# Patient Record
Sex: Male | Born: 1935 | Race: White | Hispanic: No | Smoking: Former smoker
Health system: Southern US, Community
[De-identification: ages and names within clinical notes are randomized; demographics above are authoritative.]

## PROBLEM LIST (undated history)

## (undated) DIAGNOSIS — IMO0001 Reserved for inherently not codable concepts without codable children: Secondary | ICD-10-CM

## (undated) DIAGNOSIS — I350 Nonrheumatic aortic (valve) stenosis: Secondary | ICD-10-CM

## (undated) DIAGNOSIS — M1990 Unspecified osteoarthritis, unspecified site: Secondary | ICD-10-CM

## (undated) DIAGNOSIS — L03116 Cellulitis of left lower limb: Secondary | ICD-10-CM

## (undated) DIAGNOSIS — I219 Acute myocardial infarction, unspecified: Secondary | ICD-10-CM

## (undated) DIAGNOSIS — Z9289 Personal history of other medical treatment: Secondary | ICD-10-CM

## (undated) DIAGNOSIS — R011 Cardiac murmur, unspecified: Secondary | ICD-10-CM

## (undated) DIAGNOSIS — M79606 Pain in leg, unspecified: Secondary | ICD-10-CM

## (undated) DIAGNOSIS — G709 Myoneural disorder, unspecified: Secondary | ICD-10-CM

## (undated) DIAGNOSIS — K439 Ventral hernia without obstruction or gangrene: Secondary | ICD-10-CM

## (undated) DIAGNOSIS — R351 Nocturia: Secondary | ICD-10-CM

## (undated) DIAGNOSIS — N400 Enlarged prostate without lower urinary tract symptoms: Secondary | ICD-10-CM

## (undated) DIAGNOSIS — E785 Hyperlipidemia, unspecified: Secondary | ICD-10-CM

## (undated) DIAGNOSIS — K269 Duodenal ulcer, unspecified as acute or chronic, without hemorrhage or perforation: Secondary | ICD-10-CM

## (undated) DIAGNOSIS — L03032 Cellulitis of left toe: Secondary | ICD-10-CM

## (undated) DIAGNOSIS — K219 Gastro-esophageal reflux disease without esophagitis: Secondary | ICD-10-CM

## (undated) DIAGNOSIS — K432 Incisional hernia without obstruction or gangrene: Secondary | ICD-10-CM

## (undated) DIAGNOSIS — I2510 Atherosclerotic heart disease of native coronary artery without angina pectoris: Secondary | ICD-10-CM

## (undated) HISTORY — PX: APPENDECTOMY: SHX54

## (undated) HISTORY — PX: PROSTATE BIOPSY: SHX241

## (undated) HISTORY — PX: TONSILLECTOMY: SUR1361

## (undated) HISTORY — DX: Nonrheumatic aortic (valve) stenosis: I35.0

## (undated) HISTORY — PX: ABDOMINAL EXPLORATION SURGERY: SHX538

## (undated) HISTORY — PX: CAROTID ENDARTERECTOMY: SUR193

## (undated) HISTORY — DX: Atherosclerotic heart disease of native coronary artery without angina pectoris: I25.10

## (undated) HISTORY — PX: CATARACT EXTRACTION W/ INTRAOCULAR LENS  IMPLANT, BILATERAL: SHX1307

## (undated) HISTORY — DX: Hyperlipidemia, unspecified: E78.5

---
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
## 1970-05-26 HISTORY — PX: CHOLECYSTECTOMY: SHX55

## 1998-04-10 ENCOUNTER — Ambulatory Visit (HOSPITAL_COMMUNITY): Admission: RE | Admit: 1998-04-10 | Discharge: 1998-04-10 | Payer: Self-pay | Admitting: General Surgery

## 2001-05-26 HISTORY — PX: CORONARY ARTERY BYPASS GRAFT: SHX141

## 2003-04-12 ENCOUNTER — Ambulatory Visit (HOSPITAL_COMMUNITY): Admission: RE | Admit: 2003-04-12 | Discharge: 2003-04-12 | Payer: Self-pay | Admitting: *Deleted

## 2003-04-12 ENCOUNTER — Encounter (INDEPENDENT_AMBULATORY_CARE_PROVIDER_SITE_OTHER): Payer: Self-pay | Admitting: Specialist

## 2003-06-12 ENCOUNTER — Encounter: Admission: RE | Admit: 2003-06-12 | Discharge: 2003-06-20 | Payer: Self-pay | Admitting: Family Medicine

## 2004-04-01 ENCOUNTER — Encounter (INDEPENDENT_AMBULATORY_CARE_PROVIDER_SITE_OTHER): Payer: Self-pay | Admitting: *Deleted

## 2004-04-02 ENCOUNTER — Inpatient Hospital Stay (HOSPITAL_COMMUNITY): Admission: AD | Admit: 2004-04-02 | Discharge: 2004-04-07 | Payer: Self-pay | Admitting: Cardiology

## 2004-04-03 ENCOUNTER — Encounter (INDEPENDENT_AMBULATORY_CARE_PROVIDER_SITE_OTHER): Payer: Self-pay | Admitting: Cardiology

## 2004-07-05 ENCOUNTER — Ambulatory Visit: Payer: Self-pay | Admitting: Internal Medicine

## 2004-08-12 ENCOUNTER — Ambulatory Visit: Payer: Self-pay | Admitting: Internal Medicine

## 2005-11-06 ENCOUNTER — Inpatient Hospital Stay (HOSPITAL_COMMUNITY): Admission: AD | Admit: 2005-11-06 | Discharge: 2005-11-23 | Payer: Self-pay | Admitting: Internal Medicine

## 2007-03-16 IMAGING — CR DG ABDOMEN 2V
2 series · 2 of 2 positions shown · non-contrast
Comparison: 11/09/05.

CLINICAL DATA: 77-year-old with nausea vomiting and dehydration.  Abdominal pain.
 ABDOMEN ? 2 VIEW:

[w abdomen upright]
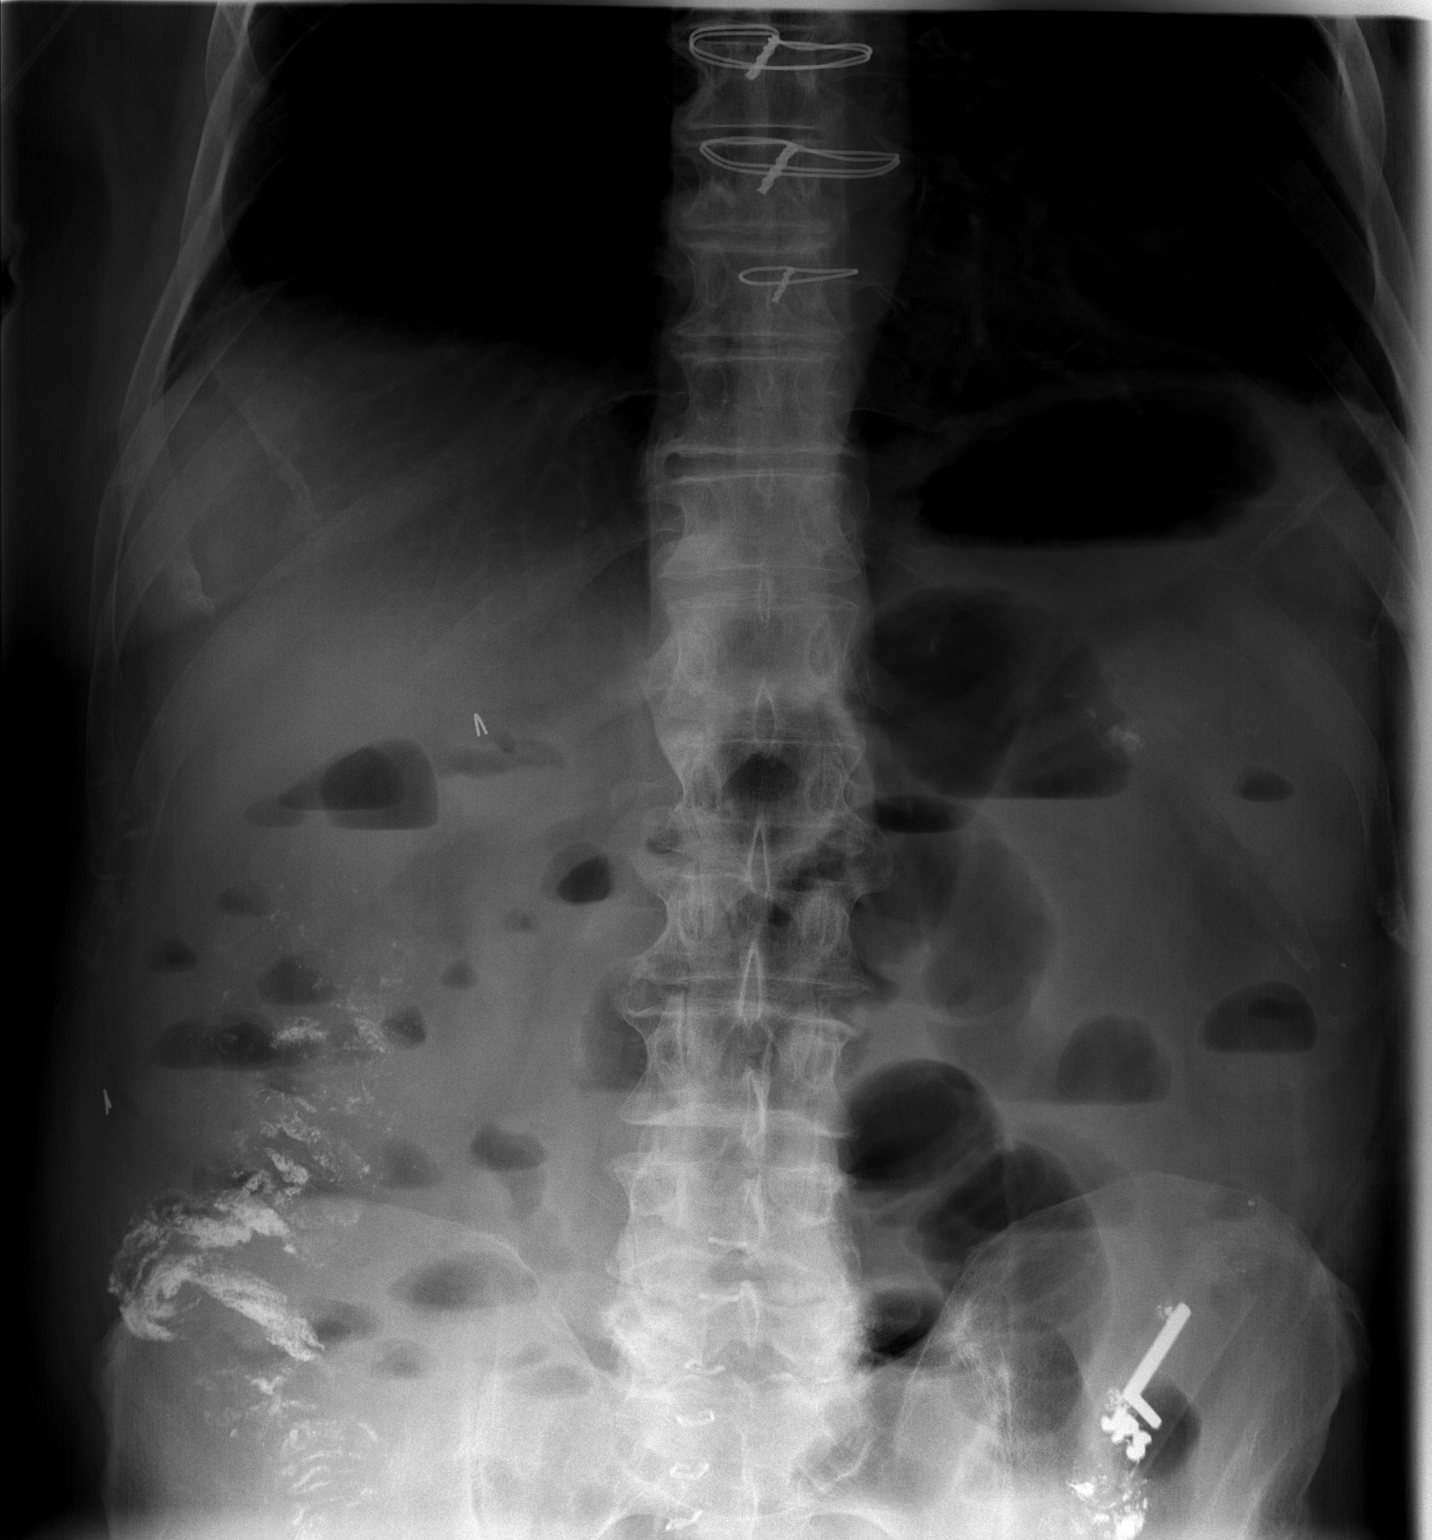

[t abdomen supine]
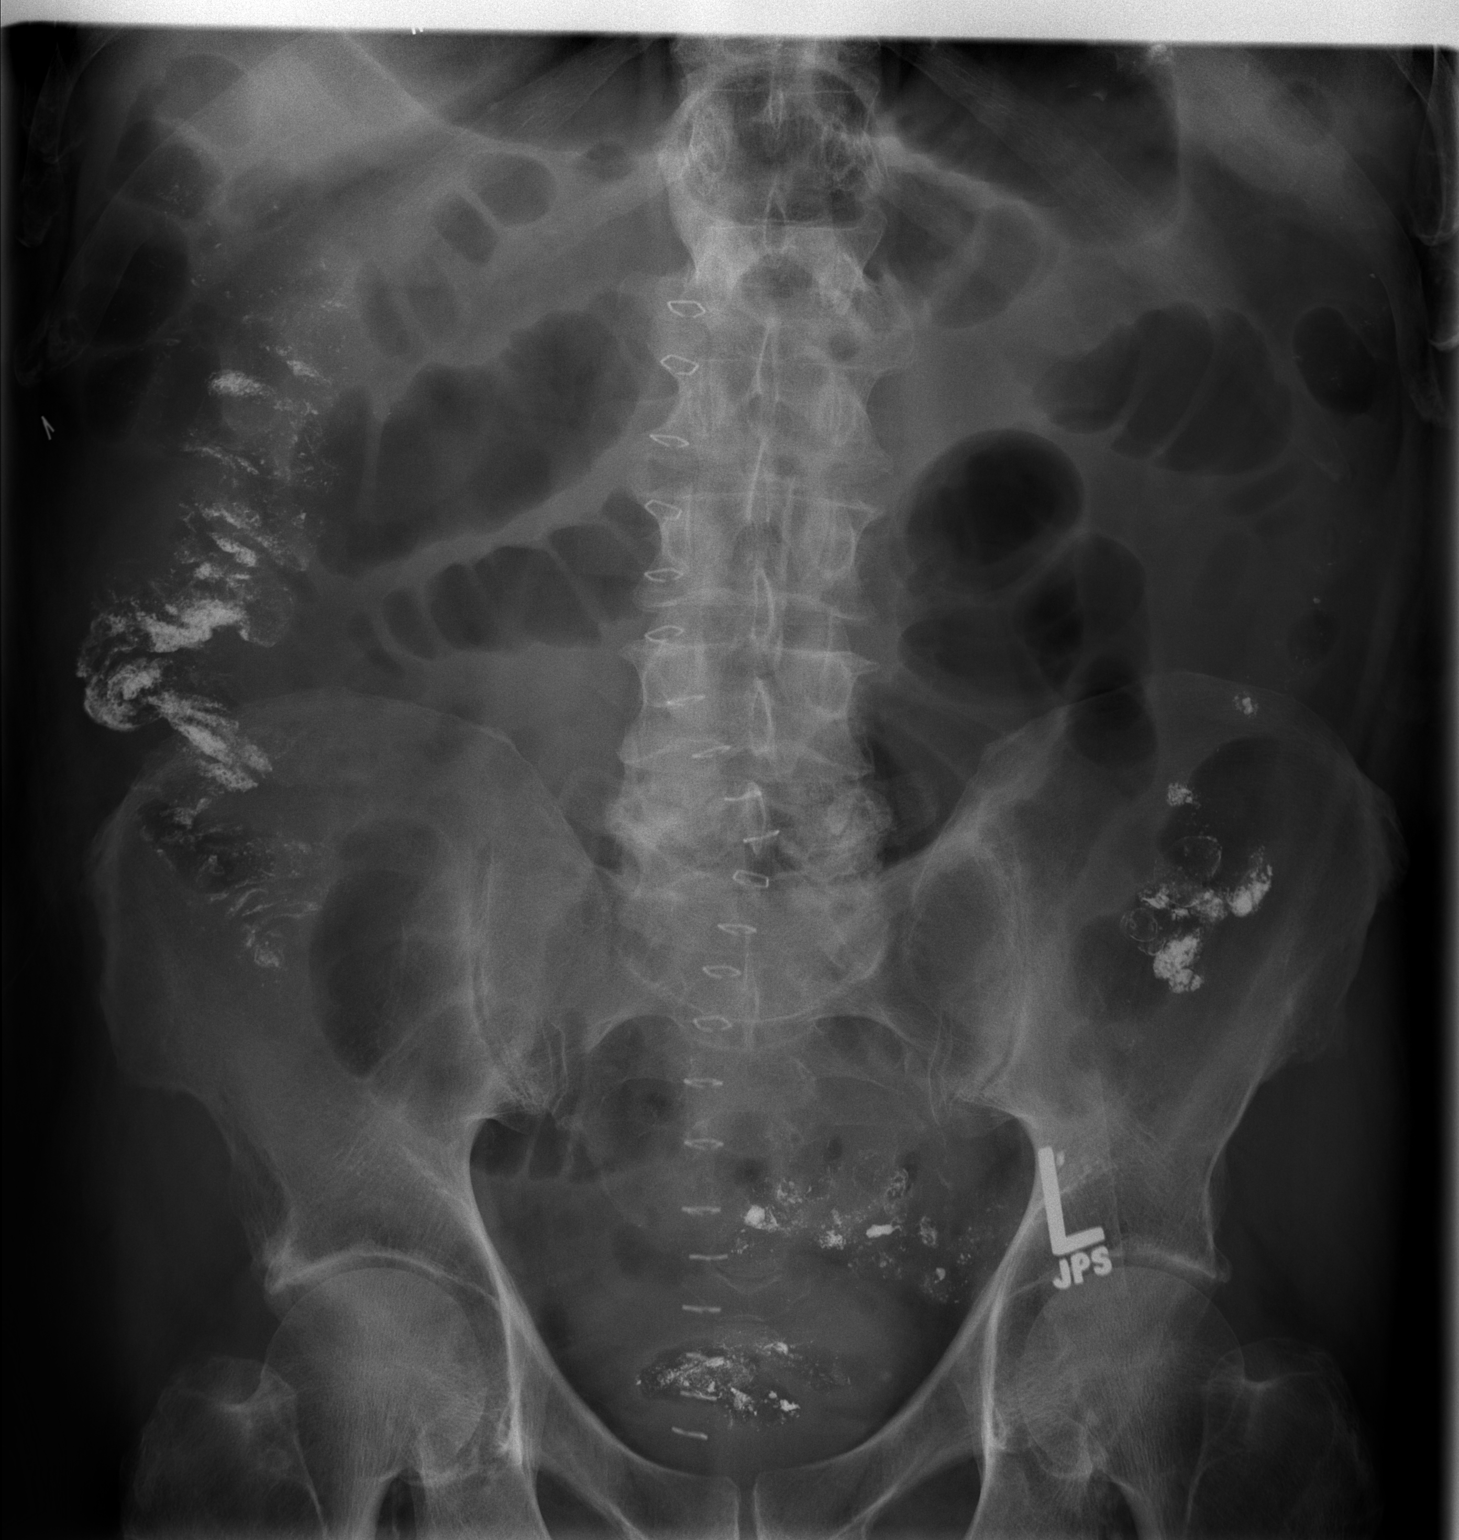

[2 of 2 positions shown; findings below may reference images not displayed]

FINDINGS: There is scattered residual contrast in the colon from a barium enema.  There are air-filled loops of small bowel with air fluid levels.  No significant distention but findings suspicious for an early small bowel obstruction.  No free air.
IMPRESSION: Air-filled loops of small bowel with air fluid levels suggesting early small bowel obstruction.

## 2007-03-18 IMAGING — CR DG ABDOMEN 2V
2 series · 2 of 2 positions shown · non-contrast
Comparison: 11/19/05.

CLINICAL DATA: Nausea, vomiting and diarrhea.  
 ABDOMEN ? 2 VIEW:

[w abdomen upright]
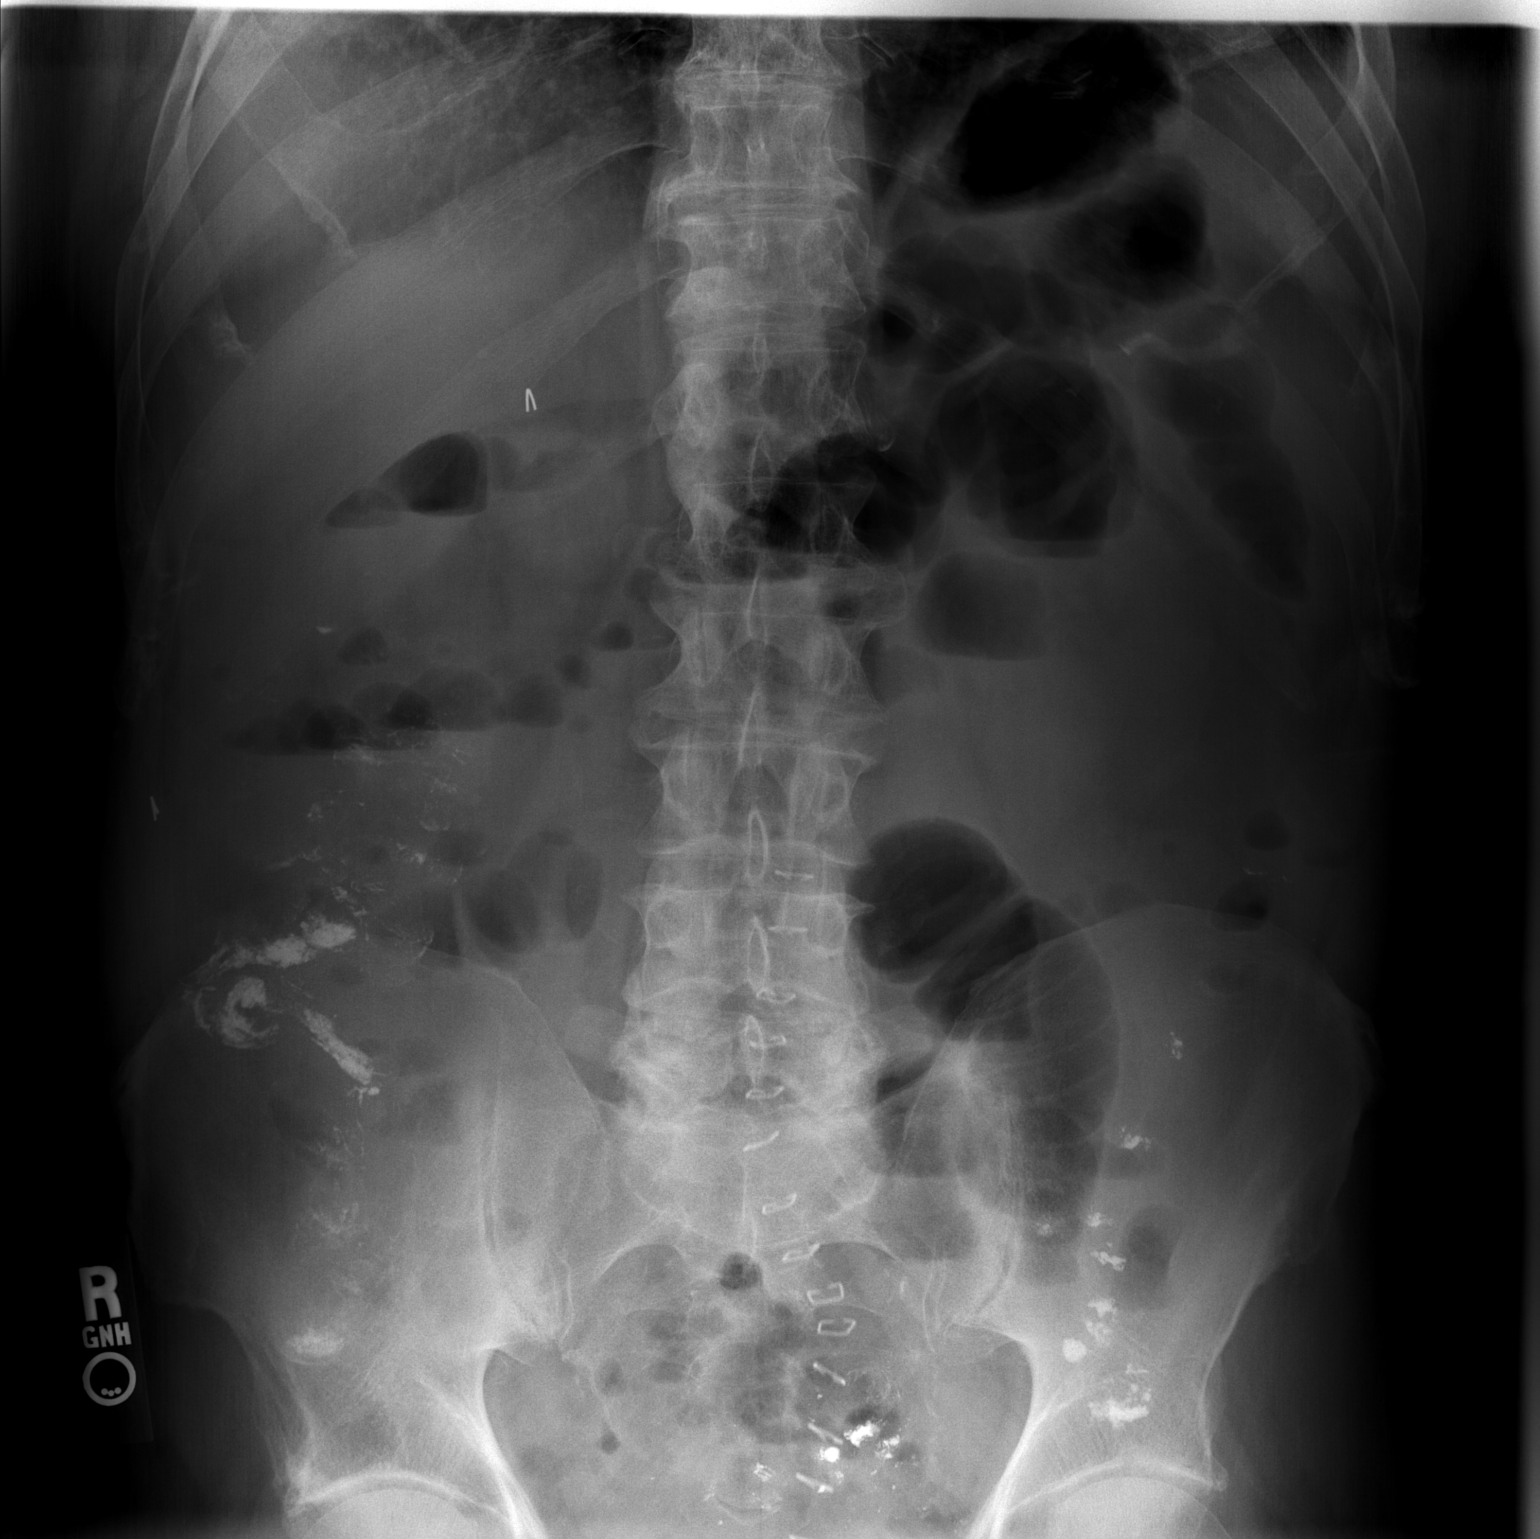

[t abdomen supine]
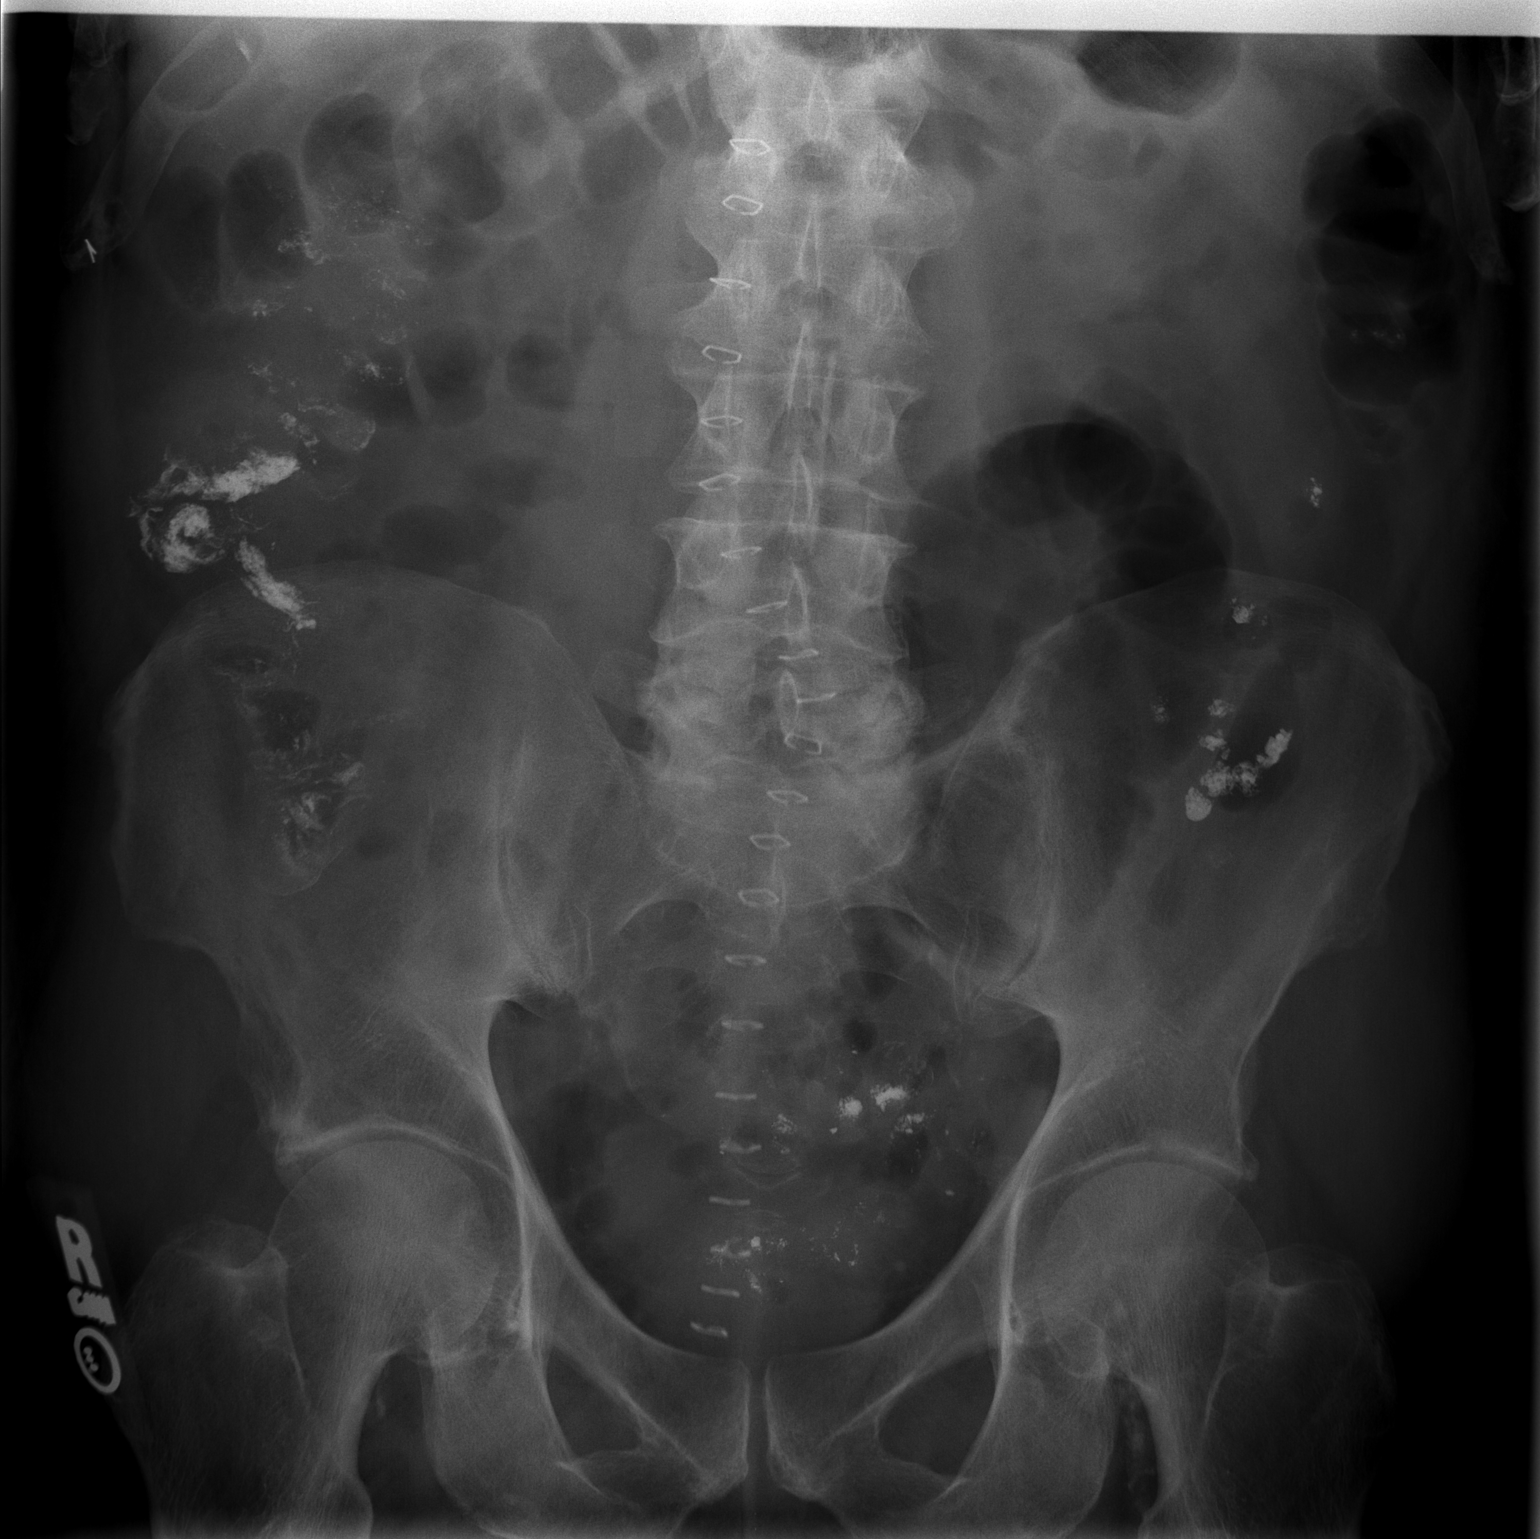

[2 of 2 positions shown; findings below may reference images not displayed]

FINDINGS: Dilated small bowel loops suggestive of small bowel obstruction on the prior exam of 11/19/05.
 There are some persistent slightly dilated small bowel loops in the midabdomen.  There are also some small air fluid levels in the nondistended colon.  Barium persists in distal small bowel and in the colon, unchanged.  This appearance is more typical for an ileus, given the lack of movement of the contrast in the nondistended colon.
IMPRESSION: Probable ileus.

## 2008-12-06 ENCOUNTER — Encounter: Admission: RE | Admit: 2008-12-06 | Discharge: 2009-01-09 | Payer: Self-pay | Admitting: Family Medicine

## 2010-10-11 NOTE — Consult Note (Signed)
NAMECAMAR, GUYTON                 ACCOUNT NO.:  [id number]   MEDICAL RECORD NO.:  [id number]          PATIENT TYPE:  INP   LOCATION:  6738                         FACILITY:  [facility]   PHYSICIAN:  Clovis Pu. Cornett, M.D.DATE OF BIRTH:  04/24/1929   DATE OF CONSULTATION:  11/08/2005  DATE OF DISCHARGE:                                   CONSULTATION   CHIEF COMPLAINT:  Abdominal pain, nausea and vomiting.   REASON FOR CONSULTATION:  Abdominal pain, nausea and vomiting, bowel  obstruction.   HISTORY OF PRESENT ILLNESS:  The patient is a 77 year old male admitted on  November 06, 2005 due to a 1 week history of nausea, vomiting, and obstipation.  The patient denies any significant abdominal pain but does have significant  distention with nausea and vomiting. He was found to have findings  concerning for a partial small bowel obstruction. I was asked to see the  patient at the request of Dr. Nehemiah Settle for this. Currently, he denies any  abdominal pain. His biggest complaint is no bowel movement for over 2 weeks  he states. His son is with him today and states that he has been a little  more confused in the hospital and that at baseline, he is lucid and very  active. He denies any abdominal pain to me today. He denies any passage of  flatus. NG tube is in place and there is bilious drainage from it. The  nausea and vomiting are made better by the NG tube. There is no associated  symptom with it. NG tube is also helping with that.   PAST MEDICAL HISTORY:  He has a past history of CABG one year ago,  dyslipidemia, and hypertension.   PAST SURGICAL HISTORY:  1.  CABG.  2.  History of open cholecystectomy.  3.  History of a periumbilical hernia, not operated on.   ADMISSION MEDICATIONS:  Include Lipitor, Doxazosin and [place] patch.   SOCIAL HISTORY:  Denies tobacco use. Does drink at least 2 ounces a day.  Denies any drug use. His son is with him today.   REVIEW OF SYSTEMS:  Positive for  nausea and vomiting. Denies any abdominal  pain. Otherwise, general review of systems is negative.   FAMILY HISTORY:  Positive for heart disease.   ALLERGIES:  NO KNOWN DRUG ALLERGIES.   PHYSICAL EXAMINATION:  VITAL SIGNS:  Temperature 97.4, blood pressure  126/81, heart rate 80. Urine output is not recorded.  GENERAL:  White male in no apparent distress.  HEENT:  The patient is wearing glasses. No evidence of scleral icterus.  Oropharynx, has a nasogastric tube in his nose. Moist mucous membranes.  NECK:  Supple, non-tender. Trachea midline. No mass.  CHEST:  Clear to auscultation. Chest wall motion is normal.  CARDIOVASCULAR:  Regular rate and rhythm. Without murmur, rub, or gallop.  Peripheral perfusion is good.  ABDOMEN:  Distended, tympanitic. No rebound, no guarding, no mass lesion, no  evidence of hernia around the umbilicus. Previous upper abdominal scar  noted. Bowel sounds are absent.  GENITOURINARY/RECTAL:  Deferred currently.  EXTREMITIES:  Muscle  tone normal. Range of motion  is normal.  NEUROLOGIC:  The patient seems slightly confused but knows where he is. He  is able to answer questions appropriately, though. Motor and sensory  functions are grossly intact.   DIAGNOSTIC STUDIES:  I reviewed his flat plate films of his abdomen from  yesterday and today. There is some mild small bowel dilatation. Otherwise,  there is stool and air in the colon on both films.   LABORATORY DATA:  CBC from today reveals a white count of 7,200. Hemoglobin  15. Platelet count 269,000. CMP revealed a creatinine of 1.6, bilirubin of  1.8, alkaline phosphatase 61. AST and ALT of 51 and 80. Chloride 97, CO2 28,  glucose 105, BUN 34, sodium 133, potassium 4.5.   IMPRESSION:  1.  Possible small bowel obstruction, versus partial obstruction, versus      colonic obstruction, versus obstipation.  2.  Acute renal failure secondary to above.   PLAN:  I agree with IV hydration and nasogastric  tube. He may require a  Foley catheter for better I's and O's. Strict I's and O's to see what his  input and output's are and a close watch of his electrolytes. I will review  his CT once it is complete today and make further decisions once that is  done. Otherwise, he has really no acute surgical indication for surgery,  unless something changes on his exam or CT scan shows something more  worrisome.   Thank you for this consultation.      Thomas A. Cornett, M.D.  Electronically Signed     TAC/MEDQ  D:  11/08/2005  T:  11/08/2005  Job:  [id number]   cc:   Deirdre Peer. Polite, M.D.

## 2010-10-11 NOTE — Discharge Summary (Signed)
NAMESNYDER, COLAVITO                 ACCOUNT NO.:  [id number]   MEDICAL RECORD NO.:  [id number]          PATIENT TYPE:  INP   LOCATION:  6738                         FACILITY:  [facility]   PHYSICIAN:  Leonie Man, M.D.   DATE OF BIRTH:  [birth year]   DATE OF ADMISSION:  11/06/2005  DATE OF DISCHARGE:  11/23/2005                                 DISCHARGE SUMMARY   OPERATIVE PHYSICIAN:  Dr. Abbey Chatters.   PRIMARY CARE PHYSICIAN:  Dr. Doran Clay   CHIEF COMPLAINT AND REASON FOR ADMISSION:  Jesus [name] is a 77 year old patient  with history of ischemia, coronary artery disease, followed by Dr. Armanda Magic, has history of recurrent CABG in __________ admitted with complaints  of __________.  These symptoms began this Saturday night prior to arrival.  He had multiple episodes of emesis and diarrhea.  No fevers or sick  contacts. He finally followed up with primary physician Monday, treated as  if he had a viral illness _________.  The patient continued with symptoms  and presented to the ER with complaints of feeling weak and light-headed .  At the ER, he was afebrile, vital signs were stable.  His exam was  unremarkable.  He was admitted with a interim  diagnosis of viral  gastroenteritis with associated vomiting versus __________.   HOSPITAL COURSE:  The patient was admitted to the __________ floor by  medicine services on the date noted, made n.p.o. and IV fluid hydration was  initiated.  His initial creatinine with fluid hydration was 6.5 down to  __________.  He had some mild transaminitis  with a normal total bilirubin, suggestive of volume depletion.  Plain x-rays  were obtained and showed a pattern consistent with a small bowel obstruction  and by November 08, 2005, this pattern continued.  The patient's abdomen  remained distended with diminished bowel sounds, tympanitic to percussion.  He still demonstrated mild transaminitis, but the LFTs had trended down and  stable.  He was not  having any abdominal pain, but was still having problems  with __________.  Because of his small bowel obstruction, surgical service  was consulted.  Dr. Luisa Hart saw the patient.  The patient reported to Dr.  Luisa Hart that he had a 2 week history of obstipation, nausea, and vomiting.  On his examination the patient had an NG tube in place with bilious  drainage.  His plain films were reviewed that showed air __________ colon  and some moderate bowel dilatation with normal white count at 7600.  CT scan  was pending at time of Dr. Rosezena Sensor evaluation.   A CT scan demonstrated dilated small bowel loops within the pelvis, signs of  severe sigmoid diverticulosis without evidence of diverticulitis.  There was  noted to be transition __________ in the right lower abdomen.  No mass or  inflammatory process was identified.  There was no free air or free fluid.  Because of these findings, a barium enema x-ray was ordered to clarify any  possible mass or other abnormalities.  There seemed to be colonic strictures  in the patient's colon,  possibly due to diverticulitis or cystic adhesion.  There were also 2 areas of apple core-like lesions that were suggestive of a  possible constrictive lesion in the mid right colon.  The patient otherwise  was stable.  His abdomen was distended.  NG tube now had coffee ground  emesis.  Prealbumin was checked and this was 8, and his LFTs had trended  back down to normal after hydration.  Because of the low prealbumin, TNA had  also been started, especially with concern that the patient would __________  because of the abnormal findings on CT and barium enema x-ray and __________  symptoms __________.   On _________, the patient continued to have significant NG output.  __________. Dr. Abbey Chatters sat down with the patient and his family and with  results of the patient's x-ray and agreed to proceed with exploratory  laparoscopy with expected bowel resection and  possible colostomy due to the  findings of the possible __________.  Risks and benefits of the procedure  were explained to the patient and they agreed.   On November 11, 2005, the patient was taken to the OR with a preoperative  diagnosis of bowel obstruction and postoperative diagnosis of bowel  obstruction and underwent an exploratory laparoscopy with lysis of  adhesions.  Despite the findings on barium enema x-ray  that was suggestive  of mass, Dr. Abbey Chatters with the assistance of Dr. Violeta Gelinas, were  unable to find any evidence of a large mass.  He underwent the operative  procedure, which included aggressive examination and palpation of the large  bowel.  Please see Dr. Maris Berger note for details.   Postoperatively the patient was slow to progress.  Essentially he developed  a postoperative ileus with leukocytosis without fever.  He was continued on  __________ IV for empiric antibiotic coverage.  By postop day 5, the  patient's abdomen was soft and flat, he had distant bowel sounds.  The night  before he had ambulated and had a liquid green bowel movement, so a trial  was __________ .  By postop day 6, the patient was tolerating __________  bowel movement.  He was started on a clear liquid diet with plans to advance  to full liquid diet that afternoon as tolerated.  The patient remained on  TNA.  Prealbumin _________.  On exam, abdomen was soft, bowel sounds  present.  __________.   By postop day 7, it was noted the patient had multiple episodes of emesis in  the night.  He still was having stools .  After talking with the patient's  son, the patient had eaten a large volume of full liquid diet and had become  nauseated __________,  The patient was placed on sips of clear liquid with  plans to slowly advance his diet.  He was still on TPA for pain control and was not changed over to oral pain medications until the patient was able to  tolerate a diet.  Abdominal x-ray was  also checked to make sure the patient  had not developed another ileus or small bowel obstruction.   By postop day 8, the patient's white count was 10,600, sodium had increased  to 132 , BUN was back down and creatinine stable.  His abdomen was  __________ soft with bowel sounds present.  __________.  The abdominal x-ray  shows a small amount of air fluid level.  Ileus had improved previous, was  continued on clear liquids for 24 hours.  By postop  day 9, the exam was  consistent with resolving ileus.  Abdomen remained soft, nondistended.  He  was having  bowel movements.  Bowel sounds were present.  He was advanced to  full liquid diet.  __________.  Because of the liquid stools, C. diff toxins  were checked to rule out infectious diarrhea.  C. diff was negative x3.  Repeat x-rays again showed air fluid levels, but decreasing in number and  degree of distention.   Over the next 2 days, the patient's diet was slowly advanced.  His exam  remained  unremarkable __________ and by November 23, 2005, which would be postop  day 11, the patient's exam was unremarkable.  He was tolerating a regular  diet __________ .  He was ambulating without difficulty.  He was afebrile  and __________ home.   FINAL DISCHARGE DIAGNOSES:  1. Small bowel obstruction secondary to adhesions.  2. Status post exploratory laparotomy with lysis of adhesions, per Dr.      Abbey Chatters.  3. Protein calorie malnutrition __________ prealbumin 12.1 _________.  4. Hypertension, controlled.  5. Postoperative ileus, resolved.   DISCHARGE MEDICATIONS:  1. Vicodin 5/500 one to two tabs every 4-6 hours for pain.  2. Aleve or Naprosyn over-the-counter 1 to 2 tabs 3 times a day with food.  __________  Resume any preoperative medications.   DIET:  No restrictions.   ACTIVITY:  Increase activity slowly.  May shower.  No lifting for 4 weeks.  Wound care daily __________   ADDITIONAL INSTRUCTIONS:  Restrict lifting to 15 pounds for  four week..  No  driving or operating machinery while taking Vicodin.   FOLLOWUP:  Will need to call Dr.  Maris Berger office at [phone] to  schedule an appointment to be seen in the next 2-3 weeks.      Jesus [name], N.P.      Leonie Man, M.D.     ALE/MEDQ  D:  12/09/2005  T:  12/09/2005  Job:  [id number]   cc:   Adolph Pollack, M.D.  Al [place]. Janey Greaser, MD

## 2010-10-11 NOTE — Op Note (Signed)
NAMELONDEN, BOK                 ACCOUNT NO.:  [id number]   MEDICAL RECORD NO.:  [id number]          PATIENT TYPE:  INP   LOCATION:  6738                         FACILITY:  [facility]   PHYSICIAN:  Adolph Pollack, M.D.DATE OF BIRTH:  [birth year]   DATE OF PROCEDURE:  11/11/2005  DATE OF DISCHARGE:                                 OPERATIVE REPORT   PREOPERATIVE DIAGNOSIS:  Bowel obstruction.   POSTOPERATIVE DIAGNOSIS:  Small bowel obstruction secondary adhesions.   PROCEDURE:  Exploratory laparotomy, lysis of adhesions.   SURGEON:  Adolph Pollack, M.D.   ASSISTANT:  Violeta Gelinas, M.D.   ANESTHESIA:  General.   INDICATIONS:  This is a 77 year old male, admitted November 06, 2005 with a  small bowel obstruction.  There was some question about whether he may have  a large bowel lesion leading to the obstruction and he underwent a barium  enema which suggested he may have an annular lesion in the mid ascending  colon.  He is now brought to the operating room.  We have discussed the  procedure and the risks including, but not limited to, bleeding, infection,  wound, healing problems, anesthesia, possibility of bowel resection with a  colostomy, anastomotic leak, accidental damage to intra-abdominal organs.   TECHNIQUE:  He was brought to the operating room, placed supine on the  operating table and a general anesthetic was administered.  A Foley catheter  placed in the bladder.  Hair from the abdominal wall was clipped and the  area was sterilely prepped and draped.  A midline incision was made incising  the skin, subcutaneous tissue, fascia and peritoneum.  On entering the  peritoneal cavity, I noticed dilated small intestine.  Omental adhesions  from a previous right upper costal incision to the anterior abdominal wall  were taken down with the cautery.  I then eviscerated part of the small  bowel.  What I found was 2 areas of band adhesions leading to the points of  bowel  obstruction and I lysed these, releasing the obstruction.  There was  basically normal-caliber small bowel distal to these points and dilated  small bowel that was viable proximal to these point.  I then approached the  right colon and mobilized this by incising the white line of Toldt up to the  hepatic flexure.  There was no constricting lesion noted.  The right colon  was soft without obvious evidence of a mass when we palpated it.  I  subsequently spoke with Dr. Maryelizabeth Rowan of Radiology and asked him to  review the x-rays.  He said he did not see a typical mucosal destruction on  the barium study that he would have expected and a coronal view of a CT done  earlier, he did see any evidence of the lesion, which he thought he should  see, given its appearance.  He said this could be stool or spasm.   Following this, then I irrigated out the abdominal cavity.  Small intestinal  contents were was milked back into the stomach and then evacuated by way of  NG  tube.  We were then able to put the bowel back into the abdominal cavity.  Sponge counts and instrument counts were reported to be correct.  The fascia  was then closed with a running #1 PDS suture.  The subcutaneous tissue was  irrigated and the skin closed with staples.   He tolerated the procedure well without any apparent complications and he  was taken to the recovery room in satisfactory condition.      Adolph Pollack, M.D.  Electronically Signed     TJR/MEDQ  D:  11/11/2005  T:  11/12/2005  Job:  [id number]

## 2010-10-11 NOTE — Cardiovascular Report (Signed)
[name], [name]                 ACCOUNT NO.:  [id number]   MEDICAL RECORD NO.:  [id number]          PATIENT TYPE:  OIB   LOCATION:  2888                         FACILITY:  [facility]   PHYSICIAN:  Armanda Magic, M.D.     DATE OF BIRTH:  Oct 10, 1928   DATE OF PROCEDURE:  04/01/2004  DATE OF DISCHARGE:                              CARDIAC CATHETERIZATION   REFERRING PHYSICIAN:  Dr. Miguel Aschoff.   PROCEDURES:  1.  Left heart catheterization.  2.  Coronary angiography.  3.  Left ventriculography.   CARDIOLOGIST:  Armanda Magic, M.D.   INDICATIONS:  Chest pain and shortness of breath.   COMPLICATIONS:  None.   IV ACCESS:  Via right femoral artery, 6-French sheath.   This is a 77 year old white male with a previous history of MI in 1981,  apparently had a cardiac catheterization at that time which showed minimal  blockage, and he was treated with medications.  He now presents with  exertional shortness of breath and chest pain and a Cardiolite showing  partially fixed defect in the inferolateral wall with a large area of  reversible ischemia.   The patient was brought to the cardiac catheterization laboratory in a  fasting, non-sedated state.  Informed consent was obtained.  The patient was  connected to continuous heart rate and pulse oximetry monitoring,  intermittent blood pressure monitoring.  The right groin was prepped and  draped in a sterile fashion.  Xylocaine 1% was used for local anesthesia.  Using a modified Seldinger technique, a 6-French sheath was placed in the  right femoral artery.  Under fluoroscopic guidance, a 6-French JL4 catheter  was placed in the left coronary artery.  Multiple sine films were taken, 30  degree RAO, 40 degree LAO views.  This catheter was then exchanged out over  a guidewire for a 6-French JR4 catheter which was placed under fluoroscopic  guidance to the right coronary artery.  Multiple sine films were taken at 40  degree LAO view.  When the  patient was positioned for the 30 degree RAO  view, the catheter popped out and could not be re-engaged in the coronary  ostium.  The catheter was exchanged out for a 6-French no-toe catheter which  again could not cannulate the coronary ostium.  The catheter was removed  over a guidewire, and a 6-French angled pigtail catheter was placed under  fluoroscopic guidance in the left ventricular cavity.  Left ventriculography  was performed in 30 degree RAO view using a total of 30 ml at 15 ml per  second.  The catheter was then pulled back across the aortic valve with no  significant gradient.  At the end of the procedure, all catheters and  sheaths were removed.  Manual pressure was performed until adequate  hemostasis was obtained.  The patient was transferred back to her room in  stable condition.   RESULTS:  1.  Left main coronary artery distally had a 40% narrowing and then      bifurcates into a left anterior descending artery and left circumflex  artery.  The left anterior descending artery has a proximal 70%      narrowing at the takeoff of the very large diagonal #1 branch.  The      diagonal was widely patent.  Distal to the take off of the diagonal,      there is a 70 to 80% mid LAD stenosis, and the LAD then traverses to the      apex and is widely patent.  The left circumflex is widely patent      throughout its course, gives rise to a first OM-1 which is occluded      proximally with evidence of left-to-left collaterals filling the distal      OM-1.  There is a second OM that is widely patent.  2.  The right coronary artery is diffusely diseased up to 50%.  It      bifurcates into a posterior descending artery and posterolateral artery.  3.  Left ventriculography shows a focal area of inferolateral akinesis, EF      50%, aortic pressure 135/65 mmHg, left ventricular pressure 143/14 mmHg.      LV EDP 20 mmHg.   ASSESSMENT:  1.  Two-vessel obstructive coronary disease with  mild left main disease.  2.  Low-normal left ventricular systolic function with inferolateral      akinesis.  3.  Chest pain.   PLAN:  1.  CVTS consult.  The films were reviewed with Dr. Katrinka Blazing.  He felt there      was high risk of percutaneous coronary intervention to the LAD given      that the proximal lesion occurred at the take off of this very large      diagonal branch.  2.  Will start the patient on aspirin daily and get a CVTS consult.       TT/MEDQ  D:  04/01/2004  T:  04/01/2004  Job:  [id number]   cc:   C. Duane Lope, M.D.  [address]  [place]  [place] [address]  Fax: [phone]

## 2010-10-11 NOTE — H&P (Signed)
Jesus [name], Jesus [name]                 ACCOUNT NO.:  [id number]   MEDICAL RECORD NO.:  [id number]          PATIENT TYPE:  INP   LOCATION:  6738                         FACILITY:  [facility]   PHYSICIAN:  Deirdre Peer. Polite, M.D. DATE OF BIRTH:  [birth year]   DATE OF ADMISSION:  11/06/2005  DATE OF DISCHARGE:                                HISTORY & PHYSICAL   CHIEF COMPLAINT:  Nausea and vomiting.   HISTORY OF PRESENT ILLNESS:  A 77 year old male with a known history of  dyslipidemia, coronary artery disease, status post CABG, who was directly  admitted to the hospital for above chief complaint of nausea and vomiting.  According to the patient, he has not been feeling well since eating dinner  on Saturday night.  Patient stated that he ate some hamburger and later that  night had profuse nausea followed by several bouts of emesis.  He denies any  bloody emesis.  Denies any diarrhea.  Denies any abdominal pain but  continues to have nausea and vomiting.  The patient saw his primary MD on  Monday.  Was treated conservatively with antiemetic.  Despite that, the  patient continued with the above symptoms.  Now is complaining of feeling  weak and lightheaded.   Patient's labs drawn on Monday showed some azotemia with a creatinine of  approximately 1.8.  Because of the persistent nausea, vomiting, and  azotemia, admission was recommended for further evaluation and treatment.   PAST MEDICAL HISTORY:  As stated above.   MEDICATIONS ON ADMISSION:  Include Lipitor, doxazosin, transdermal  scopolamine patch.   SOCIAL HISTORY:  Negative for tobacco.  Positive for alcohol, at least 2  ounces daily.  No drugs.   PAST SURGICAL HISTORY:  Significant for a CABG and a cholecystectomy in the  past.   ALLERGIES:  None.   FAMILY HISTORY:  Mother deceased from heart problems as well as father  deceased from heart problems.   REVIEW OF SYSTEMS:  As stated in HPI.   PHYSICAL EXAMINATION:  VITAL SIGNS:   Temp 98.8, pulse 98, BP 131/92,  respiratory rate 20, satting at 92% on room air.  GENERAL:  Patient is alert and oriented x3.  HEENT:  Within normal limits.  NECK:  Supple.  No adenopathy.  CHEST:  Clear.  CARDIOVASCULAR:  Regular.  ABDOMEN: Soft and nontender without mass.  EXTREMITIES:  No edema.   Data is pending at the time of this dictation.   ASSESSMENT:  1.  Persistent nausea and vomiting.  2.  Coronary artery disease, status post coronary artery bypass graft.   RECOMMENDATIONS:  Patient is to be admitted to a medicine floor bed.  Patient will be provided with IV fluids, antiemetics.  Patient's labs will  be checked, CMET, amylase, and lipase.  We will obtain an abdominal series  and will make further recommendations after review of those studies.   Differential diagnosis at this time is viral gastroenteritis versus food  poisoning.      Deirdre Peer. Polite, M.D.  Electronically Signed     RDP/MEDQ  D:  11/06/2005  T:  11/06/2005  Job:  [id number]   cc:   Al Decant. Janey Greaser, MD  Fax: [phone]

## 2011-12-10 ENCOUNTER — Other Ambulatory Visit: Payer: Self-pay | Admitting: Cardiology

## 2011-12-10 NOTE — H&P (Signed)
CF/cath work up.      HPI:     General:             Mr Woodfield is a 78 yo male followed by Dr Mayford Knife with a hx of CABG in 2005, mild aortic stenosis, hypertension and GERD. He was recently seen due to chest pain that had awakened him out of sleep with pain in his left axilla that was a burning pressure and had a funny taste in his mouth. He took an ASA and about 1 and 1/2 hours later it went away. This has occurred a total of 3 epsiodes, always around 1 am. Dr Mayford Knife proceeded with nuclear stress test with fixed inferior wall ischemia and mild perinfarct ischemia, EF normal. Plan to proceed with cardiac cath at this time. He had 1 reoccurance of above symptoms last friday night. He denies any SOB, palpitations, dizziness, nausea or diaphoresis.   .      ROS:      as noted in HPI, + occasional abdominal bloating and mild queeziness but not full nausea no black or bloody Bms, no fever, chills, congestion, no neurological changes. no allergy to IVP dye.     Medical History: Coronary artery disease status post coronary artery bypass grafting 11/05, mild aortic stenosis by 2-D echocardiogram in 2013, Hypertension, Dyslipidemia, moderate bilateral carotid artery stenosis followed at [facility].      Surgical History: Prostate Bx, Dr Annabell Howells .      Family History:  Father: deceased 57 yrs MI Mother: deceased 70 yrs Her pacemaker      Social History:      General: History of smoking  cigarettes: Former smoker.  no Smoking. Alcohol: yes, 3 ounces of scotch daily. no Caffeine. no Recreational drug use. no Diet. Exercise: yes. Occupation: unemployed, retired. Marital Status: single. Children: 2 children.      Medications: Aspirin 325 MG Tablet Chewable 1 tablet Once a day, Simvastatin 20 Milligram Miscellaneous Unspecified Take 1 tablet daily , Centrum Silver Tablet 1 tablet once a day, Rapaflo 8 mg caplet one daily, Saw Palmetto Capsule as directed on otc packaging , Omeprazole 20 MG Capsule Delayed  Release 1 capsule Once a day, Metanx 3-35-2 MG Tablet 1 tablet Twice a day, Lidoderm 5 % Patch 1 patch to intact skin remove after 12 hours as needed, Medication List reviewed and reconciled with the patient     Allergies: Cardura: lowered BP too low: Side Effects, Gabapentin: Weight gain: Side Effects.      Objective:    Vitals: Wt 232, Wt change -1.8 lb, Ht 70, BMI 33.28, Pulse sitting 68, BP sitting 124/66.     Examination:     Cardiology, General:         GENERAL APPEARANCE: pleasant, NAD.  HEENT: unremarkable.  CAROTID UPSTROKE: normal, no bruit.  JVD: flat.  HEART SOUNDS: regular, normal S1, S2, no S3 or S4.  MURMUR: absent.  LUNGS: no rales or wheezes.  ABDOMEN: soft, non tender, positive bowel sounds, no masses felt.  EXTREMITIES: no leg edema.  PERIPHERAL PULSES: 2 plus bilateral.            Assessment:    Assessment:  1. Chest pain - 786.50 (Primary)   2. Coronary atherosclerosis of native coronary artery - 414.01   3. Aortic valve disorders - 424.1   4. Hypertension, essential - 401.1   5. Abnormal nuclear stress test - 794.39     Plan:    1. Coronary atherosclerosis of  native coronary artery  Continue Aspirin Tablet Chewable, 325 MG, 1 tablet, Orally, Once a day ;  Continue Simvastatin Miscellaneous Unspecified, 20 Milligram, Take 1 tablet daily .       2. Abnormal nuclear stress test        LAB: CBC with Diff     WBC 5.9 4.0-11.0 - K/ul         RBC 4.14 4.20-5.80 - M/uL L       HGB 13.4 13.0-17.0 - g/dL        HCT 16.1 09.6-04.5 - %        MCH 32.5 27.0-33.0 - pg        MPV 8.9 7.5-10.7 - fL         MCV 99.6 80.0-94.0 - fL H       MCHC 32.6 32.0-36.0 - g/dL        RDW 40.9 81.1-91.4 - %        NRBC# 0.00 -        PLT 209 150-400 - K/uL        NEUT % 64.5 43.3-71.9 - %        NRBC% 0.00 - %        LYMPH% 18.1 16.8-43.5 - %        MONO % 10.2 4.6-12.4 - %        EOS % 4.2 0.0-7.8 - %         BASO % 3.0 0.0-1.0 - % H       NEUT # 3.8 1.9-7.2 - K/uL         LYMPH# 1.10 1.10-2.70 - K/uL        MONO # 0.6 0.3-0.8 - K/uL        EOS # 0.2 0.0-0.6 - K/uL         BASO # 0.2 0.0-0.1 - K/uL H              Renan Danese A 12/09/2011 05:25:47 PM > ok for cath        LAB: Basic Metabolic      GLUCOSE 111 70-99 - mg/dL H       BUN 25 7-82 - mg/dL        CREATININE 9.56 0.60-1.30 - mg/dl         eGFR (NON-AFRICAN AMERICAN) 58 >60 - calc L       eGFR (AFRICAN AMERICAN) 70 >60 - calc        SODIUM 138 136-145 - mmol/L        POTASSIUM 5.4 3.5-5.5 - mmol/L        CHLORIDE 103 98-107 - mmol/L        C02 30 22-32 - mg/dL        ANION GAP 21.3 0.8-65.7 - mmol/L        CALCIUM 9.8 8.6-10.3 - mg/dL               Marna Weniger A 12/09/2011 05:25:22 PM > ok for cath, he is not on any potassium        LAB: PT and PTT ([id number])     aPTT 28 24-33 - SEC        INR 1.0 0.8-1.2 -        Prothrombin Time 10.9 9.1-12.0 - SEC    Risks and benefits of cardiac catheterization have been reviewed including risk of stroke, heart attack, death, bleeding, renal impariment and arterial damage. There was ample oppurtuny to answer questions. Alternatives were discussed.  Patient understands and wishes to proceed.          Immunizations:       Labs:      Procedure Codes: 16109 ECL CBC PLATELET DIFF, 80048 ECL BMP, 60454 BLOOD COLLECTION ROUTINE VENIPUNCTURE     Preventive:           Follow Up: TT pending cath results (Reason: CAD, chest pain)        Provider: Michaell Cowing. Emelda Fear, NP  Patient: Jesus [name], Jesus [name]  DOB: 1929-01-22  Date: 12/09/2011

## 2011-12-11 ENCOUNTER — Encounter: Payer: Self-pay | Admitting: Cardiology

## 2011-12-11 ENCOUNTER — Other Ambulatory Visit: Payer: Self-pay | Admitting: Cardiology

## 2011-12-11 NOTE — H&P (Signed)
--------------------------------------------------------------------------------  Subjective:    CC:      1. CF/cath work up.      HPI:     General:             [name] is [name] 78 yo male followed by Dr Jesus [name] with [name] hx of CABG in 2005, mild aortic stenosis, hypertension and GERD. He was recently seen due to chest pain that had awakened him out of sleep with pain in his left axilla that was [name] burning pressure and had [name] funny taste in his mouth. He took an ASA and about 1 and 1/2 hours later it went away. This has occurred [name] total of 3 epsiodes, always around 1 am. Dr Jesus [name] proceeded with nuclear stress test with fixed inferior wall ischemia and mild perinfarct ischemia, EF normal. Plan to proceed with cardiac cath at this time. He had 1 reoccurance of above symptoms last friday night. He denies any SOB, palpitations, dizziness, nausea or diaphoresis.   .      ROS:      as noted in HPI, + occasional abdominal bloating and mild queeziness but not full nausea no black or bloody Bms, no fever, chills, congestion, no neurological changes. no allergy to IVP dye.     Medical History: Coronary artery disease status post coronary artery bypass grafting 11/05, mild aortic stenosis by 2-D echocardiogram in 2013, Hypertension, Dyslipidemia, moderate bilateral carotid artery stenosis followed at [facility].      Surgical History: Prostate Bx, Dr Jesus [name] .      Family History:  Jesus [name]: deceased 86 yrs MI Jesus [name]: deceased 83 yrs Her pacemaker      Social History:      General: History of smoking  cigarettes: Former smoker.  no Smoking. Alcohol: yes, 3 ounces of scotch daily. no Caffeine. no Recreational drug use. no Diet. Exercise: yes. Occupation: unemployed, retired. Marital Status: single. Children: 2 children.      Medications: Aspirin 325 MG Tablet Chewable 1 tablet Once [name] day, Simvastatin 20 Milligram Miscellaneous Unspecified Take 1 tablet daily , Centrum Silver Tablet 1 tablet once [name] day,  Rapaflo 8 mg caplet one daily, Saw Palmetto Capsule as directed on otc packaging , Omeprazole 20 MG Capsule Delayed Release 1 capsule Once [name] day, Metanx 3-35-2 MG Tablet 1 tablet Twice [name] day, Lidoderm 5 % Patch 1 patch to intact skin remove after 12 hours as needed, Medication List reviewed and reconciled with the patient     Allergies: Cardura: lowered BP too low: Side Effects, Gabapentin: Weight gain: Side Effects.      Objective:    Vitals: Wt 232, Wt change -1.8 lb, Ht 70, BMI 33.28, Pulse sitting 68, BP sitting 124/66.     Examination:     Cardiology, General:         GENERAL APPEARANCE: pleasant, NAD.  HEENT: unremarkable.  CAROTID UPSTROKE: normal, no bruit.  JVD: flat.  HEART SOUNDS: regular, normal S1, S2, no S3 or S4.  MURMUR: absent.  LUNGS: no rales or wheezes.  ABDOMEN: soft, non tender, positive bowel sounds, no masses felt.  EXTREMITIES: no leg edema.  PERIPHERAL PULSES: 2 plus bilateral.            Assessment:    Assessment:  1. Chest pain - 786.50 (Primary)   2. Coronary atherosclerosis of native coronary artery - 414.01   3. Aortic valve disorders - 424.1   4. Hypertension, essential - 401.1   5. Abnormal nuclear stress test -   794.39     Plan:    1. Coronary atherosclerosis of native coronary artery  Continue Aspirin Tablet Chewable, 325 MG, 1 tablet, Orally, Once [name] day ;  Continue Simvastatin Miscellaneous Unspecified, 20 Milligram, Take 1 tablet daily .       2. Abnormal nuclear stress test        LAB: CBC with Diff     WBC 5.9 4.0-11.0 - K/ul         RBC 4.14 4.20-5.80 - M/uL L       HGB 13.4 13.0-17.0 - g/dL        HCT 41.2 39.0-52.0 - %        MCH 32.5 27.0-33.0 - pg        MPV 8.9 7.5-10.7 - fL         MCV 99.6 80.0-94.0 - fL H       MCHC 32.6 32.0-36.0 - g/dL        RDW 13.8 11.5-15.5 - %        NRBC# 0.00 -        PLT 209 150-400 - K/uL        NEUT % 64.5 43.3-71.9 - %        NRBC% 0.00 - %        LYMPH% 18.1 16.8-43.5 - %        MONO % 10.2  4.6-12.4 - %        EOS % 4.2 0.0-7.8 - %         BASO % 3.0 0.0-1.0 - % H       NEUT # 3.8 1.9-7.2 - K/uL        LYMPH# 1.10 1.10-2.70 - K/uL        MONO # 0.6 0.3-0.8 - K/uL        EOS # 0.2 0.0-0.6 - K/uL         BASO # 0.2 0.0-0.1 - K/uL H              Jesus JesusJesus [name] 12/09/2011 05:25:47 PM > ok for cath        LAB: Basic Metabolic      GLUCOSE 111 70-99 - mg/dL H       BUN 25 6-26 - mg/dL        CREATININE 1.20 0.60-1.30 - mg/dl         eGFR (NON-AFRICAN AMERICAN) 58 >60 - calc L       eGFR (AFRICAN AMERICAN) 70 >60 - calc        SODIUM 138 136-145 - mmol/L        POTASSIUM 5.4 3.5-5.5 - mmol/L        CHLORIDE 103 98-107 - mmol/L        C02 30 22-32 - mg/dL        ANION GAP 10.7 6.0-20.0 - mmol/L        CALCIUM 9.8 8.6-10.3 - mg/dL               Jesus JesusJesus [name] 12/09/2011 05:25:22 PM > ok for cath, he is not on any potassium        LAB: PT and PTT (020321)     aPTT 28 24-33 - SEC        INR 1.0 0.8-1.2 -        Prothrombin Time 10.9 9.1-12.0 - SEC               Jesus JesusJesus [name] 12/10/2011 08:25:48 AM > ok for   cath   Risks and benefits of cardiac catheterization have been reviewed including risk of stroke, heart attack, death, bleeding, renal impariment and arterial damage. There was ample oppurtuny to answer questions. Alternatives were discussed. Patient understands and wishes to proceed.          Immunizations:       Labs:      Procedure Codes: 85025 ECL CBC PLATELET DIFF, 80048 ECL BMP, 36415 BLOOD COLLECTION ROUTINE VENIPUNCTURE     Preventive:          cc to EPIC.     Follow Up: TT pending cath results (Reason: CAD, chest pain)        Provider: Cynthia [name]. Ferguson, NP  Patient: Jesus [name]  DOB: 01/01/1929  Date: 12/09/2011    

## 2011-12-15 ENCOUNTER — Inpatient Hospital Stay (HOSPITAL_BASED_OUTPATIENT_CLINIC_OR_DEPARTMENT_OTHER)
Admission: RE | Admit: 2011-12-15 | Discharge: 2011-12-15 | Disposition: A | Discharge status: Home / Self Care | Payer: Medicare Other | Source: Ambulatory Visit | Attending: Cardiology | Admitting: Cardiology

## 2011-12-15 ENCOUNTER — Other Ambulatory Visit: Payer: Self-pay | Admitting: Interventional Cardiology

## 2011-12-15 ENCOUNTER — Encounter (HOSPITAL_BASED_OUTPATIENT_CLINIC_OR_DEPARTMENT_OTHER)
Admission: RE | Disposition: A | Discharge status: Home / Self Care | Payer: Self-pay | Source: Ambulatory Visit | Attending: Cardiology

## 2011-12-15 DIAGNOSIS — I2510 Atherosclerotic heart disease of native coronary artery without angina pectoris: Secondary | ICD-10-CM | POA: Insufficient documentation

## 2011-12-15 DIAGNOSIS — R9439 Abnormal result of other cardiovascular function study: Secondary | ICD-10-CM | POA: Insufficient documentation

## 2011-12-15 DIAGNOSIS — R079 Chest pain, unspecified: Secondary | ICD-10-CM | POA: Insufficient documentation

## 2011-12-15 DIAGNOSIS — I6529 Occlusion and stenosis of unspecified carotid artery: Secondary | ICD-10-CM | POA: Insufficient documentation

## 2011-12-15 DIAGNOSIS — I359 Nonrheumatic aortic valve disorder, unspecified: Secondary | ICD-10-CM | POA: Insufficient documentation

## 2011-12-15 DIAGNOSIS — I10 Essential (primary) hypertension: Secondary | ICD-10-CM | POA: Insufficient documentation

## 2011-12-15 DIAGNOSIS — E785 Hyperlipidemia, unspecified: Secondary | ICD-10-CM | POA: Insufficient documentation

## 2011-12-15 DIAGNOSIS — K219 Gastro-esophageal reflux disease without esophagitis: Secondary | ICD-10-CM | POA: Insufficient documentation

## 2011-12-15 SURGERY — JV LEFT HEART CATHETERIZATION WITH CORONARY ANGIOGRAM
Anesthesia: Moderate Sedation

## 2011-12-15 MED ORDER — CLOPIDOGREL BISULFATE 75 MG PO TABS: 75.0000 mg | ORAL_TABLET | Freq: Every day | ORAL | Status: AC

## 2011-12-15 MED ORDER — DIAZEPAM 5 MG PO TABS
5.0000 mg | ORAL_TABLET | ORAL | Status: AC
  Administered 2011-12-15: 5 mg via ORAL

## 2011-12-15 MED ORDER — SODIUM CHLORIDE 0.9 % IJ SOLN: 3.0000 mL | Freq: Two times a day (BID) | INTRAMUSCULAR | Status: DC

## 2011-12-15 MED ORDER — SODIUM CHLORIDE 0.9 % IJ SOLN: 3.0000 mL | INTRAMUSCULAR | Status: DC | PRN

## 2011-12-15 MED ORDER — SODIUM CHLORIDE 0.9 % IV SOLN: 1.0000 mL/kg/h | INTRAVENOUS | Status: DC

## 2011-12-15 MED ORDER — ASPIRIN 81 MG PO CHEW
324.0000 mg | CHEWABLE_TABLET | ORAL | Status: AC
  Administered 2011-12-15: 324 mg via ORAL

## 2011-12-15 MED ORDER — CLOPIDOGREL BISULFATE 300 MG PO TABS
300.0000 mg | ORAL_TABLET | Freq: Once | ORAL | Status: AC
  Administered 2011-12-15: 300 mg via ORAL

## 2011-12-15 MED ORDER — ONDANSETRON HCL 4 MG/2ML IJ SOLN: 4.0000 mg | Freq: Four times a day (QID) | INTRAMUSCULAR | Status: DC | PRN

## 2011-12-15 MED ORDER — CLOPIDOGREL BISULFATE 75 MG PO TABS: 75.0000 mg | ORAL_TABLET | Freq: Every day | ORAL | Status: DC

## 2011-12-15 MED ORDER — ISOSORBIDE MONONITRATE ER 30 MG PO TB24: 30.0000 mg | ORAL_TABLET | Freq: Every day | ORAL | Status: DC

## 2011-12-15 MED ORDER — ACETAMINOPHEN 325 MG PO TABS: 650.0000 mg | ORAL_TABLET | ORAL | Status: DC | PRN

## 2011-12-15 MED ORDER — ASPIRIN EC 325 MG PO TBEC: 325.0000 mg | DELAYED_RELEASE_TABLET | Freq: Every day | ORAL | Status: DC

## 2011-12-15 MED ORDER — SODIUM CHLORIDE 0.9 % IV SOLN: INTRAVENOUS | Status: DC

## 2011-12-15 MED ORDER — SODIUM CHLORIDE 0.9 % IV SOLN: 250.0000 mL | INTRAVENOUS | Status: DC | PRN

## 2011-12-15 NOTE — H&P (View-Only) (Signed)
--------------------------------------------------------------------------------  Subjective:    CC:      1. CF/cath work up.      HPI:     General:             Jesus [name] is a 78 yo male followed by Dr Mayford Knife with a hx of CABG in 2005, mild aortic stenosis, hypertension and GERD. He was recently seen due to chest pain that had awakened him out of sleep with pain in his left axilla that was a burning pressure and had a funny taste in his mouth. He took an ASA and about 1 and 1/2 hours later it went away. This has occurred a total of 3 epsiodes, always around 1 am. Dr Mayford Knife proceeded with nuclear stress test with fixed inferior wall ischemia and mild perinfarct ischemia, EF normal. Plan to proceed with cardiac cath at this time. He had 1 reoccurance of above symptoms last friday night. He denies any SOB, palpitations, dizziness, nausea or diaphoresis.   .      ROS:      as noted in HPI, + occasional abdominal bloating and mild queeziness but not full nausea no black or bloody Bms, no fever, chills, congestion, no neurological changes. no allergy to IVP dye.     Medical History: Coronary artery disease status post coronary artery bypass grafting 11/05, mild aortic stenosis by 2-D echocardiogram in 2013, Hypertension, Dyslipidemia, moderate bilateral carotid artery stenosis followed at [facility].      Surgical History: Prostate Bx, Dr Annabell Howells .      Family History:  Father: deceased 1 yrs MI Mother: deceased 67 yrs Her pacemaker      Social History:      General: History of smoking  cigarettes: Former smoker.  no Smoking. Alcohol: yes, 3 ounces of scotch daily. no Caffeine. no Recreational drug use. no Diet. Exercise: yes. Occupation: unemployed, retired. Marital Status: single. Children: 2 children.      Medications: Aspirin 325 MG Tablet Chewable 1 tablet Once a day, Simvastatin 20 Milligram Miscellaneous Unspecified Take 1 tablet daily , Centrum Silver Tablet 1 tablet once a day,  Rapaflo 8 mg caplet one daily, Saw Palmetto Capsule as directed on otc packaging , Omeprazole 20 MG Capsule Delayed Release 1 capsule Once a day, Metanx 3-35-2 MG Tablet 1 tablet Twice a day, Lidoderm 5 % Patch 1 patch to intact skin remove after 12 hours as needed, Medication List reviewed and reconciled with the patient     Allergies: Cardura: lowered BP too low: Side Effects, Gabapentin: Weight gain: Side Effects.      Objective:    Vitals: Wt 232, Wt change -1.8 lb, Ht 70, BMI 33.28, Pulse sitting 68, BP sitting 124/66.     Examination:     Cardiology, General:         GENERAL APPEARANCE: pleasant, NAD.  HEENT: unremarkable.  CAROTID UPSTROKE: normal, no bruit.  JVD: flat.  HEART SOUNDS: regular, normal S1, S2, no S3 or S4.  MURMUR: absent.  LUNGS: no rales or wheezes.  ABDOMEN: soft, non tender, positive bowel sounds, no masses felt.  EXTREMITIES: no leg edema.  PERIPHERAL PULSES: 2 plus bilateral.            Assessment:    Assessment:  1. Chest pain - 786.50 (Primary)   2. Coronary atherosclerosis of native coronary artery - 414.01   3. Aortic valve disorders - 424.1   4. Hypertension, essential - 401.1   5. Abnormal nuclear stress test -  794.39     Plan:    1. Coronary atherosclerosis of native coronary artery  Continue Aspirin Tablet Chewable, 325 MG, 1 tablet, Orally, Once a day ;  Continue Simvastatin Miscellaneous Unspecified, 20 Milligram, Take 1 tablet daily .       2. Abnormal nuclear stress test        LAB: CBC with Diff     WBC 5.9 4.0-11.0 - K/ul         RBC 4.14 4.20-5.80 - M/uL L       HGB 13.4 13.0-17.0 - g/dL        HCT 11.9 14.7-82.9 - %        MCH 32.5 27.0-33.0 - pg        MPV 8.9 7.5-10.7 - fL         MCV 99.6 80.0-94.0 - fL H       MCHC 32.6 32.0-36.0 - g/dL        RDW 56.2 13.0-86.5 - %        NRBC# 0.00 -        PLT 209 150-400 - K/uL        NEUT % 64.5 43.3-71.9 - %        NRBC% 0.00 - %        LYMPH% 18.1 16.8-43.5 - %        MONO % 10.2  4.6-12.4 - %        EOS % 4.2 0.0-7.8 - %         BASO % 3.0 0.0-1.0 - % H       NEUT # 3.8 1.9-7.2 - K/uL        LYMPH# 1.10 1.10-2.70 - K/uL        MONO # 0.6 0.3-0.8 - K/uL        EOS # 0.2 0.0-0.6 - K/uL         BASO # 0.2 0.0-0.1 - K/uL H              FERGUSON,CYNTHIA A 12/09/2011 05:25:47 PM > ok for cath        LAB: Basic Metabolic      GLUCOSE 111 70-99 - mg/dL H       BUN 25 7-84 - mg/dL        CREATININE 6.96 0.60-1.30 - mg/dl         eGFR (NON-AFRICAN AMERICAN) 58 >60 - calc L       eGFR (AFRICAN AMERICAN) 70 >60 - calc        SODIUM 138 136-145 - mmol/L        POTASSIUM 5.4 3.5-5.5 - mmol/L        CHLORIDE 103 98-107 - mmol/L        C02 30 22-32 - mg/dL        ANION GAP 29.5 2.8-41.3 - mmol/L        CALCIUM 9.8 8.6-10.3 - mg/dL               FERGUSON,CYNTHIA A 12/09/2011 05:25:22 PM > ok for cath, he is not on any potassium        LAB: PT and PTT (244010)     aPTT 28 24-33 - SEC        INR 1.0 0.8-1.2 -        Prothrombin Time 10.9 9.1-12.0 - SEC               FERGUSON,CYNTHIA A 12/10/2011 08:25:48 AM > ok for  cath   Risks and benefits of cardiac catheterization have been reviewed including risk of stroke, heart attack, death, bleeding, renal impariment and arterial damage. There was ample oppurtuny to answer questions. Alternatives were discussed. Patient understands and wishes to proceed.          Immunizations:       Labs:      Procedure Codes: 16109 ECL CBC PLATELET DIFF, 80048 ECL BMP, 60454 BLOOD COLLECTION ROUTINE VENIPUNCTURE     Preventive:          cc to EPIC.     Follow Up: TT pending cath results (Reason: CAD, chest pain)        Provider: Michaell Cowing. Emelda Fear, NP  Patient: Jesus, [name]  DOB: [birth year]  Date: 12/09/2011

## 2011-12-15 NOTE — CV Procedure (Addendum)
PROCEDURE:  Left heart catheterization with selective coronary angiography, left ventriculogram.  INDICATIONS:    The risks, benefits, and details of the procedure were explained to the patient.  The patient verbalized understanding and wanted to proceed.  Informed written consent was obtained.  PROCEDURE TECHNIQUE:  After Xylocaine anesthesia a 47F sheath was placed in the right femoral artery with a single anterior needle wall stick.   Left coronary angiography was done using a Judkins L4 guide catheter.  Right coronary angiography was done using a Judkins R4 guide catheter.  Left ventriculography was done using a pigtail catheter.    CONTRAST:  Total of 125cc.  COMPLICATIONS:  None.    HEMODYNAMICS:  Aortic pressure was 136/55mmHg.  The AV was not able to be crossed using a wire.  ANGIOGRAPHIC DATA:   The left main coronary artery is patent with a 40% distal stenosis.  It bifurcates into an LAD and left circumflex arteries.  The left anterior descending artery has a proximal 80% stenosis at the takeoff of a diagonal.  The ongoing LAD is patent and then has evidence of competitive flow from a LIMA graft.  The left circumflex artery has an ostial 90% stenosis.  The ongoing left circ gives rise to an OM #1 which is occluded with evidence of left to left collaterals filling it.  The ongoing left circ gives rise to a second OM#2 which is moderate in size and widely patent.  This is a left dominant system with the PDA coming off of the distal left circumflex and is widely patient.  The right coronary artery is nondominant and gives rise to a large acute RV marginal branch and then terminates in a PL branch.  The SVG to the RCA is mildly atretic but patent without significant stenosis.    The SVG to the Diagonal is widely patent with evidence of retrograde flow into the proximal LAD.    The LIMA to the LAD could not be visualized but is open with evidence of competitive flow in the distal LAD  which is a small vessel.  LEFT VENTRICULOGRAM: The pigtal catheter could not cross the AV.  IMPRESSIONS: 1.  Severe 2 vessel ASCAD with high grade LAD stenosis with evidence of competitive flow from LIMA graft and distally patent but small vessel. 2.  Patent SVG to diagonal 3.  Mildly atretic but widely patent SVG to RCA 4.  High grade 90% ostial left circumflex left dominant with PDA coming off distal left circumflex.   RECOMMENDATION:   1.  D/C home once IVF and bedrest complete 2.  Films reviewed by Dr. Eldridge Dace for PCI of left circ.  Since patient has not had chest pain in a week per Dr. Eldridge Dace, will discharge home and plan outpt PCI on 7/24. 3.  ASA 4.  Start Plavix 75mg  daily 5.  Start Imdur 30mg  daily

## 2011-12-15 NOTE — OR Nursing (Signed)
Dr Turner at bedside to discuss results and treatment plan with pt and family 

## 2011-12-15 NOTE — OR Nursing (Signed)
Meal served 

## 2011-12-15 NOTE — Interval H&P Note (Signed)
History and Physical Interval Note:  12/15/2011 8:42 AM  Jesus [name]  has presented today for surgery, with the diagnosis of abnormal stress test, chest pain  The various methods of treatment have been discussed with the patient and family. After consideration of risks, benefits and other options for treatment, the patient has consented to  Procedure(s) (LRB): JV LEFT HEART CATHETERIZATION WITH CORONARY ANGIOGRAM (N/A) as a surgical intervention .  The patient's history has been reviewed, patient examined, no change in status, stable for surgery.  I have reviewed the patient's chart and labs.  Questions were answered to the patient's satisfaction.     TURNER,TRACI R

## 2011-12-15 NOTE — OR Nursing (Signed)
Tegaderm dressing applied, site level 0, bedrest begins at 1005 

## 2011-12-15 NOTE — OR Nursing (Signed)
Discharge instructions reviewed and signed, pt stated understanding, ambulated in hall without difficulty, site level 0, transported to son's car via wheelchair 

## 2011-12-16 ENCOUNTER — Encounter (HOSPITAL_COMMUNITY): Payer: Self-pay | Admitting: Pharmacy Technician

## 2011-12-18 ENCOUNTER — Encounter (HOSPITAL_COMMUNITY): Payer: Self-pay | Admitting: General Practice

## 2011-12-18 ENCOUNTER — Encounter (HOSPITAL_COMMUNITY)
Admission: RE | Disposition: A | Discharge status: Home / Self Care | Payer: Self-pay | Source: Ambulatory Visit | Attending: Interventional Cardiology

## 2011-12-18 ENCOUNTER — Ambulatory Visit (HOSPITAL_COMMUNITY)
Admission: RE | Admit: 2011-12-18 | Discharge: 2011-12-19 | Disposition: A | Discharge status: Home / Self Care | Payer: Medicare Other | Source: Ambulatory Visit | Attending: Interventional Cardiology | Admitting: Interventional Cardiology

## 2011-12-18 DIAGNOSIS — I209 Angina pectoris, unspecified: Secondary | ICD-10-CM | POA: Insufficient documentation

## 2011-12-18 DIAGNOSIS — I10 Essential (primary) hypertension: Secondary | ICD-10-CM | POA: Insufficient documentation

## 2011-12-18 DIAGNOSIS — I2510 Atherosclerotic heart disease of native coronary artery without angina pectoris: Secondary | ICD-10-CM | POA: Insufficient documentation

## 2011-12-18 DIAGNOSIS — I359 Nonrheumatic aortic valve disorder, unspecified: Secondary | ICD-10-CM | POA: Insufficient documentation

## 2011-12-18 DIAGNOSIS — R9439 Abnormal result of other cardiovascular function study: Secondary | ICD-10-CM | POA: Insufficient documentation

## 2011-12-18 DIAGNOSIS — K219 Gastro-esophageal reflux disease without esophagitis: Secondary | ICD-10-CM | POA: Insufficient documentation

## 2011-12-18 DIAGNOSIS — E785 Hyperlipidemia, unspecified: Secondary | ICD-10-CM | POA: Insufficient documentation

## 2011-12-18 HISTORY — DX: Unspecified osteoarthritis, unspecified site: M19.90

## 2011-12-18 HISTORY — DX: Acute myocardial infarction, unspecified: I21.9

## 2011-12-18 HISTORY — DX: Myoneural disorder, unspecified: G70.9

## 2011-12-18 HISTORY — DX: Cardiac murmur, unspecified: R01.1

## 2011-12-18 HISTORY — PX: CORONARY ANGIOPLASTY WITH STENT PLACEMENT: SHX49

## 2011-12-18 HISTORY — PX: PERCUTANEOUS CORONARY STENT INTERVENTION (PCI-S): SHX5485

## 2011-12-18 LAB — BASIC METABOLIC PANEL
CO2: 24 mEq/L (ref 19–32)
Calcium: 9.2 mg/dL (ref 8.4–10.5)
Chloride: 108 mEq/L (ref 96–112)
Creatinine, Ser: 1.2 mg/dL (ref 0.50–1.35)
GFR calc Af Amer: 64 mL/min — ABNORMAL LOW (ref >90)
Sodium: 141 mEq/L (ref 135–145)

## 2011-12-18 LAB — POCT ACTIVATED CLOTTING TIME: Activated Clotting Time: 404 seconds

## 2011-12-18 LAB — CBC
Platelets: 203 10*3/uL (ref 150–400)
RBC: 3.89 MIL/uL — ABNORMAL LOW (ref 4.22–5.81)
RDW: 13.5 % (ref 11.5–15.5)
WBC: 6.7 10*3/uL (ref 4.0–10.5)

## 2011-12-18 SURGERY — PERCUTANEOUS CORONARY STENT INTERVENTION (PCI-S)
Anesthesia: LOCAL

## 2011-12-18 MED ORDER — BIVALIRUDIN 250 MG IV SOLR
INTRAVENOUS | Status: AC
  Filled 2011-12-18: qty 250

## 2011-12-18 MED ORDER — SODIUM CHLORIDE 0.9 % IJ SOLN: 3.0000 mL | INTRAMUSCULAR | Status: DC | PRN

## 2011-12-18 MED ORDER — SODIUM CHLORIDE 0.9 % IV SOLN
INTRAVENOUS | Status: DC
  Administered 2011-12-18: 1000 mL via INTRAVENOUS

## 2011-12-18 MED ORDER — CLOPIDOGREL BISULFATE 75 MG PO TABS: 75.0000 mg | ORAL_TABLET | Freq: Every day | ORAL | Status: DC

## 2011-12-18 MED ORDER — L-METHYLFOLATE-B6-B12 3-35-2 MG PO TABS
1.0000 | ORAL_TABLET | Freq: Two times a day (BID) | ORAL | Status: DC
  Administered 2011-12-19: 1 via ORAL
  Filled 2011-12-18 (×3): qty 1

## 2011-12-18 MED ORDER — SIMVASTATIN 20 MG PO TABS
20.0000 mg | ORAL_TABLET | Freq: Every evening | ORAL | Status: DC
  Administered 2011-12-18: 18:00:00 20 mg via ORAL
  Filled 2011-12-18 (×2): qty 1

## 2011-12-18 MED ORDER — VERAPAMIL HCL 2.5 MG/ML IV SOLN
INTRAVENOUS | Status: AC
  Filled 2011-12-18: qty 4

## 2011-12-18 MED ORDER — ASPIRIN 81 MG PO CHEW
324.0000 mg | CHEWABLE_TABLET | ORAL | Status: AC
  Administered 2011-12-18: 324 mg via ORAL

## 2011-12-18 MED ORDER — ISOSORBIDE MONONITRATE ER 30 MG PO TB24
30.0000 mg | ORAL_TABLET | Freq: Every day | ORAL | Status: DC
  Administered 2011-12-18 – 2011-12-19 (×2): 30 mg via ORAL
  Filled 2011-12-18 (×2): qty 1

## 2011-12-18 MED ORDER — ACETAMINOPHEN 325 MG PO TABS: 650.0000 mg | ORAL_TABLET | ORAL | Status: DC | PRN

## 2011-12-18 MED ORDER — MORPHINE SULFATE 2 MG/ML IJ SOLN
1.0000 mg | INTRAMUSCULAR | Status: DC | PRN
  Administered 2011-12-18: 16:00:00 1 mg via INTRAVENOUS
  Filled 2011-12-18: qty 1

## 2011-12-18 MED ORDER — SODIUM CHLORIDE 0.9 % IV SOLN: 1.0000 mL/kg/h | INTRAVENOUS | Status: AC

## 2011-12-18 MED ORDER — ASPIRIN EC 325 MG PO TBEC
325.0000 mg | DELAYED_RELEASE_TABLET | Freq: Every day | ORAL | Status: DC
  Administered 2011-12-19: 10:00:00 325 mg via ORAL
  Filled 2011-12-18 (×2): qty 1

## 2011-12-18 MED ORDER — DIAZEPAM 5 MG PO TABS
5.0000 mg | ORAL_TABLET | ORAL | Status: AC
  Administered 2011-12-18: 5 mg via ORAL

## 2011-12-18 MED ORDER — ONDANSETRON HCL 4 MG/2ML IJ SOLN
4.0000 mg | Freq: Four times a day (QID) | INTRAMUSCULAR | Status: DC | PRN
  Administered 2011-12-18: 16:00:00 4 mg via INTRAVENOUS
  Filled 2011-12-18: qty 2

## 2011-12-18 MED ORDER — ASPIRIN 81 MG PO CHEW
CHEWABLE_TABLET | ORAL | Status: AC
  Administered 2011-12-18: 324 mg via ORAL
  Filled 2011-12-18: qty 4

## 2011-12-18 MED ORDER — HYDRALAZINE HCL 20 MG/ML IJ SOLN
10.0000 mg | INTRAMUSCULAR | Status: DC | PRN
  Filled 2011-12-18: qty 1

## 2011-12-18 MED ORDER — TAMSULOSIN HCL 0.4 MG PO CAPS
0.4000 mg | ORAL_CAPSULE | Freq: Every day | ORAL | Status: DC
  Administered 2011-12-18: 18:00:00 0.4 mg via ORAL
  Filled 2011-12-18 (×2): qty 1

## 2011-12-18 MED ORDER — MIDAZOLAM HCL 2 MG/2ML IJ SOLN
INTRAMUSCULAR | Status: AC
  Filled 2011-12-18: qty 2

## 2011-12-18 MED ORDER — LIDOCAINE 5 % EX PTCH
1.0000 | MEDICATED_PATCH | Freq: Every day | CUTANEOUS | Status: DC | PRN
  Filled 2011-12-18: qty 1

## 2011-12-18 MED ORDER — HEPARIN (PORCINE) IN NACL 2-0.9 UNIT/ML-% IJ SOLN
INTRAMUSCULAR | Status: AC
  Filled 2011-12-18: qty 2000

## 2011-12-18 MED ORDER — DIAZEPAM 5 MG PO TABS
ORAL_TABLET | ORAL | Status: AC
  Administered 2011-12-18: 5 mg via ORAL
  Filled 2011-12-18: qty 1

## 2011-12-18 MED ORDER — HEPARIN (PORCINE) IN NACL 2-0.9 UNIT/ML-% IJ SOLN
INTRAMUSCULAR | Status: AC
  Filled 2011-12-18: qty 1000

## 2011-12-18 MED ORDER — SODIUM CHLORIDE 0.9 % IV SOLN
1.7500 mg/kg/h | INTRAVENOUS | Status: AC
  Administered 2011-12-18: 1.75 mg/kg/h via INTRAVENOUS
  Filled 2011-12-18: qty 250

## 2011-12-18 MED ORDER — FENTANYL CITRATE 0.05 MG/ML IJ SOLN
INTRAMUSCULAR | Status: AC
  Filled 2011-12-18: qty 2

## 2011-12-18 MED ORDER — LIDOCAINE HCL (PF) 1 % IJ SOLN
INTRAMUSCULAR | Status: AC
  Filled 2011-12-18: qty 30

## 2011-12-18 MED ORDER — NITROGLYCERIN 0.2 MG/ML ON CALL CATH LAB
INTRAVENOUS | Status: AC
  Filled 2011-12-18: qty 1

## 2011-12-18 MED ORDER — SODIUM CHLORIDE 0.9 % IV SOLN: 250.0000 mL | INTRAVENOUS | Status: DC | PRN

## 2011-12-18 MED ORDER — SODIUM CHLORIDE 0.9 % IJ SOLN: 3.0000 mL | Freq: Two times a day (BID) | INTRAMUSCULAR | Status: DC

## 2011-12-18 MED ORDER — ASPIRIN 325 MG PO TABS: 325.0000 mg | ORAL_TABLET | Freq: Every day | ORAL | Status: DC

## 2011-12-18 NOTE — CV Procedure (Signed)
PROCEDURE:  PCI of the proximal left circumflex.  IVUS of the circumflex.  Rotational atherectomy of the left circumflex.  INDICATIONS:  Angina; abnormal stress test.  The risks, benefits, and details of the procedure were explained to the patient.  The patient verbalized understanding and wanted to proceed.  Informed written consent was obtained.  PROCEDURE TECHNIQUE:  After Xylocaine anesthesia a 20F sheath was placed in the right femoral artery with a single anterior needle wall stick.   A 6Fr sheath was placed in the right femoral vein.  Left coronary angiography was done using a CLS 3.5 guide catheter.  A transvenous pacemaker was placed through the venous sheath and set to her rate of 50 beats per minute with an output of 5 milliamps.   CONTRAST:  Total of 125 cc.  COMPLICATIONS:  None.    HEMODYNAMICS:  Aortic pressure was 127/64   ANGIOGRAPHIC DATA:    The left circumflex artery is a large dominant vessel.  There is a proximal 90%, heavily calcified focal stenosis.  Calcium does extend into the ostial circumflex and distal left main.  PCI NARRATIVE: A 7 Fr CLS 3.5 guiding catheter was used to engage the left main.  A pro-water wire was placed across the stenosis.  An attempt was made to advance an IVUS catheter past the stenosis.  Due to the severe stenosis, the IVUS catheter would not pass.  Rotational atherectomy was performed with a 1.5 burr.  3 passes were made.  There was angiographic improvement.  There is still residual calcium.  A 2.0 burr was then advanced but would not fit through the guide catheter.  After the procedure, we observed that the guide catheter tip was somewhat flattened.  Instead, intravascular ultrasound was performed.  This revealed a 180 ARC of calcium at the most focal area of the stenosis.  The remainder of the lumen was quite healthy and without significant plaque.  After the intravascular ultrasound, a 2.75 x 10 cutting balloon was inflated to 10 atmospheres  twice.  A 3.5 x 12 Promus drug-eluting stent was then deployed.  A 3.75 x 8 McIntosh Quantum apex balloon was inflated up to 20 atmospheres.  There is no residual stenosis.  There was residual calcium in the distal left main which was not covered by stent.  Several doses of intracoronary nitroglycerin administered to relieve vasospasm.  The patient tolerated the procedure well.  IMPRESSIONS:  1. Successful rotational atherectomy, Cutting Balloon angioplasty and stent placement in the proximal circumflex with a 3.5 x 12 Promus drug-eluting stent. 2.   Intravascular ultrasound of the proximal to mid circumflex and distal left main. 3.   Temporary pacemaker placement from the right femoral vein due to use of rotational atherectomy.  The pacemaker did fire during the first Rotablator run.  RECOMMENDATION:  Continue aspirin and Plavix for at least a year.  Continue aggressive secondary prevention.  The patient will be watched overnight and hopefully discharged tomorrow if there are no complications.

## 2011-12-18 NOTE — H&P (Signed)
  Date of Initial H&P: 12/10/11  History reviewed, patient examined, no change in status, stable for surgery.

## 2011-12-19 LAB — BASIC METABOLIC PANEL
CO2: 27 mEq/L (ref 19–32)
Chloride: 108 mEq/L (ref 96–112)
Glucose, Bld: 98 mg/dL (ref 70–99)
Sodium: 142 mEq/L (ref 135–145)

## 2011-12-19 LAB — CBC
Hemoglobin: 11.5 g/dL — ABNORMAL LOW (ref 13.0–17.0)
MCH: 32.9 pg (ref 26.0–34.0)
MCV: 98.9 fL (ref 78.0–100.0)
Platelets: 189 10*3/uL (ref 150–400)
RBC: 3.5 MIL/uL — ABNORMAL LOW (ref 4.22–5.81)
WBC: 6.9 10*3/uL (ref 4.0–10.5)

## 2011-12-19 MED ORDER — PANTOPRAZOLE SODIUM 40 MG PO TBEC: 40.0000 mg | DELAYED_RELEASE_TABLET | Freq: Every day | ORAL | Status: DC

## 2011-12-19 MED ORDER — PANTOPRAZOLE SODIUM 40 MG PO TBEC
40.0000 mg | DELAYED_RELEASE_TABLET | Freq: Every day | ORAL | Status: DC
  Administered 2011-12-19: 10:00:00 40 mg via ORAL
  Filled 2011-12-19: qty 1

## 2011-12-19 MED ORDER — OXYCODONE HCL 5 MG PO TABS: 5.0000 mg | ORAL_TABLET | Freq: Four times a day (QID) | ORAL | Status: AC | PRN

## 2011-12-19 MED ORDER — ZOLPIDEM TARTRATE 5 MG PO TABS
5.0000 mg | ORAL_TABLET | Freq: Every evening | ORAL | Status: DC | PRN
  Administered 2011-12-19: 5 mg via ORAL
  Filled 2011-12-19: qty 1

## 2011-12-19 MED ORDER — CLOPIDOGREL BISULFATE 75 MG PO TABS
300.0000 mg | ORAL_TABLET | Freq: Once | ORAL | Status: AC
  Administered 2011-12-19: 300 mg via ORAL
  Filled 2011-12-19: qty 4

## 2011-12-19 MED ORDER — OXYCODONE HCL 5 MG PO TABS
5.0000 mg | ORAL_TABLET | Freq: Four times a day (QID) | ORAL | Status: DC | PRN
  Administered 2011-12-19: 5 mg via ORAL
  Filled 2011-12-19: qty 1

## 2011-12-19 MED FILL — Dextrose Inj 5%: INTRAVENOUS | Qty: 50 | Status: AC

## 2011-12-19 NOTE — Progress Notes (Signed)
CARDIAC REHAB PHASE I   PRE:  Rate/Rhythm: 74SR  BP:  Supine: 115/62  Sitting:   Standing:    SaO2:   MODE:  Ambulation: 500 ft   POST:  Rate/Rhythem: 98SR  BP:  Supine:   Sitting: 128/42  Standing:    SaO2:  0853-0945 Pt walked 500 ft on RA with steady gait. States he is dizzy whenever he is up. Never falls. Denied chest pain. Education completed. Pt stated not interested in watching diet closely. Does not use salt shaker. States at his age he feels that he should be able to eat what he likes. Not interested in Phase 2 program. Walking instructions given. States will ex on his own. Tolerated walk well. To sitting on side of bed after walk.  Duanne Limerick

## 2011-12-19 NOTE — Discharge Summary (Signed)
Patient ID: Jesus [name] MRN: [id number] DOB/AGE: [birth year] 78 y.o.  Admit date: 12/18/2011 Discharge date: 12/19/2011  Primary Discharge Diagnosis  CAD Secondary Discharge Diagnosis GERD, neuropathy  Significant Diagnostic Studies: angiography: Drug eluting stent to the proximal circumflex.  Consults: None  Hospital Course: 78 y/o with CAD who has had abnromal stress test in the setting of chest pain.  He had a complex rotational atherectomy of the proximal circumflex with DES placement.  He tolerated the procedure well.  He had some right groin bruising.  He also had problems with his neuropathy which was relieved by oxycodone, and he requested a prescription for this.  He walked with cardiac rehab on the day after the procedure.  No chest pain overnight.    Prescription for oxycodone was given due to the likelihood of groin pain and his request to use it for neuropathy.  He was instructed to f/u with Dr. Tenny Craw to manage the neuropathy.  PPI was changed due to initiation of Plavix.   Discharge Exam: Blood pressure 93/49, pulse 70, temperature 98.1 F (36.7 C), temperature source Oral, resp. rate 19, height 5\' 9"  (1.753 m), weight 103 kg (227 lb 1.2 oz), SpO2 97.00%.  [place]/AT RRR S1S2 CTA bilaterally Right groin bruised and tender No edema  Labs:   Lab Results  Component Value Date   WBC 6.9 12/19/2011   HGB 11.5* 12/19/2011   HCT 34.6* 12/19/2011   MCV 98.9 12/19/2011   PLT 189 12/19/2011    Lab 12/19/11 0535  NA 142  K 4.7  CL 108  CO2 27  BUN 17  CREATININE 1.18  CALCIUM 8.6  PROT --  BILITOT --  ALKPHOS --  ALT --  AST --  GLUCOSE 98   No results found for this basename: CKTOTAL, CKMB, CKMBINDEX, TROPONINI    No results found for this basename: CHOL   No results found for this basename: HDL   No results found for this basename: LDLCALC   No results found for this basename: TRIG   No results found for this basename: CHOLHDL   No results found for this  basename: LDLDIRECT       EKG:NSR, no ST segment changes  FOLLOW UP PLANS AND APPOINTMENTS  Medication List  As of 12/19/2011  8:57 AM   STOP taking these medications         FOLTANX 3-35-2 MG Tabs      omeprazole 20 MG capsule         TAKE these medications         aspirin 325 MG tablet   Take 325 mg by mouth daily. On hold until after surgery      CENTRUM SILVER PO   Take 1 tablet by mouth daily.      clopidogrel 75 MG tablet   Commonly known as: PLAVIX   Take 1 tablet (75 mg total) by mouth daily.      isosorbide mononitrate 30 MG 24 hr tablet   Commonly known as: IMDUR   Take 30 mg by mouth daily.      l-methylfolate-B6-B12 3-35-2 MG Tabs   Commonly known as: METANX   Take 1 tablet by mouth 2 (two) times daily.      lidocaine 5 %   Commonly known as: LIDODERM   Place 1 patch onto the skin daily as needed. Remove & Discard patch within 12 hours or as directed by MD: used on left foot for pain      oxyCODONE  5 MG immediate release tablet   Commonly known as: Oxy IR/ROXICODONE   Take 1 tablet (5 mg total) by mouth every 6 (six) hours as needed for pain.      pantoprazole 40 MG tablet   Commonly known as: PROTONIX   Take 1 tablet (40 mg total) by mouth daily at 6 (six) AM.      silodosin 8 MG Caps capsule   Commonly known as: RAPAFLO   Take 8 mg by mouth daily with breakfast.      simvastatin 20 MG tablet   Commonly known as: ZOCOR   Take 20 mg by mouth every evening.           Follow-up Information    Follow up with Quintella Reichert, MD. Call in 2 weeks.   Contact information:   [address] [place] [address] [phone]          BRING ALL MEDICATIONS WITH YOU TO FOLLOW UP APPOINTMENTS  Time spent with patient to include physician time:25 minutes Signed: Demetrius Mahler S. 12/19/2011, 8:57 AM

## 2012-10-10 ENCOUNTER — Emergency Department (HOSPITAL_BASED_OUTPATIENT_CLINIC_OR_DEPARTMENT_OTHER): Payer: Medicare Other

## 2012-10-10 ENCOUNTER — Emergency Department (HOSPITAL_BASED_OUTPATIENT_CLINIC_OR_DEPARTMENT_OTHER)
Admission: EM | Admit: 2012-10-10 | Discharge: 2012-10-10 | Disposition: A | Discharge status: Home / Self Care | Payer: Medicare Other | Attending: Emergency Medicine | Admitting: Emergency Medicine

## 2012-10-10 ENCOUNTER — Encounter (HOSPITAL_BASED_OUTPATIENT_CLINIC_OR_DEPARTMENT_OTHER): Payer: Self-pay | Admitting: Emergency Medicine

## 2012-10-10 DIAGNOSIS — S32409A Unspecified fracture of unspecified acetabulum, initial encounter for closed fracture: Secondary | ICD-10-CM | POA: Insufficient documentation

## 2012-10-10 DIAGNOSIS — I252 Old myocardial infarction: Secondary | ICD-10-CM | POA: Insufficient documentation

## 2012-10-10 DIAGNOSIS — N189 Chronic kidney disease, unspecified: Secondary | ICD-10-CM | POA: Insufficient documentation

## 2012-10-10 DIAGNOSIS — S32402A Unspecified fracture of left acetabulum, initial encounter for closed fracture: Secondary | ICD-10-CM

## 2012-10-10 DIAGNOSIS — Y92009 Unspecified place in unspecified non-institutional (private) residence as the place of occurrence of the external cause: Secondary | ICD-10-CM | POA: Insufficient documentation

## 2012-10-10 DIAGNOSIS — Z951 Presence of aortocoronary bypass graft: Secondary | ICD-10-CM | POA: Insufficient documentation

## 2012-10-10 DIAGNOSIS — Z7982 Long term (current) use of aspirin: Secondary | ICD-10-CM | POA: Insufficient documentation

## 2012-10-10 DIAGNOSIS — I2510 Atherosclerotic heart disease of native coronary artery without angina pectoris: Secondary | ICD-10-CM | POA: Insufficient documentation

## 2012-10-10 DIAGNOSIS — Z87891 Personal history of nicotine dependence: Secondary | ICD-10-CM | POA: Insufficient documentation

## 2012-10-10 DIAGNOSIS — R011 Cardiac murmur, unspecified: Secondary | ICD-10-CM | POA: Insufficient documentation

## 2012-10-10 DIAGNOSIS — Z8739 Personal history of other diseases of the musculoskeletal system and connective tissue: Secondary | ICD-10-CM | POA: Insufficient documentation

## 2012-10-10 DIAGNOSIS — Z8669 Personal history of other diseases of the nervous system and sense organs: Secondary | ICD-10-CM | POA: Insufficient documentation

## 2012-10-10 DIAGNOSIS — Z79899 Other long term (current) drug therapy: Secondary | ICD-10-CM | POA: Insufficient documentation

## 2012-10-10 DIAGNOSIS — E785 Hyperlipidemia, unspecified: Secondary | ICD-10-CM | POA: Insufficient documentation

## 2012-10-10 DIAGNOSIS — I129 Hypertensive chronic kidney disease with stage 1 through stage 4 chronic kidney disease, or unspecified chronic kidney disease: Secondary | ICD-10-CM | POA: Insufficient documentation

## 2012-10-10 DIAGNOSIS — Z7902 Long term (current) use of antithrombotics/antiplatelets: Secondary | ICD-10-CM | POA: Insufficient documentation

## 2012-10-10 DIAGNOSIS — Y9389 Activity, other specified: Secondary | ICD-10-CM | POA: Insufficient documentation

## 2012-10-10 DIAGNOSIS — Z8679 Personal history of other diseases of the circulatory system: Secondary | ICD-10-CM | POA: Insufficient documentation

## 2012-10-10 DIAGNOSIS — X500XXA Overexertion from strenuous movement or load, initial encounter: Secondary | ICD-10-CM | POA: Insufficient documentation

## 2012-10-10 MED ORDER — HYDROCODONE-ACETAMINOPHEN 5-325 MG PO TABS: 2.0000 | ORAL_TABLET | ORAL | Status: DC | PRN

## 2012-10-10 MED ORDER — HYDROMORPHONE HCL PF 1 MG/ML IJ SOLN
1.0000 mg | Freq: Once | INTRAMUSCULAR | Status: AC
  Administered 2012-10-10: 1 mg via INTRAMUSCULAR
  Filled 2012-10-10: qty 1

## 2012-10-10 MED ORDER — ONDANSETRON 8 MG PO TBDP
8.0000 mg | ORAL_TABLET | Freq: Once | ORAL | Status: AC
  Administered 2012-10-10: 8 mg via ORAL
  Filled 2012-10-10: qty 1

## 2012-10-10 NOTE — ED Notes (Signed)
Pt stepped into a hole in yard last weekend and felt a pain in his left hip that has since gotten progressively worse.  Pt ROM limited by pain in left hip.  Pain radiates down left leg.

## 2012-10-10 NOTE — ED Provider Notes (Signed)
History     CSN: [id number]  Arrival date & time 10/10/12  1016   First MD Initiated Contact with Patient 10/10/12 1026      Chief Complaint  Patient presents with  . Hip Pain    (Consider location/radiation/quality/duration/timing/severity/associated sxs/prior treatment) HPI Comments: Patient comes to the ER for evaluation of pain in the left hip area. Patient reports that he stepped in a hole while doing yard work one week ago and started having pain in the left buttock cheek and hip area. Over the past week, pain has progressively worsened. Patient now having difficulty standing because of the pain, states that the pain radiates down the left leg, causing the leg to give out on him. He is having to use a cane to help him walk. He saw his doctor earlier in the week and was examined, told to give it a week to see if it got better. Patient reports it is now hurting more. No numbness, tingling in the lower extremities. No change in bladder function.  Patient is a 79 y.o. male presenting with hip pain.  Hip Pain    Past Medical History  Diagnosis Date  . Coronary artery disease     CABG 2005  . Aortic stenosis, mild   . Hypertension   . Dyslipidemia   . Carotid artery stenosis   . Myocardial infarction   . Heart murmur   . Chronic kidney disease     nocturia  . Arthritis   . Neuromuscular disorder     neuropathy    Past Surgical History  Procedure Laterality Date  . Coronary artery bypass graft    . Prostate biopsy    . Angiogram  12/18/2011  . Artherectomy    . Cardiac catheterization    . Cholecystectomy  1972  . Abdominal exploration surgery    . Cataracts      No family history on file.  History  Substance Use Topics  . Smoking status: Former Smoker    Quit date: 06/20/1979  . Smokeless tobacco: Never Used  . Alcohol Use: 0.6 oz/week    1 Shots of liquor per week     Comment: daily      Review of Systems  Genitourinary: Negative.   Musculoskeletal:  Positive for back pain and arthralgias.    Allergies  Cardura and Gabapentin  Home Medications   Current Outpatient Rx  Name  Route  Sig  Dispense  Refill  . aspirin 325 MG tablet   Oral   Take 325 mg by mouth daily. On hold until after surgery         . clopidogrel (PLAVIX) 75 MG tablet   Oral   Take 1 tablet (75 mg total) by mouth daily.   30 tablet   11   . isosorbide mononitrate (IMDUR) 30 MG 24 hr tablet   Oral   Take 30 mg by mouth daily.         Marland Kitchen l-methylfolate-B6-B12 (METANX) 3-35-2 MG TABS   Oral   Take 1 tablet by mouth 2 (two) times daily.         Marland Kitchen lidocaine (LIDODERM) 5 %   Transdermal   Place 1 patch onto the skin daily as needed. Remove & Discard patch within 12 hours or as directed by MD: used on left foot for pain         . Multiple Vitamins-Minerals (CENTRUM SILVER PO)   Oral   Take 1 tablet by mouth daily.         Marland Kitchen  pantoprazole (PROTONIX) 40 MG tablet   Oral   Take 1 tablet (40 mg total) by mouth daily at 6 (six) AM.   30 tablet   11   . silodosin (RAPAFLO) 8 MG CAPS capsule   Oral   Take 8 mg by mouth daily with breakfast.         . simvastatin (ZOCOR) 20 MG tablet   Oral   Take 20 mg by mouth every evening.           There were no vitals taken for this visit.  Physical Exam  Constitutional: He appears well-developed.  Eyes: Pupils are equal, round, and reactive to light.  Cardiovascular:  Pulses:      Dorsalis pedis pulses are 1+ on the right side, and 1+ on the left side.  Musculoskeletal:       Back:  Neurological: He has normal strength. No cranial nerve deficit or sensory deficit. He exhibits normal muscle tone.  Reflex Scores:      Patellar reflexes are 1+ on the right side and 1+ on the left side. Skin: Skin is warm, dry and intact.    ED Course  Procedures (including critical care time)  Labs Reviewed - No data to display Dg Lumbar Spine Complete  10/10/2012   *RADIOLOGY REPORT*  Clinical Data: Left  hip pain after stepped into a hole last weekend.  LUMBAR SPINE - COMPLETE 4+ VIEW  Comparison: CT abdomen pelvis 04/19/2012  Findings: Lumbar spine vertebral bodies are normal in height and alignment.  Disc space narrowing is seen at all levels, but most prominent at L1-2.  Anterior osteophyte formation is seen at all levels.  Advanced facet joint degenerative changes are seen throughout the lumbar spine.  No acute fracture or pars defect.  Extensive atherosclerotic calcification of the abdominal aorta and proximal iliac vasculature.  No evidence of aneurysm.  IMPRESSION:  1.  Multilevel degenerative disc disease and facet joint degenerative change. 2.  No acute bony abnormality of the lumbar spine. 3.  Extensive aortoiliac atherosclerotic calcification.   Original Report Authenticated By: Britta Mccreedy, M.D.   Dg Hip Complete Left  10/10/2012   *RADIOLOGY REPORT*  Clinical Data: Left hip pain after stepping in July old.  LEFT HIP - COMPLETE 2+ VIEW  Comparison: Lumbar spine radiographs 10/10/2012 and CT abdomen pelvis  04/19/2012  Findings: Bowel gas projects over the pelvis and sacrum.  The pelvic ring appears intact.  No evidence of fracture or diastasis. The left hip is located.  No acute fracture is identified. No significant degenerative changes of the hips for patient age. Atherosclerotic vascular calcifications are noted in the femoral arteries bilaterally.  Visualized bowel gas pattern is nonobstructive.  IMPRESSION: No acute bony abnormality.   Original Report Authenticated By: Britta Mccreedy, M.D.   Ct Hip Left Wo Contrast  10/10/2012   *RADIOLOGY REPORT*  Clinical Data: Left hip pain with decreased range of motion since stepping in a hole last week.  CT OF THE LEFT HIP WITHOUT CONTRAST  Technique:  Multidetector CT imaging was performed according to the standard protocol. Multiplanar CT image reconstructions were also generated.  Comparison: Radiographs dated 10/11/2012  Findings: There is suggestion  of a hairline fracture through the anterior-superior aspect of the acetabulum with no displacement. This is a coronal fracture.  It is best seen on the sagittal reconstructions.  I cannot see any disruption of the medial or lateral cortex.  There is a small left hip effusion.  The femoral head  is normal.  Adjacent soft tissues are normal.  IMPRESSION: Hairline nondisplaced fracture of the anterior-superior aspect of the left acetabulum.   Original Report Authenticated By: Francene Boyers, M.D.     Diagnosis: Left acetabular fracture    MDM  Patient presents to the ER with complaints of pain in the left SI joint area of his back as well as lateral hip. Pain radiates into the leg. He did step in a hole when the pain began. It has progressively worsened. X-ray of the lumbar spine did not show any acute abnormality. X-ray of the hip was negative as well. Because of the patient's increasing pain, CAT scan was performed and does show evidence of a hairline fracture of the acetabulum.  Case discussed with Dr. August Saucer. He'll see the patient in his clinic tomorrow. Patient will be given a walker to be nonweightbearing and analgesia with hydrocodone.        Gilda Crease, MD 10/10/12 1322

## 2012-11-12 ENCOUNTER — Encounter: Payer: Self-pay | Admitting: Podiatry

## 2012-11-12 ENCOUNTER — Ambulatory Visit (INDEPENDENT_AMBULATORY_CARE_PROVIDER_SITE_OTHER): Payer: Medicare Other | Admitting: Podiatry

## 2012-11-12 VITALS — BP 138/76 | HR 64

## 2012-11-12 DIAGNOSIS — L03032 Cellulitis of left toe: Secondary | ICD-10-CM

## 2012-11-12 DIAGNOSIS — M79675 Pain in left toe(s): Secondary | ICD-10-CM | POA: Insufficient documentation

## 2012-11-12 DIAGNOSIS — L600 Ingrowing nail: Secondary | ICD-10-CM

## 2012-11-12 DIAGNOSIS — L03039 Cellulitis of unspecified toe: Secondary | ICD-10-CM

## 2012-11-12 DIAGNOSIS — M79609 Pain in unspecified limb: Secondary | ICD-10-CM

## 2012-11-12 HISTORY — DX: Cellulitis of left toe: L03.032

## 2012-11-12 NOTE — Progress Notes (Signed)
Subjective: Painful ingrown nail left great toe lateral border. Unable to clip nail because he has broken pelvic bone.  Has Neuropathy at anterior leg to dorsum of foot x 15 years. Ingrown nail with inflamed toe lateral border left hallux.  Using Lidoderm for Neurpathic pain.  Objective: Ingrown nail left hallux lateral border with inflammation. Pedal pulses palpable. Severe Hallux valgus with bunion left. Right foot has previous bunion surgery, and has maintained correction.   Assessment: Symptomatic ingrown nail lateral border left hallux. Onychomycosis x 10. Idiopathic Neuropathy lower limb.  Plan: Removed offending border and all other nails debrided. Return in 3 months or as needed.

## 2013-02-11 ENCOUNTER — Ambulatory Visit (INDEPENDENT_AMBULATORY_CARE_PROVIDER_SITE_OTHER): Payer: Medicare Other | Admitting: Podiatry

## 2013-02-11 ENCOUNTER — Encounter: Payer: Self-pay | Admitting: Podiatry

## 2013-02-11 VITALS — BP 123/78 | HR 66 | Ht 70.0 in | Wt 237.0 lb

## 2013-02-11 DIAGNOSIS — L600 Ingrowing nail: Secondary | ICD-10-CM

## 2013-02-11 DIAGNOSIS — L03032 Cellulitis of left toe: Secondary | ICD-10-CM

## 2013-02-11 DIAGNOSIS — B351 Tinea unguium: Secondary | ICD-10-CM

## 2013-02-11 DIAGNOSIS — M79609 Pain in unspecified limb: Secondary | ICD-10-CM

## 2013-02-11 DIAGNOSIS — M79675 Pain in left toe(s): Secondary | ICD-10-CM

## 2013-02-11 NOTE — Patient Instructions (Addendum)
Seen for ingrown and hypertrophic nails. All nails debrided. No new problems or changes noted. May benefit from ingrown nail surgery on left great toe. Return in 3 months or as needed.

## 2013-02-11 NOTE — Progress Notes (Signed)
Subjective: 79 year old male patient presents complaining of painful ingrown nail left great toe lateral border.  Unable to clip nail because he has broken pelvic bone.  Has Neuropathy at anterior leg to dorsum of foot x 15 years. Using Lidoderm patch, which is helping. Mildly ingrown nail lateral border left hallux.   Objective:  Ingrown nail left hallux lateral border. Pedal pulses palpable.  Severe Hallux valgus with bunion left.  Right foot has previous bunion surgery, and has maintained correction.   Assessment: Ingrown nail lateral border left hallux.  Onychomycosis x 10.  Idiopathic Neuropathy lower limb.   Plan: All nails debrided.  Return in 3 months or as needed.

## 2013-05-13 ENCOUNTER — Ambulatory Visit: Payer: Medicare Other | Admitting: Podiatry

## 2013-07-05 ENCOUNTER — Encounter: Payer: Self-pay | Admitting: General Surgery

## 2013-07-05 DIAGNOSIS — E785 Hyperlipidemia, unspecified: Secondary | ICD-10-CM | POA: Insufficient documentation

## 2013-07-05 DIAGNOSIS — I10 Essential (primary) hypertension: Secondary | ICD-10-CM

## 2013-07-05 DIAGNOSIS — I2510 Atherosclerotic heart disease of native coronary artery without angina pectoris: Secondary | ICD-10-CM

## 2013-07-13 ENCOUNTER — Ambulatory Visit (INDEPENDENT_AMBULATORY_CARE_PROVIDER_SITE_OTHER): Payer: Medicare Other | Admitting: Cardiology

## 2013-07-13 ENCOUNTER — Encounter: Payer: Self-pay | Admitting: Cardiology

## 2013-07-13 VITALS — BP 112/68 | HR 74 | Ht 70.0 in | Wt 229.0 lb

## 2013-07-13 DIAGNOSIS — I2510 Atherosclerotic heart disease of native coronary artery without angina pectoris: Secondary | ICD-10-CM

## 2013-07-13 DIAGNOSIS — I350 Nonrheumatic aortic (valve) stenosis: Secondary | ICD-10-CM

## 2013-07-13 DIAGNOSIS — I10 Essential (primary) hypertension: Secondary | ICD-10-CM

## 2013-07-13 DIAGNOSIS — E785 Hyperlipidemia, unspecified: Secondary | ICD-10-CM

## 2013-07-13 DIAGNOSIS — I359 Nonrheumatic aortic valve disorder, unspecified: Secondary | ICD-10-CM

## 2013-07-13 MED ORDER — ASPIRIN EC 81 MG PO TBEC: 81.0000 mg | DELAYED_RELEASE_TABLET | Freq: Every day | ORAL | Status: DC

## 2013-07-13 NOTE — Patient Instructions (Signed)
Your physician has recommended you make the following change in your medication: 1. Decrease Aspirin to 81 MG daily  Your physician recommends that you return for lab work for NMR and ALT the day you have your Stress test   Your physician has requested that you have an echocardiogram. Echocardiography is a painless test that uses sound waves to create images of your heart. It provides your doctor with information about the size and shape of your heart and how well your heart's chambers and valves are working. This procedure takes approximately one hour. There are no restrictions for this procedure.  Your physician has requested that you have an exercise stress myoview. For further information please visit [email or web address]. Please follow instruction sheet, as given. ( Please schedule Fasting NMR and ALT same day as this test)  Your physician wants you to follow-up in: 1 Year You will receive a reminder letter in the mail two months in advance. If you don't receive a letter, please call our office to schedule the follow-up appointment.

## 2013-07-13 NOTE — Progress Notes (Signed)
[address] [place], [place]  [address] Phone: [phone] Fax:  [phone]  Date:  07/13/2013   ID:  Jesus [name], DOB 1929-05-20, MRN [id number]  PCP:  Miguel Aschoff, MD  Cardiologist:  Armanda Magic, MD     History of Present Illness: Jesus [name] is a 80 y.o. male with a history of ASCAD, HTN and dyslipidemia presents today for followup.  He is doing well.  He denies any chest pain, SOB, DOE, dizziness, palpitations or syncope.  He exercises 7 days weekly for 20 minutes on the treadmill and 20 minutes on the bike.  He occasionally will have some LE edema from his neuropathy.   Wt Readings from Last 3 Encounters:  07/13/13 229 lb (103.874 kg)  02/11/13 237 lb (107.502 kg)  10/10/12 235 lb (106.595 kg)     Past Medical History  Diagnosis Date  . Aortic stenosis, mild   . Hypertension   . Dyslipidemia   . Myocardial infarction   . Heart murmur secondary to mild AS   . Chronic kidney disease     nocturia  . Arthritis   . Neuromuscular disorder     neuropathy  . Carotid artery stenosis     bilateral,mild  . Coronary artery disease     CABG 2005, repeat cath with severe 2 vessel ASCAD with high grade stenosis of LAD, patent LIMA to LAD, patent SVG too diag, mildly atretic but widely patent SVG to RCA and high grade stenosis 90% ostial left circ with left dominant with PDA coming off of the left circ s/p rotational atherectomy and cutting balloon PCI with stent to prox left circ    Current Outpatient Prescriptions  Medication Sig Dispense Refill  . atorvastatin (LIPITOR) 10 MG tablet Take 1 tablet by mouth daily.      Marland Kitchen aspirin 325 MG tablet Take 325 mg by mouth daily.      Marland Kitchen lidocaine (LIDODERM) 5 % Place 1 patch onto the skin daily as needed. Remove & Discard patch within 12 hours or as directed by MD: used on left foot for pain      . Multiple Vitamins-Minerals (CENTRUM SILVER PO) Take 1 tablet by mouth daily.      . pantoprazole (PROTONIX) 40 MG tablet Take  40 mg by mouth daily.      . tamsulosin (FLOMAX) 0.4 MG CAPS Take 0.8 mg by mouth daily.       No current facility-administered medications for this visit.    Allergies:    Allergies  Allergen Reactions  . Cardura [Doxazosin Mesylate]     Hypotenstion  . Gabapentin     Weight gain    Social History:  The patient  reports that he quit smoking about 34 years ago. He has never used smokeless tobacco. He reports that he drinks about 0.6 ounces of alcohol per week. He reports that he does not use illicit drugs.   Family History:  The patient's family history includes Arrhythmia in his mother; Heart attack in his father.   ROS:  Please see the history of present illness.      All other systems reviewed and negative.   PHYSICAL EXAM: VS:  BP 112/68  Pulse 74  Ht 5\' 10"  (1.778 m)  Wt 229 lb (103.874 kg)  BMI 32.86 kg/m2 Well nourished, well developed, in no acute distress HEENT: normal Neck: no JVD Cardiac:  normal S1, S2; RRR; 2/6 systolic murmur at RUSB to LLSB Lungs:  clear  to auscultation bilaterally, no wheezing, rhonchi or rales Abd: soft, nontender, no hepatomegaly Ext: no edema Skin: warm and dry Neuro:  CNs 2-12 intact, no focal abnormalities noted  EKG:     NSR at 74bpm with LVH with repolarization abnormality  ASSESSMENT AND PLAN:  1.  ASCAD with no angina but his EKG is slightly different with some new T wave changes in the lateral precordial leads  - decrease ASA to 81mg  daily  - nuclear stress test to rule out ischemia given his new EKG changes.  He is going to have a hernia repair done in the near future 2.  Dyslipidemia  - continue atorvastatin  - check fasting lipids and ALT 3.   Mild AS  - recheck 2D echo to assess for progression - he has not had one done since 2013 4.  HTN - well controlled  Followup with me in 1 year  Signed, Armanda Magicraci Turner, MD 07/13/2013 10:53 AM

## 2013-07-15 ENCOUNTER — Encounter: Payer: Self-pay | Admitting: Podiatry

## 2013-07-15 ENCOUNTER — Ambulatory Visit (INDEPENDENT_AMBULATORY_CARE_PROVIDER_SITE_OTHER): Payer: Medicare Other | Admitting: Podiatry

## 2013-07-15 VITALS — BP 119/68 | HR 74 | Ht 70.0 in | Wt 229.0 lb

## 2013-07-15 DIAGNOSIS — M79606 Pain in leg, unspecified: Secondary | ICD-10-CM

## 2013-07-15 DIAGNOSIS — B351 Tinea unguium: Secondary | ICD-10-CM

## 2013-07-15 DIAGNOSIS — M79609 Pain in unspecified limb: Secondary | ICD-10-CM

## 2013-07-15 DIAGNOSIS — L600 Ingrowing nail: Secondary | ICD-10-CM

## 2013-07-15 DIAGNOSIS — M79675 Pain in left toe(s): Secondary | ICD-10-CM

## 2013-07-15 HISTORY — DX: Pain in leg, unspecified: M79.606

## 2013-07-15 NOTE — Patient Instructions (Signed)
Seen for hypertrophic and ingrown nails. All nails debrided. Return in 3 months or as needed.  

## 2013-07-15 NOTE — Progress Notes (Signed)
Subjective: 80 year old male patient presents complaining of painful ingrown nails on both great toe nails.  Unable to clip nail because he has broken pelvic bone.  Has Neuropathy at anterior leg to dorsum of foot x 15 years. Could not take Lyrica due to weight gain.  Objective:  Painful ingrown nail on both great toe both borders. No edema or erythema noted.  No abnormal skin lesions noted.  Pedal pulses are all palpable.  Severe Hallux valgus with bunion left.  Right foot has previous bunion surgery, and has maintained correction.   Assessment: Painful ingrown nail on both great toe nails.  Onychomycosis x 10.  Idiopathic Neuropathy lower limb.   Plan: All nails debrided.  Return in 3 months or as needed.

## 2013-08-01 ENCOUNTER — Other Ambulatory Visit (INDEPENDENT_AMBULATORY_CARE_PROVIDER_SITE_OTHER): Payer: Medicare Other

## 2013-08-01 ENCOUNTER — Ambulatory Visit (HOSPITAL_BASED_OUTPATIENT_CLINIC_OR_DEPARTMENT_OTHER): Payer: Medicare Other | Admitting: Radiology

## 2013-08-01 ENCOUNTER — Ambulatory Visit (HOSPITAL_COMMUNITY): Payer: Medicare Other | Attending: Cardiology | Admitting: Radiology

## 2013-08-01 ENCOUNTER — Other Ambulatory Visit: Payer: Self-pay | Admitting: General Surgery

## 2013-08-01 ENCOUNTER — Encounter: Payer: Self-pay | Admitting: Cardiology

## 2013-08-01 VITALS — BP 151/80 | Ht 70.0 in | Wt 229.0 lb

## 2013-08-01 DIAGNOSIS — I080 Rheumatic disorders of both mitral and aortic valves: Secondary | ICD-10-CM | POA: Insufficient documentation

## 2013-08-01 DIAGNOSIS — I350 Nonrheumatic aortic (valve) stenosis: Secondary | ICD-10-CM

## 2013-08-01 DIAGNOSIS — Z8249 Family history of ischemic heart disease and other diseases of the circulatory system: Secondary | ICD-10-CM | POA: Insufficient documentation

## 2013-08-01 DIAGNOSIS — Z87891 Personal history of nicotine dependence: Secondary | ICD-10-CM | POA: Insufficient documentation

## 2013-08-01 DIAGNOSIS — I379 Nonrheumatic pulmonary valve disorder, unspecified: Secondary | ICD-10-CM | POA: Insufficient documentation

## 2013-08-01 DIAGNOSIS — I2510 Atherosclerotic heart disease of native coronary artery without angina pectoris: Secondary | ICD-10-CM

## 2013-08-01 DIAGNOSIS — E785 Hyperlipidemia, unspecified: Secondary | ICD-10-CM | POA: Insufficient documentation

## 2013-08-01 DIAGNOSIS — I129 Hypertensive chronic kidney disease with stage 1 through stage 4 chronic kidney disease, or unspecified chronic kidney disease: Secondary | ICD-10-CM | POA: Insufficient documentation

## 2013-08-01 DIAGNOSIS — I359 Nonrheumatic aortic valve disorder, unspecified: Secondary | ICD-10-CM

## 2013-08-01 DIAGNOSIS — I779 Disorder of arteries and arterioles, unspecified: Secondary | ICD-10-CM | POA: Insufficient documentation

## 2013-08-01 DIAGNOSIS — Z951 Presence of aortocoronary bypass graft: Secondary | ICD-10-CM | POA: Insufficient documentation

## 2013-08-01 DIAGNOSIS — I252 Old myocardial infarction: Secondary | ICD-10-CM | POA: Insufficient documentation

## 2013-08-01 DIAGNOSIS — N189 Chronic kidney disease, unspecified: Secondary | ICD-10-CM | POA: Insufficient documentation

## 2013-08-01 LAB — ALT: ALT: 17 U/L (ref 0–53)

## 2013-08-01 MED ORDER — TECHNETIUM TC 99M SESTAMIBI GENERIC - CARDIOLITE
30.0000 | Freq: Once | INTRAVENOUS | Status: AC | PRN
  Administered 2013-08-01: 30 via INTRAVENOUS

## 2013-08-01 MED ORDER — TECHNETIUM TC 99M SESTAMIBI GENERIC - CARDIOLITE
10.0000 | Freq: Once | INTRAVENOUS | Status: AC | PRN
  Administered 2013-08-01: 10 via INTRAVENOUS

## 2013-08-01 NOTE — Progress Notes (Signed)
Echocardiogram performed.  

## 2013-08-01 NOTE — Progress Notes (Signed)
[facility] SITE 3 NUCLEAR MED [address] [place], [place] [address] [phone]    Cardiology Nuclear Med Study  Jesus [name] is a 80 y.o. male     MRN : [id number]     DOB: 04/25/1929  Procedure Date: 08/01/2013  Nuclear Med Background Indication for Stress Test:  Evaluation for Ischemia, Graft Patency and Stent Patency History:CAD;MI;Cath;CABG;Stents (Circ);Echo 05' EF 50%;Previous Nuclear Study 2 yrs ago [facility] Cardiac Risk Factors: Carotid Disease, Family History - CAD, History of Smoking, Hypertension and Lipids  Symptoms:  No known symptoms   Nuclear Pre-Procedure Caffeine/Decaff Intake:  None > 12 hrs NPO After: 7:00pm   Lungs:  clear O2 Sat: 96% on room air. IV 0.9% NS with Angio Cath:  22g  IV Site: R Antecubital x 1, tolerated well IV Started by:  Irean HongPatsy Edwards, RN  Chest Size (in):  46 Cup Size: n/a  Height: 5\' 10"  (1.778 m)  Weight:  229 lb (103.874 kg)  BMI:  Body mass index is 32.86 kg/(m^2). Tech Comments:  N/A    Nuclear Med Study 1 or 2 day study: 1 day  Stress Test Type:  Stress  Reading MD: N/A  Order Authorizing Provider:  Armanda Magicraci Turner, MD  Resting Radionuclide: Technetium 8828m Sestamibi  Resting Radionuclide Dose: 11.0 mCi   Stress Radionuclide:  Technetium 228m Sestamibi  Stress Radionuclide Dose: 33.0 mCi           Stress Protocol Rest HR: 62 Stress HR: 129  Rest BP: 151/80 Stress BP: 164/71  Exercise Time (min): 3:49 METS: 5.50   Predicted Max HR: 138 bpm % Max HR: 93.48 bpm Rate Pressure Product: [id number]   Dose of Adenosine (mg):  n/a Dose of Lexiscan: n/a mg  Dose of Atropine (mg): n/a Dose of Dobutamine: n/a mcg/kg/min (at max HR)  Stress Test Technologist: Frederick Peerseresa Johnson, EMT-P  Nuclear Technologist:  Domenic PoliteStephen Carbone, CNMT     Rest Procedure:  Myocardial perfusion imaging was performed at rest 45 minutes following the intravenous administration of Technetium 5928m Sestamibi. Rest ECG: NSR - Normal EKG  Stress  Procedure:  The patient exercised on the treadmill utilizing the Bruce Protocol for 3:49 minutes. The patient stopped due to SOB and denied any chest pain.  Technetium 5628m Sestamibi was injected at peak exercise and myocardial perfusion imaging was performed after a brief delay. Stress ECG: ST depression in lead II only  Not significant for ischemia.    QPS Raw Data Images: SOft tissue (diaphragm, bowel) underlie heart.   Stress Images:Larg defect in the inferolateral wall (base, mid, minimally distal), anterolateral wall (base, mid), inferior (base)  Otherwise normal perfusion Rest Images: Mild improvement from the stress images with increased counds tin the distal lateral wall   Subtraction (SDS):  Minimal periinfarct ischemia   Transient Ischemic Dilatation (Normal <1.22):  1.04 Lung/Heart Ratio (Normal <0.45):  0.26  Quantitative Gated Spect Images QGS EDV:  141 ml QGS ESV:  75 ml  Impression Exercise Capacity:  Poor exercise capacity. BP Response:  Normal blood pressure response. Clinical Symptoms:  No chest pain. ECG Impression:  No significant ST segment change suggestive of ischemia. Comparison with Prior Nuclear Study:[facility]  Images are not availablt to compare.  Report not available    Overall Impression:  Moderate inferolateral and anterolateral defect consistent with scar and soft tissue attnenuation with minimal periinfarct ischemia.    LV Ejection Fraction: 47%.  LV Wall Motion: Lateral hypokinesis.    Jesus FusiPaula  [name]

## 2013-08-02 LAB — NMR LIPOPROFILE WITH LIPIDS
CHOLESTEROL, TOTAL: 127 mg/dL (ref <200)
HDL Particle Number: 34.9 umol/L (ref >=30.5)
HDL SIZE: 9.5 nm (ref >=9.2)
HDL-C: 50 mg/dL (ref >=40)
LDL CALC: 51 mg/dL (ref <100)
LDL PARTICLE NUMBER: 579 nmol/L (ref <1000)
LDL SIZE: 19.9 nm — AB (ref >20.5)
LP-IR SCORE: 33 (ref <=45)
Large HDL-P: 8.9 umol/L (ref >=4.8)
Large VLDL-P: 1.9 nmol/L (ref <=2.7)
SMALL LDL PARTICLE NUMBER: 406 nmol/L (ref <=527)
Triglycerides: 132 mg/dL (ref <150)
VLDL SIZE: 43.2 nm (ref <=46.6)

## 2013-08-03 ENCOUNTER — Telehealth: Payer: Self-pay | Admitting: Cardiology

## 2013-08-03 ENCOUNTER — Other Ambulatory Visit: Payer: Self-pay | Admitting: General Surgery

## 2013-08-03 DIAGNOSIS — E785 Hyperlipidemia, unspecified: Secondary | ICD-10-CM

## 2013-08-03 NOTE — Telephone Encounter (Signed)
New message  ° ° °Patient calling for test results.   °

## 2013-08-03 NOTE — Telephone Encounter (Signed)
Gave pt cholesterol results. 

## 2013-10-12 ENCOUNTER — Ambulatory Visit: Payer: Medicare Other | Admitting: Podiatry

## 2013-11-01 ENCOUNTER — Encounter: Payer: Self-pay | Admitting: Podiatry

## 2013-11-01 ENCOUNTER — Ambulatory Visit (INDEPENDENT_AMBULATORY_CARE_PROVIDER_SITE_OTHER): Payer: Medicare Other | Admitting: Podiatry

## 2013-11-01 VITALS — BP 125/77 | HR 65 | Ht 70.0 in | Wt 229.0 lb

## 2013-11-01 DIAGNOSIS — M79606 Pain in leg, unspecified: Secondary | ICD-10-CM

## 2013-11-01 DIAGNOSIS — B351 Tinea unguium: Secondary | ICD-10-CM

## 2013-11-01 DIAGNOSIS — M79609 Pain in unspecified limb: Secondary | ICD-10-CM

## 2013-11-01 DIAGNOSIS — L600 Ingrowing nail: Secondary | ICD-10-CM

## 2013-11-01 NOTE — Progress Notes (Signed)
Subjective: 80 year old male patient presents complaining of pain and numbness on left foot. Also has tender toe nail on left foot.   Objective:  Painful ingrown nail on both great toe both borders. No edema or erythema noted.  Pain and numbness on forefoot left especially the 2nd digit left.  Pedal pulses are all palpable.  Severe Hallux valgus with bunion left.  Right foot has previous bunion surgery, and has maintained correction.   Assessment: Painful ingrown nail left great toe nails.  Onychomycosis x 10.  Idiopathic Neuropathy lower limb especially left forefoot  Plan: All nails debrided.  Compounding cream prescribed for left foot pain. Return in 3 months or as needed.

## 2013-11-01 NOTE — Patient Instructions (Signed)
Seen for left foot pain. May benefit from compound cream. Left great toe nail is ingrown. All nails debrided. Return in 3 month.

## 2013-11-15 ENCOUNTER — Ambulatory Visit (INDEPENDENT_AMBULATORY_CARE_PROVIDER_SITE_OTHER): Payer: Medicare Other | Admitting: Surgery

## 2013-11-15 ENCOUNTER — Other Ambulatory Visit (INDEPENDENT_AMBULATORY_CARE_PROVIDER_SITE_OTHER): Payer: Self-pay

## 2013-11-15 ENCOUNTER — Encounter (INDEPENDENT_AMBULATORY_CARE_PROVIDER_SITE_OTHER): Payer: Self-pay | Admitting: Surgery

## 2013-11-15 ENCOUNTER — Encounter (INDEPENDENT_AMBULATORY_CARE_PROVIDER_SITE_OTHER): Payer: Self-pay

## 2013-11-15 VITALS — BP 126/80 | HR 78 | Temp 98.0°F | Resp 18 | Ht 74.0 in | Wt 218.0 lb

## 2013-11-15 DIAGNOSIS — K432 Incisional hernia without obstruction or gangrene: Secondary | ICD-10-CM

## 2013-11-15 HISTORY — DX: Incisional hernia without obstruction or gangrene: K43.2

## 2013-11-15 NOTE — Progress Notes (Signed)
Re:   Jesus Perlorman F Morea DOB:   07/23/1928 MRN:   [id number]  ASSESSMENT AND PLAN: 1.  Ventral incision hernia - 8 x 10 cm on exam  I discussed the indications and complications of hernia surgery with the patient.  I discussed both the laparoscopic and open approach to hernia repair..  The potential risks of hernia surgery include, but are not limited to, bleeding, infection, open surgery, nerve injury, and recurrence of the hernia.  I provided the patient literature about hernia surgery.  Plan: 1) Cardiac clearance, 2) CT scan of abdomen to exam the size of the hernia, 3) return to the office to discuss possible surgery.  It would be a significant operation and he is somewhat hesitant.  2.  HTN 3.  CAD  CABG - 2005  Stent placed about 2010.  08/01/2013 - Nuc myocardial perfusion - Moderate inferolateral and anterolateral defect consistent with scar and soft tissue attnenuation with minimal periinfarct ischemia. LV Ejection Fraction: 47%. LV Wall Motion: Lateral hypokinesis.   Sees Dr. Armanda Magicraci Turner - she sees him once a year.  She told him that he was okay to have surgery, but we will contact her office for cardiac clearance. 4.  Neuropathy  Both feet, etiology. 5.  BPH 6.  GERD, but stopped meds about one year ago.  Chief Complaint  Patient presents with  . New Evaluation    hernia   REFERRING PHYSICIAN: ROSS,ALLAN, MD  HISTORY OF PRESENT ILLNESS: Jesus [name] is a 80 y.o. (DOB: 09/03/1928)  white  male whose primary care physician is ROSS,ALLAN, MD and comes to me today for an abdominal wall hernia. He is accompanied with his son, Lise AuerBilly Dafoe.  Genevie [place]illy is from [place].  He has another son who lives near, but is working.  Exploratory laparotomy, enterolysis - 10/08/2005 - [facility].  He also had an open cholecystectomy in 1972. He had part of the wound that required a delayed healing.  About 3 years ago, he noticed a bulge in his abdominal wall.  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  The  hernia got larger, though he thinks that over the last 6 to 9 months, it has been stable.  It has gotten to the point where it bothers him. He is active that he goes to the gym regularly.  His heart is not bothering him.  He has lost about 15 pounds intentionally.  Past Medical History  Diagnosis Date  . Aortic stenosis, mild   . Hypertension   . Dyslipidemia   . Myocardial infarction   . Heart murmur   . Chronic kidney disease     nocturia  . Arthritis   . Neuromuscular disorder     neuropathy  . Carotid artery stenosis     bilateral,mild  . Coronary artery disease     CABG 2005, repeat cath with severe 2 vessel ASCAD with high grade stenosis of LAD, patent LIMA to LAD, patent SVG too diag, mildly atretic but widely patent SVG to RCA and high grade stenosis 90% ostial left circ with left dominant with PDA coming off of the left circ s/p rotational atherectomy and cutting balloon PCI with stent to prox left circ      Past Surgical History  Procedure Laterality Date  . Coronary artery bypass graft    . Prostate biopsy    . Angiogram  12/18/2011  . Artherectomy    . Cholecystectomy  1972  . Abdominal exploration surgery    .  Cataracts    . Cardiac catheterization      Showing Severe 2 vessel ASCAD w high grade stenosis of LAD,patent LIMA to LAD,patent SV to diagonal,mildly atretic but widely patent SVG to RCA and high grade 90% ostial left circumflex dominat system w PDA off left circ s/p rotational atherectomy,cutting blloon PCI w stent to proximal left circ      Current Outpatient Prescriptions  Medication Sig Dispense Refill  . aspirin EC 81 MG tablet Take 1 tablet (81 mg total) by mouth daily.      Marland Kitchen. atorvastatin (LIPITOR) 10 MG tablet       . lamoTRIgine (LAMICTAL) 200 MG tablet       . Multiple Vitamins-Minerals (CENTRUM SILVER PO) Take 1 tablet by mouth daily.      . pantoprazole (PROTONIX) 40 MG tablet       . tamsulosin (FLOMAX) 0.4 MG CAPS Take 0.8 mg by mouth daily.        No current facility-administered medications for this visit.    Allergies  Allergen Reactions  . Cardura [Doxazosin Mesylate]     Hypotenstion  . Gabapentin     Weight gain    REVIEW OF SYSTEMS: Skin:  No history of rash.  No history of abnormal moles. Infection:  No history of hepatitis or HIV.  No history of MRSA. Neurologic:  Neuropathy of both feet.  Cause unknown.  Uses cream on feet. Cardiac:  HTN.   CAD. CABG - 2005. Stent placed 2010.  Sees Dr. Armanda Magicraci Turner - she sees him once a year.   Pulmonary:  Does not smoke cigarettes.  No asthma or bronchitis.  No OSA/CPAP.  Endocrine:  No diabetes. No thyroid disease. Gastrointestinal:  See HPI. GERD, but stopped meds about 1 year ago.  No history of liver disease.  Open cholecystectomy - 1972. No history of pancreas disease.  No history of colon disease. Urologic:  BPH, followed by Dr. Annabell HowellsWrenn Musculoskeletal:  No history of joint or back disease. Hematologic:  No bleeding disorder.  No history of anemia.  Not anticoagulated. Psycho-social:  The patient is oriented.   The patient has no obvious psychologic or social impairment to understanding our conversation and plan.  SOCIAL and FAMILY HISTORY: Widower. Lives by self. He is accompanied with his son, Lise AuerBilly Denn.  Genevie CheshireBilly is from [place].  He has another son who lives nearer, but is working.  PHYSICAL EXAM: BP 126/80  Pulse 78  Temp(Src) 98 F (36.7 C)  Resp 18  Ht 6\' 2"  (1.88 m)  Wt 218 lb (98.884 kg)  BMI 27.98 kg/m2  General: WN slightly obese WM who is alert.  HEENT: Normal. Pupils equal. Neck: Supple. No mass.  No thyroid mass. Lymph Nodes:  No supraclavicular or cervical nodes. Lungs: Clear to auscultation and symmetric breath sounds. Heart:  RRR. Has 3/6 systolic murmur. Abdomen: Soft. Has lower midline abdominal wall hernia.  The edges feel about 8 x 10 cm.  The hernia is soft and reducible. Rectal: Not done. Extremities:  Good strength and ROM  in upper and  lower extremities. Neurologic:  Grossly intact to motor and sensory function. Psychiatric: Has normal mood and affect. Behavior is normal.   DATA REVIEWED: Notes from Dr. Tenny Crawoss.  Epic notes.  Ovidio Kinavid Newman, MD,  [facility] [address]  [address]   [place], [place]    [id number] Phone:  [phone] FAX:  [phone]

## 2013-11-16 ENCOUNTER — Other Ambulatory Visit (INDEPENDENT_AMBULATORY_CARE_PROVIDER_SITE_OTHER): Payer: Self-pay | Admitting: *Deleted

## 2013-11-16 DIAGNOSIS — K432 Incisional hernia without obstruction or gangrene: Secondary | ICD-10-CM

## 2013-11-17 ENCOUNTER — Telehealth (INDEPENDENT_AMBULATORY_CARE_PROVIDER_SITE_OTHER): Payer: Self-pay

## 2013-11-17 NOTE — Telephone Encounter (Signed)
Jesus [facility] [name] Jesus [name]/Jesus [name] called to state patient has not had BUN/CRET drawn so she will need it before Ct 11/18/13, Patient will need to arrive at 12:30 for labs then Ct @1 :30 [employer]ax[employer] Jesus [name] Lab Order [id number] . Patient message left on V/M

## 2013-11-18 ENCOUNTER — Encounter (HOSPITAL_COMMUNITY): Payer: Self-pay

## 2013-11-18 ENCOUNTER — Ambulatory Visit (HOSPITAL_COMMUNITY)
Admission: RE | Admit: 2013-11-18 | Discharge: 2013-11-18 | Disposition: A | Discharge status: Home / Self Care | Payer: Medicare Other | Source: Ambulatory Visit | Attending: Surgery | Admitting: Surgery

## 2013-11-18 DIAGNOSIS — N269 Renal sclerosis, unspecified: Secondary | ICD-10-CM | POA: Insufficient documentation

## 2013-11-18 DIAGNOSIS — M47817 Spondylosis without myelopathy or radiculopathy, lumbosacral region: Secondary | ICD-10-CM | POA: Insufficient documentation

## 2013-11-18 DIAGNOSIS — N400 Enlarged prostate without lower urinary tract symptoms: Secondary | ICD-10-CM | POA: Insufficient documentation

## 2013-11-18 DIAGNOSIS — K7689 Other specified diseases of liver: Secondary | ICD-10-CM | POA: Insufficient documentation

## 2013-11-18 DIAGNOSIS — K439 Ventral hernia without obstruction or gangrene: Secondary | ICD-10-CM | POA: Insufficient documentation

## 2013-11-18 DIAGNOSIS — J984 Other disorders of lung: Secondary | ICD-10-CM | POA: Insufficient documentation

## 2013-11-18 DIAGNOSIS — I2510 Atherosclerotic heart disease of native coronary artery without angina pectoris: Secondary | ICD-10-CM | POA: Insufficient documentation

## 2013-11-18 DIAGNOSIS — M47814 Spondylosis without myelopathy or radiculopathy, thoracic region: Secondary | ICD-10-CM | POA: Insufficient documentation

## 2013-11-18 DIAGNOSIS — R109 Unspecified abdominal pain: Secondary | ICD-10-CM | POA: Insufficient documentation

## 2013-11-18 DIAGNOSIS — K5730 Diverticulosis of large intestine without perforation or abscess without bleeding: Secondary | ICD-10-CM | POA: Insufficient documentation

## 2013-11-18 DIAGNOSIS — J9819 Other pulmonary collapse: Secondary | ICD-10-CM | POA: Insufficient documentation

## 2013-11-18 MED ORDER — IOHEXOL 300 MG/ML  SOLN
100.0000 mL | Freq: Once | INTRAMUSCULAR | Status: AC | PRN
  Administered 2013-11-18: 100 mL via INTRAVENOUS

## 2013-11-21 ENCOUNTER — Other Ambulatory Visit (INDEPENDENT_AMBULATORY_CARE_PROVIDER_SITE_OTHER): Payer: Self-pay

## 2013-11-21 ENCOUNTER — Encounter (INDEPENDENT_AMBULATORY_CARE_PROVIDER_SITE_OTHER): Payer: Self-pay

## 2013-11-24 ENCOUNTER — Ambulatory Visit (INDEPENDENT_AMBULATORY_CARE_PROVIDER_SITE_OTHER): Payer: Medicare Other | Admitting: Surgery

## 2013-12-08 ENCOUNTER — Telehealth: Payer: Self-pay | Admitting: *Deleted

## 2013-12-08 NOTE — Telephone Encounter (Signed)
12/08/13  Dr. Raynald KempSheard , For your information, I received a call from [facility] regarding a request for a refill on his topical cream rx. The patient name was Jesus [name]. Their phone number [phone]. I told her it ws ok to refill x one time. Just wanted you to know.

## 2013-12-11 ENCOUNTER — Encounter (HOSPITAL_BASED_OUTPATIENT_CLINIC_OR_DEPARTMENT_OTHER): Payer: Self-pay | Admitting: Emergency Medicine

## 2013-12-11 ENCOUNTER — Emergency Department (HOSPITAL_BASED_OUTPATIENT_CLINIC_OR_DEPARTMENT_OTHER)
Admission: EM | Admit: 2013-12-11 | Discharge: 2013-12-11 | Disposition: A | Discharge status: Home / Self Care | Payer: Medicare Other | Source: Home / Self Care | Attending: Emergency Medicine | Admitting: Emergency Medicine

## 2013-12-11 DIAGNOSIS — Z7982 Long term (current) use of aspirin: Secondary | ICD-10-CM | POA: Diagnosis not present

## 2013-12-11 DIAGNOSIS — Z79899 Other long term (current) drug therapy: Secondary | ICD-10-CM | POA: Diagnosis not present

## 2013-12-11 DIAGNOSIS — I129 Hypertensive chronic kidney disease with stage 1 through stage 4 chronic kidney disease, or unspecified chronic kidney disease: Secondary | ICD-10-CM | POA: Diagnosis present

## 2013-12-11 DIAGNOSIS — Z87891 Personal history of nicotine dependence: Secondary | ICD-10-CM | POA: Diagnosis not present

## 2013-12-11 DIAGNOSIS — I658 Occlusion and stenosis of other precerebral arteries: Secondary | ICD-10-CM | POA: Diagnosis present

## 2013-12-11 DIAGNOSIS — L02619 Cutaneous abscess of unspecified foot: Secondary | ICD-10-CM | POA: Diagnosis present

## 2013-12-11 DIAGNOSIS — N189 Chronic kidney disease, unspecified: Secondary | ICD-10-CM | POA: Diagnosis present

## 2013-12-11 DIAGNOSIS — L03119 Cellulitis of unspecified part of limb: Secondary | ICD-10-CM | POA: Diagnosis present

## 2013-12-11 DIAGNOSIS — Z951 Presence of aortocoronary bypass graft: Secondary | ICD-10-CM | POA: Diagnosis not present

## 2013-12-11 DIAGNOSIS — N400 Enlarged prostate without lower urinary tract symptoms: Secondary | ICD-10-CM | POA: Diagnosis present

## 2013-12-11 DIAGNOSIS — I359 Nonrheumatic aortic valve disorder, unspecified: Secondary | ICD-10-CM | POA: Diagnosis present

## 2013-12-11 DIAGNOSIS — L03116 Cellulitis of left lower limb: Secondary | ICD-10-CM

## 2013-12-11 DIAGNOSIS — Z888 Allergy status to other drugs, medicaments and biological substances status: Secondary | ICD-10-CM | POA: Diagnosis not present

## 2013-12-11 DIAGNOSIS — I252 Old myocardial infarction: Secondary | ICD-10-CM | POA: Diagnosis not present

## 2013-12-11 DIAGNOSIS — E785 Hyperlipidemia, unspecified: Secondary | ICD-10-CM | POA: Diagnosis present

## 2013-12-11 DIAGNOSIS — Z9861 Coronary angioplasty status: Secondary | ICD-10-CM | POA: Diagnosis not present

## 2013-12-11 DIAGNOSIS — L02419 Cutaneous abscess of limb, unspecified: Secondary | ICD-10-CM | POA: Diagnosis present

## 2013-12-11 DIAGNOSIS — Z8249 Family history of ischemic heart disease and other diseases of the circulatory system: Secondary | ICD-10-CM | POA: Diagnosis not present

## 2013-12-11 DIAGNOSIS — Z9089 Acquired absence of other organs: Secondary | ICD-10-CM | POA: Diagnosis not present

## 2013-12-11 DIAGNOSIS — L259 Unspecified contact dermatitis, unspecified cause: Secondary | ICD-10-CM | POA: Diagnosis present

## 2013-12-11 DIAGNOSIS — I6529 Occlusion and stenosis of unspecified carotid artery: Secondary | ICD-10-CM | POA: Diagnosis present

## 2013-12-11 DIAGNOSIS — I2510 Atherosclerotic heart disease of native coronary artery without angina pectoris: Secondary | ICD-10-CM | POA: Diagnosis present

## 2013-12-11 MED ORDER — CEPHALEXIN 250 MG PO CAPS
ORAL_CAPSULE | ORAL | Status: AC
  Filled 2013-12-11: qty 2

## 2013-12-11 MED ORDER — SULFAMETHOXAZOLE-TMP DS 800-160 MG PO TABS: 1.0000 | ORAL_TABLET | Freq: Two times a day (BID) | ORAL | Status: DC

## 2013-12-11 MED ORDER — TETANUS-DIPHTH-ACELL PERTUSSIS 5-2.5-18.5 LF-MCG/0.5 IM SUSP
0.5000 mL | Freq: Once | INTRAMUSCULAR | Status: AC
Start: 2013-12-11 — End: 2013-12-11
  Administered 2013-12-11: 0.5 mL via INTRAMUSCULAR
  Filled 2013-12-11: qty 0.5

## 2013-12-11 MED ORDER — SULFAMETHOXAZOLE-TMP DS 800-160 MG PO TABS
ORAL_TABLET | ORAL | Status: AC
  Filled 2013-12-11: qty 1

## 2013-12-11 MED ORDER — CEPHALEXIN 500 MG PO CAPS: ORAL_CAPSULE | ORAL | Status: DC

## 2013-12-11 NOTE — ED Provider Notes (Signed)
CSN: [id number]     Arrival date & time 12/11/13  1726 History  This chart was scribed for Jesus HornJohn M Amarilis Belflower, MD by Milly JakobJohn Lee [name], ED Scribe. The patient was seen in room MH02/MH02. Patient's care was started at 6:27 PM.      Chief Complaint  Patient presents with  . Wound Infection   HPI HPI Comments: Jesus [name] is a 80 y.o. male with a history of heart disease who presents to the Emergency Department complaining of left foot pain, redness, and weeping and oozing onset 1 week ago. He reports that today he developed increased redness above his left ankle. He reports that he saw a pediatrist for a toenail trim and checkup a week ago prior to developing his foot redness, and was given a topical medication (incluiding 6 neuropathy medications) for his chronic neuropathy in his left foot. He states that after his nails were trimmed and applying this cream his foot began to swell. He denies new weakness or numbness, fever and vomiting. He reports that his Tetanus is not UTD.   Past Medical History  Diagnosis Date  . Aortic stenosis, mild   . Hypertension   . Dyslipidemia   . Myocardial infarction   . Heart murmur   . Chronic kidney disease     nocturia  . Arthritis   . Neuromuscular disorder     neuropathy  . Carotid artery stenosis     bilateral,mild  . Coronary artery disease     CABG 2005, repeat cath with severe 2 vessel ASCAD with high grade stenosis of LAD, patent LIMA to LAD, patent SVG too diag, mildly atretic but widely patent SVG to RCA and high grade stenosis 90% ostial left circ with left dominant with PDA coming off of the left circ s/p rotational atherectomy and cutting balloon PCI with stent to prox left circ   Past Surgical History  Procedure Laterality Date  . Coronary artery bypass graft    . Prostate biopsy    . Angiogram  12/18/2011  . Artherectomy    . Cholecystectomy  1972  . Abdominal exploration surgery    . Cataracts    . Cardiac catheterization      Showing  Severe 2 vessel ASCAD w high grade stenosis of LAD,patent LIMA to LAD,patent SV to diagonal,mildly atretic but widely patent SVG to RCA and high grade 90% ostial left circumflex dominat system w PDA off left circ s/p rotational atherectomy,cutting blloon PCI w stent to proximal left circ   Family History  Problem Relation Age of Onset  . Heart attack Father   . Arrhythmia Mother    History  Substance Use Topics  . Smoking status: Former Smoker    Quit date: 06/20/1979  . Smokeless tobacco: Never Used  . Alcohol Use: 0.6 oz/week    1 Shots of liquor per week     Comment: daily    Review of Systems  10 Systems reviewed and are negative for acute change except as noted in the HPI.   Allergies  Cardura and Gabapentin  Home Medications   Prior to Admission medications   Medication Sig Start Date End Date Taking? Authorizing Provider  aspirin EC 81 MG tablet Take 1 tablet (81 mg total) by mouth daily. 07/13/13   Quintella Reichertraci R Turner, MD  atorvastatin (LIPITOR) 10 MG tablet  09/24/13   Historical Provider, MD  cephALEXin (KEFLEX) 500 MG capsule 2 caps po bid x 7 days 12/11/13   Jesus HornJohn M Lauryl Seyer,  MD  lamoTRIgine (LAMICTAL) 200 MG tablet  11/02/13   Historical Provider, MD  Multiple Vitamins-Minerals (CENTRUM SILVER PO) Take 1 tablet by mouth daily.    Historical Provider, MD  pantoprazole (PROTONIX) 40 MG tablet  08/26/13   Historical Provider, MD  sulfamethoxazole-trimethoprim (BACTRIM DS) 800-160 MG per tablet Take 1 tablet by mouth 2 (two) times daily. 12/11/13   Jesus Horn, MD  tamsulosin (FLOMAX) 0.4 MG CAPS Take 0.8 mg by mouth daily.    Historical Provider, MD   Triage Vitals: BP 125/77  Pulse 90  Temp(Src) 98.2 F (36.8 C) (Oral)  Resp 20  Ht 5\' 10"  (1.778 m)  Wt 222 lb (100.699 kg)  BMI 31.85 kg/m2  SpO2 97% Physical Exam  Nursing note and vitals reviewed. Constitutional:  Awake, alert, nontoxic appearance.  HENT:  Head: Atraumatic.  Eyes: Right eye exhibits no discharge. Left  eye exhibits no discharge.  Neck: Neck supple.  Cardiovascular: Normal rate and regular rhythm.   Murmur heard. Pulmonary/Chest: Effort normal and breath sounds normal. No respiratory distress. He has no wheezes. He has no rales. He exhibits no tenderness.  Abdominal: Soft. Bowel sounds are normal. There is no tenderness. There is no rebound and no guarding.  Musculoskeletal: He exhibits no tenderness.  Baseline ROM, no obvious new focal weakness. Left foot DP pulse in tact. CR less than 2 seconds. Baseline sensation to left foot. Left foot has mild edema, with erythema, increased warmth, with maceration and clear oozing between toes. With erythema tracking several cm's proximal to ankle. W/o tenderness to calf, thigh, or groin.    Neurological:  Mental status and motor strength appears baseline for patient and situation.  Skin: No rash noted.  Psychiatric: He has a normal mood and affect.    ED Course  Procedures (including critical care time) DIAGNOSTIC STUDIES: Oxygen Saturation is 97% on room air, normal by my interpretation.    COORDINATION OF CARE: Patient / Family / Caregiver understand and agree with initial ED impression and plan with expectations set for ED visit.  Labs Review Labs Reviewed - No data to display  Imaging Review No results found.   EKG Interpretation None      MDM   Final diagnoses:  Cellulitis of left foot    I doubt any other EMC precluding discharge at this time including, but not necessarily limited to the following:sepsis, nec. Fasc., compartment syndrome.  I personally performed the services described in this documentation, which was scribed in my presence. The recorded information has been reviewed and is accurate.    Jesus Horn, MD 12/12/13 2139

## 2013-12-11 NOTE — ED Notes (Signed)
Pt presents to ED with complaints of rt foot would infection and spreading up leg.

## 2013-12-12 ENCOUNTER — Telehealth: Payer: Self-pay | Admitting: Cardiology

## 2013-12-12 NOTE — Telephone Encounter (Signed)
Patient had nuclear stress test done in March 2015 which was low risk with  Moderate inferolateral and anterolateral scar with minimal periinfarct ischemia and mild LV dysfunction EF 47% - scan is low risk and he is fine for hernia surgery.  2D echo showed normal LVF.  He exercises daily and has not had any anginal symptoms.

## 2013-12-12 NOTE — Telephone Encounter (Signed)
Printed and faxed for pt.

## 2013-12-13 ENCOUNTER — Encounter (HOSPITAL_BASED_OUTPATIENT_CLINIC_OR_DEPARTMENT_OTHER): Payer: Self-pay | Admitting: Emergency Medicine

## 2013-12-13 ENCOUNTER — Telehealth: Payer: Self-pay | Admitting: Cardiology

## 2013-12-13 ENCOUNTER — Inpatient Hospital Stay (HOSPITAL_BASED_OUTPATIENT_CLINIC_OR_DEPARTMENT_OTHER)
Admission: EM | Admit: 2013-12-13 | Discharge: 2013-12-15 | DRG: 603 | Disposition: A | Discharge status: Home / Self Care | Payer: Medicare Other | Attending: Internal Medicine | Admitting: Internal Medicine

## 2013-12-13 DIAGNOSIS — Z9089 Acquired absence of other organs: Secondary | ICD-10-CM | POA: Diagnosis not present

## 2013-12-13 DIAGNOSIS — L259 Unspecified contact dermatitis, unspecified cause: Secondary | ICD-10-CM | POA: Diagnosis present

## 2013-12-13 DIAGNOSIS — I10 Essential (primary) hypertension: Secondary | ICD-10-CM

## 2013-12-13 DIAGNOSIS — E785 Hyperlipidemia, unspecified: Secondary | ICD-10-CM

## 2013-12-13 DIAGNOSIS — I129 Hypertensive chronic kidney disease with stage 1 through stage 4 chronic kidney disease, or unspecified chronic kidney disease: Secondary | ICD-10-CM | POA: Diagnosis present

## 2013-12-13 DIAGNOSIS — Z951 Presence of aortocoronary bypass graft: Secondary | ICD-10-CM | POA: Diagnosis not present

## 2013-12-13 DIAGNOSIS — Z7982 Long term (current) use of aspirin: Secondary | ICD-10-CM

## 2013-12-13 DIAGNOSIS — I359 Nonrheumatic aortic valve disorder, unspecified: Secondary | ICD-10-CM | POA: Diagnosis present

## 2013-12-13 DIAGNOSIS — I2510 Atherosclerotic heart disease of native coronary artery without angina pectoris: Secondary | ICD-10-CM | POA: Diagnosis present

## 2013-12-13 DIAGNOSIS — Z79899 Other long term (current) drug therapy: Secondary | ICD-10-CM

## 2013-12-13 DIAGNOSIS — L03119 Cellulitis of unspecified part of limb: Principal | ICD-10-CM

## 2013-12-13 DIAGNOSIS — Z87891 Personal history of nicotine dependence: Secondary | ICD-10-CM | POA: Diagnosis not present

## 2013-12-13 DIAGNOSIS — Z9861 Coronary angioplasty status: Secondary | ICD-10-CM

## 2013-12-13 DIAGNOSIS — I658 Occlusion and stenosis of other precerebral arteries: Secondary | ICD-10-CM | POA: Diagnosis present

## 2013-12-13 DIAGNOSIS — L03116 Cellulitis of left lower limb: Secondary | ICD-10-CM

## 2013-12-13 DIAGNOSIS — L02419 Cutaneous abscess of limb, unspecified: Secondary | ICD-10-CM | POA: Diagnosis present

## 2013-12-13 DIAGNOSIS — Z888 Allergy status to other drugs, medicaments and biological substances status: Secondary | ICD-10-CM

## 2013-12-13 DIAGNOSIS — Z8249 Family history of ischemic heart disease and other diseases of the circulatory system: Secondary | ICD-10-CM | POA: Diagnosis not present

## 2013-12-13 DIAGNOSIS — I252 Old myocardial infarction: Secondary | ICD-10-CM | POA: Diagnosis not present

## 2013-12-13 DIAGNOSIS — I6529 Occlusion and stenosis of unspecified carotid artery: Secondary | ICD-10-CM | POA: Diagnosis present

## 2013-12-13 DIAGNOSIS — L02619 Cutaneous abscess of unspecified foot: Secondary | ICD-10-CM | POA: Diagnosis present

## 2013-12-13 DIAGNOSIS — B351 Tinea unguium: Secondary | ICD-10-CM

## 2013-12-13 DIAGNOSIS — L600 Ingrowing nail: Secondary | ICD-10-CM

## 2013-12-13 DIAGNOSIS — I350 Nonrheumatic aortic (valve) stenosis: Secondary | ICD-10-CM

## 2013-12-13 DIAGNOSIS — K432 Incisional hernia without obstruction or gangrene: Secondary | ICD-10-CM

## 2013-12-13 DIAGNOSIS — L0390 Cellulitis, unspecified: Secondary | ICD-10-CM | POA: Diagnosis present

## 2013-12-13 DIAGNOSIS — M79675 Pain in left toe(s): Secondary | ICD-10-CM

## 2013-12-13 DIAGNOSIS — N189 Chronic kidney disease, unspecified: Secondary | ICD-10-CM | POA: Diagnosis present

## 2013-12-13 DIAGNOSIS — N400 Enlarged prostate without lower urinary tract symptoms: Secondary | ICD-10-CM | POA: Diagnosis present

## 2013-12-13 DIAGNOSIS — L03032 Cellulitis of left toe: Secondary | ICD-10-CM

## 2013-12-13 HISTORY — DX: Cellulitis of left lower limb: L03.116

## 2013-12-13 HISTORY — DX: Gastro-esophageal reflux disease without esophagitis: K21.9

## 2013-12-13 HISTORY — DX: Duodenal ulcer, unspecified as acute or chronic, without hemorrhage or perforation: K26.9

## 2013-12-13 HISTORY — DX: Personal history of other medical treatment: Z92.89

## 2013-12-13 LAB — CBC WITH DIFFERENTIAL/PLATELET
BASOS ABS: 0 10*3/uL (ref 0.0–0.1)
Basophils Relative: 0 % (ref 0–1)
Eosinophils Absolute: 0.5 10*3/uL (ref 0.0–0.7)
Eosinophils Relative: 7 % — ABNORMAL HIGH (ref 0–5)
HCT: 41.2 % (ref 39.0–52.0)
Hemoglobin: 13.8 g/dL (ref 13.0–17.0)
LYMPHS ABS: 1.3 10*3/uL (ref 0.7–4.0)
LYMPHS PCT: 17 % (ref 12–46)
MCH: 32.5 pg (ref 26.0–34.0)
MCHC: 33.5 g/dL (ref 30.0–36.0)
MCV: 96.9 fL (ref 78.0–100.0)
Monocytes Absolute: 0.8 10*3/uL (ref 0.1–1.0)
Monocytes Relative: 10 % (ref 3–12)
NEUTROS ABS: 5.1 10*3/uL (ref 1.7–7.7)
NEUTROS PCT: 66 % (ref 43–77)
PLATELETS: 236 10*3/uL (ref 150–400)
RBC: 4.25 MIL/uL (ref 4.22–5.81)
RDW: 12.5 % (ref 11.5–15.5)
WBC: 7.7 10*3/uL (ref 4.0–10.5)

## 2013-12-13 LAB — BASIC METABOLIC PANEL
ANION GAP: 13 (ref 5–15)
BUN: 23 mg/dL (ref 6–23)
CALCIUM: 9.1 mg/dL (ref 8.4–10.5)
CO2: 26 mEq/L (ref 19–32)
Chloride: 103 mEq/L (ref 96–112)
Creatinine, Ser: 1.8 mg/dL — ABNORMAL HIGH (ref 0.50–1.35)
GFR calc Af Amer: 38 mL/min — ABNORMAL LOW (ref >90)
GFR calc non Af Amer: 33 mL/min — ABNORMAL LOW (ref >90)
Glucose, Bld: 114 mg/dL — ABNORMAL HIGH (ref 70–99)
Potassium: 4.7 mEq/L (ref 3.7–5.3)
SODIUM: 142 meq/L (ref 137–147)

## 2013-12-13 MED ORDER — PIPERACILLIN-TAZOBACTAM 3.375 G IVPB 30 MIN
3.3750 g | Freq: Once | INTRAVENOUS | Status: AC
  Administered 2013-12-13: 3.375 g via INTRAVENOUS
  Filled 2013-12-13 (×2): qty 50

## 2013-12-13 MED ORDER — ACETAMINOPHEN 650 MG RE SUPP: 650.0000 mg | Freq: Four times a day (QID) | RECTAL | Status: DC | PRN

## 2013-12-13 MED ORDER — VANCOMYCIN HCL IN DEXTROSE 1-5 GM/200ML-% IV SOLN
INTRAVENOUS | Status: AC
  Administered 2013-12-13: 2 g
  Filled 2013-12-13: qty 400

## 2013-12-13 MED ORDER — PANTOPRAZOLE SODIUM 40 MG PO TBEC
40.0000 mg | DELAYED_RELEASE_TABLET | Freq: Every day | ORAL | Status: DC
  Administered 2013-12-15: 40 mg via ORAL
  Filled 2013-12-13 (×2): qty 1

## 2013-12-13 MED ORDER — ONDANSETRON HCL 4 MG/2ML IJ SOLN: 4.0000 mg | Freq: Four times a day (QID) | INTRAMUSCULAR | Status: DC | PRN

## 2013-12-13 MED ORDER — ASPIRIN EC 81 MG PO TBEC
81.0000 mg | DELAYED_RELEASE_TABLET | Freq: Every day | ORAL | Status: DC
  Administered 2013-12-14 – 2013-12-15 (×2): 81 mg via ORAL
  Filled 2013-12-13 (×2): qty 1

## 2013-12-13 MED ORDER — ACETAMINOPHEN 325 MG PO TABS
650.0000 mg | ORAL_TABLET | Freq: Four times a day (QID) | ORAL | Status: DC | PRN
  Administered 2013-12-14: 650 mg via ORAL
  Filled 2013-12-13: qty 2

## 2013-12-13 MED ORDER — VANCOMYCIN HCL 10 G IV SOLR
2000.0000 mg | Freq: Once | INTRAVENOUS | Status: DC
  Filled 2013-12-13: qty 2000

## 2013-12-13 MED ORDER — VANCOMYCIN HCL IN DEXTROSE 1-5 GM/200ML-% IV SOLN
1000.0000 mg | INTRAVENOUS | Status: DC
  Filled 2013-12-13: qty 200

## 2013-12-13 MED ORDER — ONDANSETRON HCL 4 MG PO TABS: 4.0000 mg | ORAL_TABLET | Freq: Four times a day (QID) | ORAL | Status: DC | PRN

## 2013-12-13 MED ORDER — ENOXAPARIN SODIUM 40 MG/0.4ML ~~LOC~~ SOLN
40.0000 mg | SUBCUTANEOUS | Status: DC
Start: 2013-12-14 — End: 2013-12-15
  Administered 2013-12-14 – 2013-12-15 (×2): 40 mg via SUBCUTANEOUS
  Filled 2013-12-13 (×2): qty 0.4

## 2013-12-13 MED ORDER — TAMSULOSIN HCL 0.4 MG PO CAPS
0.8000 mg | ORAL_CAPSULE | Freq: Every day | ORAL | Status: DC
  Administered 2013-12-14 – 2013-12-15 (×2): 0.8 mg via ORAL
  Filled 2013-12-13 (×2): qty 2

## 2013-12-13 MED ORDER — SODIUM CHLORIDE 0.9 % IV SOLN: INTRAVENOUS | Status: DC

## 2013-12-13 NOTE — ED Notes (Signed)
Pt. [facility] for L foot redness and edema with noted possible infection.  Pt. Reports it has gotten worse since Sunday.  No Pain per Pt. But the skin infection and redness is going up the L leg.

## 2013-12-13 NOTE — Progress Notes (Signed)
ANTIBIOTIC CONSULT NOTE - INITIAL  Pharmacy Consult for Vancomycin  Indication: Cellulitis  Allergies  Allergen Reactions  . Cardura [Doxazosin Mesylate]     Hypotenstion  . Gabapentin     Weight gain    Patient Measurements: Weight: 222 lb (100.699 kg)  Vital Signs: Temp: 97.7 F (36.5 C) (07/21 2223) Temp src: Oral (07/21 2223) BP: 142/78 mmHg (07/21 2223) Pulse Rate: 79 (07/21 2223)  Labs:  Recent Labs  12/13/13 1635  WBC 7.7  HGB 13.8  PLT 236  CREATININE 1.80*   Medical History: Past Medical History  Diagnosis Date  . Aortic stenosis, mild   . Hypertension   . Dyslipidemia   . Myocardial infarction   . Heart murmur   . Chronic kidney disease     nocturia  . Arthritis   . Neuromuscular disorder     neuropathy  . Carotid artery stenosis     bilateral,mild  . Coronary artery disease     CABG 2005, repeat cath with severe 2 vessel ASCAD with high grade stenosis of LAD, patent LIMA to LAD, patent SVG too diag, mildly atretic but widely patent SVG to RCA and high grade stenosis 90% ostial left circ with left dominant with PDA coming off of the left circ s/p rotational atherectomy and cutting balloon PCI with stent to prox left circ    Medications: Failed Bactrim/Keflex PTA  Assessment: 80 y/o to start vancomycin for cellulitis. WBC wnl, Scr 1.80, other labs as above.   Goal of Therapy:  Vancomycin trough level 10-15 mcg/ml  Plan:  -Vancomycin 1000 mg IV q24h -Trend WBC, temp, renal function  -Drug levels as indicated   Abran DukeLedford, Adyan Palau 12/13/2013,11:31 PM

## 2013-12-13 NOTE — Telephone Encounter (Signed)
Received request from Nurse fax box, documents faxed for surgical clearance. [facility] Fax number: [phone] Attention: 7.21.15/km

## 2013-12-13 NOTE — ED Notes (Signed)
MD at bedside. 

## 2013-12-13 NOTE — ED Notes (Signed)
repaged Hospitalist via [employer]

## 2013-12-13 NOTE — H&P (Signed)
[facility] History and Physical  JAMIEON LANNEN VWU:[id number] DOB: September 12, 1928 DOA: 12/13/2013  Referring physician: Patient was transferred from [facility]. PCP: Miguel Aschoff, MD   Chief Complaint: Left leg erythema and swelling.  HPI: Jesus [name] is a 80 y.o. male with history of CAD status post CABG and stenting and aortic stenosis has been experiencing left leg swelling and erythema over the last one week. Patient has had gone to his foot he had Podiatrist few weeks ago and was prescribed ointment for his leg for neuropathy pain. Patient has been applying on his left leg the ointment which contains a combination of amantadine, sertraline and other products. After a week he noticed some skin erythema following which he discontinued the ointment. Says erythema persisted he had come to the ER 4 days ago and was prescribed antibiotics for possible cellulitis and discharged home. Despite taking antibiotics patient's erythema were sent and started spreading proximally. Patient also had oozing of the skin. Denies any trauma or fever chills or use of bites. In the ER patient was started on IV antibiotics and admitted for further management. Patient otherwise denies any chest pain or shortness of breath.   Review of Systems: As presented in the history of presenting illness, rest negative.  Past Medical History  Diagnosis Date  . Aortic stenosis, mild   . Hypertension   . Dyslipidemia   . Myocardial infarction   . Heart murmur   . Chronic kidney disease     nocturia  . Arthritis   . Neuromuscular disorder     neuropathy  . Carotid artery stenosis     bilateral,mild  . Coronary artery disease     CABG 2005, repeat cath with severe 2 vessel ASCAD with high grade stenosis of LAD, patent LIMA to LAD, patent SVG too diag, mildly atretic but widely patent SVG to RCA and high grade stenosis 90% ostial left circ with left dominant with PDA coming off of the left circ s/p rotational  atherectomy and cutting balloon PCI with stent to prox left circ   Past Surgical History  Procedure Laterality Date  . Coronary artery bypass graft    . Prostate biopsy    . Angiogram  12/18/2011  . Artherectomy    . Cholecystectomy  1972  . Abdominal exploration surgery    . Cataracts    . Cardiac catheterization      Showing Severe 2 vessel ASCAD w high grade stenosis of LAD,patent LIMA to LAD,patent SV to diagonal,mildly atretic but widely patent SVG to RCA and high grade 90% ostial left circumflex dominat system w PDA off left circ s/p rotational atherectomy,cutting blloon PCI w stent to proximal left circ   Social History:  reports that he quit smoking about 34 years ago. He has never used smokeless tobacco. He reports that he drinks about .6 ounces of alcohol per week. He reports that he does not use illicit drugs. Where does patient live home. Can patient participate in ADLs? Yes.  Allergies  Allergen Reactions  . Cardura [Doxazosin Mesylate]     Hypotenstion  . Gabapentin     Weight gain    Family History:  Family History  Problem Relation Age of Onset  . Heart attack Father   . Arrhythmia Mother       Prior to Admission medications   Medication Sig Start Date End Date Taking? Authorizing Provider  aspirin EC 81 MG tablet Take 1 tablet (81 mg total) by mouth daily. 07/13/13  Quintella Reichertraci R Turner, MD  atorvastatin (LIPITOR) 10 MG tablet  09/24/13   Historical Provider, MD  cephALEXin (KEFLEX) 500 MG capsule 2 caps po bid x 7 days 12/11/13   Hurman HornJohn M Bednar, MD  lamoTRIgine (LAMICTAL) 200 MG tablet  11/02/13   Historical Provider, MD  Multiple Vitamins-Minerals (CENTRUM SILVER PO) Take 1 tablet by mouth daily.    Historical Provider, MD  pantoprazole (PROTONIX) 40 MG tablet  08/26/13   Historical Provider, MD  sulfamethoxazole-trimethoprim (BACTRIM DS) 800-160 MG per tablet Take 1 tablet by mouth 2 (two) times daily. 12/11/13   Hurman HornJohn M Bednar, MD  tamsulosin (FLOMAX) 0.4 MG CAPS Take  0.8 mg by mouth daily.    Historical Provider, MD    Physical Exam: Filed Vitals:   12/13/13 1537 12/13/13 2122 12/13/13 2223  BP: 140/77 161/88 142/78  Pulse: 84 73 79  Temp: 98.3 F (36.8 C)  97.7 F (36.5 C)  TempSrc: Oral  Oral  Resp: 18 20 18   Weight: 100.699 kg (222 lb)    SpO2: 96% 94% 96%     General:  Well-developed well-nourished.  Eyes: Anicteric no pallor.  ENT: No discharge from ears eyes nose mouth.  Neck: No mass felt.  Cardiovascular: S1-S2 heard. Systolic murmur.  Respiratory: No rhonchi or crepitations.  Abdomen: Soft nontender bowel sounds present. No guarding rigidity.  Skin: Erythematous skin lesion on the anterior aspect of his left foot and shin. Mild oozing of the skin of his left lower extremity. Mild edema of the left leg.  Musculoskeletal: See skin description.  Psychiatric: Appears normal.  Neurologic: Alert and oriented to time place and person. Moves all extremities.  Labs on Admission:  Basic Metabolic Panel:  Recent Labs Lab 12/13/13 1635  NA 142  K 4.7  CL 103  CO2 26  GLUCOSE 114*  BUN 23  CREATININE 1.80*  CALCIUM 9.1   Liver Function Tests: No results found for this basename: AST, ALT, ALKPHOS, BILITOT, PROT, ALBUMIN,  in the last 168 hours No results found for this basename: LIPASE, AMYLASE,  in the last 168 hours No results found for this basename: AMMONIA,  in the last 168 hours CBC:  Recent Labs Lab 12/13/13 1635  WBC 7.7  NEUTROABS 5.1  HGB 13.8  HCT 41.2  MCV 96.9  PLT 236   Cardiac Enzymes: No results found for this basename: CKTOTAL, CKMB, CKMBINDEX, TROPONINI,  in the last 168 hours  BNP (last 3 results) No results found for this basename: PROBNP,  in the last 8760 hours CBG: No results found for this basename: GLUCAP,  in the last 168 hours  Radiological Exams on Admission: No results found.   Assessment/Plan Principal Problem:   Cellulitis of foot, left Active Problems:   Coronary  artery disease   Aortic stenosis   Cellulitis   1. Left leg cellulitis versus contact dermatitis - I have advised patient to stop using the cream for now. Keep left leg elevated. Continue vancomycin IV. I have requested wound team consult. Since patient has edema check Dopplers to rule out DVT. 2. Renal failure - not sure if patient has a chronic component. Check urine studies and closely follow intake output and metabolic panel. Patient was easily on Bactrim which could have further increased patient's creatinine. 3. CAD status post stenting and CABG - denies any chest pain. Continue aspirin. 4. Aortic stenosis - presently asymptomatic. 5. History of BPH - continue present medications. 6. History of hyperlipidemia - patient states he has  not been taking statins.    Code Status: Full code.  Family Communication: None.  Disposition Plan: Admit to inpatient.    Vega Stare N. [facility] Pager [phone].  If 7PM-7AM, please contact night-coverage [email or web address] Password [facility] 12/13/2013, 11:30 PM

## 2013-12-13 NOTE — ED Provider Notes (Signed)
CSN: [id number]     Arrival date & time 12/13/13  1526 History   First MD Initiated Contact with Patient 12/13/13 1534     Chief Complaint  Patient presents with  . Wound Infection     (Consider location/radiation/quality/duration/timing/severity/associated sxs/prior Treatment) HPI Jesus [name] is a 80 y.o. male that presents to the ED for wound recheck. Patient seen two days ago in ED for cellulitis and started on Bactrim DS and Keflex. Patient has history of left foot neuropathy, hypertension and CAD. Redness and swelling have worsened. He has been taking his antibiotics. No fever, nausea, vomiting. There is no pain associated with the infection. Previously negative for DVT two days ago.  Past Medical History  Diagnosis Date  . Aortic stenosis, mild   . Hypertension   . Dyslipidemia   . Myocardial infarction   . Heart murmur   . Chronic kidney disease     nocturia  . Arthritis   . Neuromuscular disorder     neuropathy  . Carotid artery stenosis     bilateral,mild  . Coronary artery disease     CABG 2005, repeat cath with severe 2 vessel ASCAD with high grade stenosis of LAD, patent LIMA to LAD, patent SVG too diag, mildly atretic but widely patent SVG to RCA and high grade stenosis 90% ostial left circ with left dominant with PDA coming off of the left circ s/p rotational atherectomy and cutting balloon PCI with stent to prox left circ   Past Surgical History  Procedure Laterality Date  . Coronary artery bypass graft    . Prostate biopsy    . Angiogram  12/18/2011  . Artherectomy    . Cholecystectomy  1972  . Abdominal exploration surgery    . Cataracts    . Cardiac catheterization      Showing Severe 2 vessel ASCAD w high grade stenosis of LAD,patent LIMA to LAD,patent SV to diagonal,mildly atretic but widely patent SVG to RCA and high grade 90% ostial left circumflex dominat system w PDA off left circ s/p rotational atherectomy,cutting blloon PCI w stent to proximal left  circ   Family History  Problem Relation Age of Onset  . Heart attack Father   . Arrhythmia Mother    History  Substance Use Topics  . Smoking status: Former Smoker    Quit date: 06/20/1979  . Smokeless tobacco: Never Used  . Alcohol Use: 0.6 oz/week    1 Shots of liquor per week     Comment: daily    Review of Systems  Constitutional: Negative for fever and chills.  Gastrointestinal: Negative for nausea and vomiting.  Skin: Positive for rash.  Neurological: Negative for numbness.  Psychiatric/Behavioral: Negative for confusion.      Allergies  Cardura and Gabapentin  Home Medications   Prior to Admission medications   Medication Sig Start Date End Date Taking? Authorizing Provider  aspirin EC 81 MG tablet Take 1 tablet (81 mg total) by mouth daily. 07/13/13   Quintella Reichert, MD  atorvastatin (LIPITOR) 10 MG tablet  09/24/13   Historical Provider, MD  cephALEXin (KEFLEX) 500 MG capsule 2 caps po bid x 7 days 12/11/13   Hurman Horn, MD  lamoTRIgine (LAMICTAL) 200 MG tablet  11/02/13   Historical Provider, MD  Multiple Vitamins-Minerals (CENTRUM SILVER PO) Take 1 tablet by mouth daily.    Historical Provider, MD  pantoprazole (PROTONIX) 40 MG tablet  08/26/13   Historical Provider, MD  sulfamethoxazole-trimethoprim (BACTRIM DS) 800-160  MG per tablet Take 1 tablet by mouth 2 (two) times daily. 12/11/13   Hurman HornJohn M Bednar, MD  tamsulosin (FLOMAX) 0.4 MG CAPS Take 0.8 mg by mouth daily.    Historical Provider, MD   BP 140/77  Pulse 84  Temp(Src) 98.3 F (36.8 C) (Oral)  Resp 18  Wt 222 lb (100.699 kg)  SpO2 96%  Physical Exam  Constitutional: He is oriented to person, place, and time. He appears well-developed and well-nourished. No distress.  Eyes: Conjunctivae and EOM are normal. Pupils are equal, round, and reactive to light.  Cardiovascular: Normal rate and regular rhythm.   Pulses:      Dorsalis pedis pulses are 2+ on the right side, and 2+ on the left side.    Pulmonary/Chest: Effort normal and breath sounds normal. No respiratory distress. He has no wheezes.  Abdominal: Soft. Bowel sounds are normal. He exhibits no distension. There is no tenderness. There is no rebound.  Musculoskeletal:       Left foot: He exhibits normal range of motion and no tenderness.  Neurological: He is alert and oriented to person, place, and time.  Skin:  Left foot has area of erythema and maceration that extends up to lower leg. Weeping of clear fluid between digits of foot.          ED Course  Procedures (including critical care time) Labs Review Labs Reviewed  CBC WITH DIFFERENTIAL - Abnormal; Notable for the following:    Eosinophils Relative 7 (*)    All other components within normal limits  BASIC METABOLIC PANEL - Abnormal; Notable for the following:    Glucose, Bld 114 (*)    Creatinine, Ser 1.80 (*)    GFR calc non Af Amer 33 (*)    GFR calc Af Amer 38 (*)    All other components within normal limits  COMPREHENSIVE METABOLIC PANEL - Abnormal; Notable for the following:    Creatinine, Ser 1.66 (*)    Albumin 3.3 (*)    Total Bilirubin 0.2 (*)    GFR calc non Af Amer 37 (*)    GFR calc Af Amer 42 (*)    All other components within normal limits  CBC WITH DIFFERENTIAL - Abnormal; Notable for the following:    RBC 3.97 (*)    Hemoglobin 12.8 (*)    Eosinophils Relative 9 (*)    All other components within normal limits  CBC - Abnormal; Notable for the following:    RBC 4.03 (*)    HCT 38.8 (*)    All other components within normal limits  CREATININE, SERUM - Abnormal; Notable for the following:    Creatinine, Ser 1.64 (*)    GFR calc non Af Amer 37 (*)    GFR calc Af Amer 43 (*)    All other components within normal limits  CULTURE, BLOOD (ROUTINE X 2)  CULTURE, BLOOD (ROUTINE X 2)  URINALYSIS, ROUTINE W REFLEX MICROSCOPIC  SODIUM, URINE, RANDOM  CREATININE, URINE, RANDOM    Imaging Review No results found.   EKG  Interpretation None      MDM   Final diagnoses:  Cellulitis of foot, left   Patient with left foot and leg cellulitis that is refractory to outpatient treatment. No elevated white count and patient afebrile while in ED. Alert and oriented. Given Vancomycin IV. [facility] for [facility]. Asked to give Zosyn in addition to Vancomycin. Patient reassessed before transfer and  continued to be stable.  Jacquelin Hawking, MD 12/14/13 [phone]

## 2013-12-14 ENCOUNTER — Ambulatory Visit (INDEPENDENT_AMBULATORY_CARE_PROVIDER_SITE_OTHER): Payer: Medicare Other | Admitting: Surgery

## 2013-12-14 ENCOUNTER — Encounter (INDEPENDENT_AMBULATORY_CARE_PROVIDER_SITE_OTHER): Payer: Medicare Other | Admitting: Surgery

## 2013-12-14 ENCOUNTER — Encounter (HOSPITAL_COMMUNITY): Payer: Self-pay | Admitting: General Practice

## 2013-12-14 DIAGNOSIS — I10 Essential (primary) hypertension: Secondary | ICD-10-CM

## 2013-12-14 DIAGNOSIS — M7989 Other specified soft tissue disorders: Secondary | ICD-10-CM

## 2013-12-14 DIAGNOSIS — M79609 Pain in unspecified limb: Secondary | ICD-10-CM

## 2013-12-14 LAB — CREATININE, SERUM
CREATININE: 1.64 mg/dL — AB (ref 0.50–1.35)
GFR calc Af Amer: 43 mL/min — ABNORMAL LOW (ref >90)
GFR calc non Af Amer: 37 mL/min — ABNORMAL LOW (ref >90)

## 2013-12-14 LAB — COMPREHENSIVE METABOLIC PANEL
ALBUMIN: 3.3 g/dL — AB (ref 3.5–5.2)
ALK PHOS: 65 U/L (ref 39–117)
ALT: 15 U/L (ref 0–53)
AST: 23 U/L (ref 0–37)
Anion gap: 13 (ref 5–15)
BILIRUBIN TOTAL: 0.2 mg/dL — AB (ref 0.3–1.2)
BUN: 22 mg/dL (ref 6–23)
CHLORIDE: 105 meq/L (ref 96–112)
CO2: 24 mEq/L (ref 19–32)
Calcium: 8.6 mg/dL (ref 8.4–10.5)
Creatinine, Ser: 1.66 mg/dL — ABNORMAL HIGH (ref 0.50–1.35)
GFR calc Af Amer: 42 mL/min — ABNORMAL LOW (ref >90)
GFR calc non Af Amer: 37 mL/min — ABNORMAL LOW (ref >90)
Glucose, Bld: 93 mg/dL (ref 70–99)
POTASSIUM: 4.5 meq/L (ref 3.7–5.3)
SODIUM: 142 meq/L (ref 137–147)
Total Protein: 6.3 g/dL (ref 6.0–8.3)

## 2013-12-14 LAB — CBC WITH DIFFERENTIAL/PLATELET
BASOS PCT: 0 % (ref 0–1)
Basophils Absolute: 0 10*3/uL (ref 0.0–0.1)
EOS ABS: 0.6 10*3/uL (ref 0.0–0.7)
Eosinophils Relative: 9 % — ABNORMAL HIGH (ref 0–5)
HEMATOCRIT: 39.3 % (ref 39.0–52.0)
HEMOGLOBIN: 12.8 g/dL — AB (ref 13.0–17.0)
Lymphocytes Relative: 21 % (ref 12–46)
Lymphs Abs: 1.3 10*3/uL (ref 0.7–4.0)
MCH: 32.2 pg (ref 26.0–34.0)
MCHC: 32.6 g/dL (ref 30.0–36.0)
MCV: 99 fL (ref 78.0–100.0)
MONO ABS: 0.7 10*3/uL (ref 0.1–1.0)
Monocytes Relative: 11 % (ref 3–12)
Neutro Abs: 3.7 10*3/uL (ref 1.7–7.7)
Neutrophils Relative %: 59 % (ref 43–77)
Platelets: 208 10*3/uL (ref 150–400)
RBC: 3.97 MIL/uL — ABNORMAL LOW (ref 4.22–5.81)
RDW: 13.1 % (ref 11.5–15.5)
WBC: 6.2 10*3/uL (ref 4.0–10.5)

## 2013-12-14 LAB — CREATININE, URINE, RANDOM: CREATININE, URINE: 68.39 mg/dL

## 2013-12-14 LAB — URINALYSIS, ROUTINE W REFLEX MICROSCOPIC
Bilirubin Urine: NEGATIVE
Glucose, UA: NEGATIVE mg/dL
HGB URINE DIPSTICK: NEGATIVE
Ketones, ur: NEGATIVE mg/dL
Leukocytes, UA: NEGATIVE
NITRITE: NEGATIVE
Protein, ur: NEGATIVE mg/dL
SPECIFIC GRAVITY, URINE: 1.014 (ref 1.005–1.030)
Urobilinogen, UA: 0.2 mg/dL (ref 0.0–1.0)
pH: 6.5 (ref 5.0–8.0)

## 2013-12-14 LAB — SODIUM, URINE, RANDOM: SODIUM UR: 84 meq/L

## 2013-12-14 LAB — CBC
HCT: 38.8 % — ABNORMAL LOW (ref 39.0–52.0)
Hemoglobin: 13.1 g/dL (ref 13.0–17.0)
MCH: 32.5 pg (ref 26.0–34.0)
MCHC: 33.8 g/dL (ref 30.0–36.0)
MCV: 96.3 fL (ref 78.0–100.0)
PLATELETS: 231 10*3/uL (ref 150–400)
RBC: 4.03 MIL/uL — ABNORMAL LOW (ref 4.22–5.81)
RDW: 13 % (ref 11.5–15.5)
WBC: 7.4 10*3/uL (ref 4.0–10.5)

## 2013-12-14 MED ORDER — OXYCODONE HCL 5 MG PO TABS
5.0000 mg | ORAL_TABLET | Freq: Four times a day (QID) | ORAL | Status: DC | PRN
  Administered 2013-12-14: 5 mg via ORAL
  Filled 2013-12-14: qty 1

## 2013-12-14 MED ORDER — VANCOMYCIN HCL 10 G IV SOLR
1500.0000 mg | INTRAVENOUS | Status: DC
  Administered 2013-12-14 – 2013-12-15 (×2): 1500 mg via INTRAVENOUS
  Filled 2013-12-14 (×2): qty 1500

## 2013-12-14 NOTE — ED Provider Notes (Signed)
I saw and evaluated the patient, reviewed the resident's note and I agree with the findings and plan.   EKG Interpretation None      Here with cellulitis and rash. Seen a few days ago, put on antibiotics, but rash is spreading and worsening. On exan, weeping rash on anterior L shin. Weeping red on dorsum of L foot with superinfection of distal foot, webspace of toes. Will start on Vanc, admitted.  Dagmar HaitWilliam Crysta Gulick, MD 12/14/13 1534

## 2013-12-14 NOTE — Progress Notes (Signed)
Utilization review completed.  

## 2013-12-14 NOTE — Progress Notes (Addendum)
[facility] PROGRESS NOTE  Jesus [name] ZOX:[id number] DOB: [birth year] DOA: 12/13/2013 PCP: Miguel Aschoff, MD  Assessment/Plan: Left leg cellulitis- with associated?contact dermatitis  -pt was advised to stop using the cream for now.  -significant clinical improvement Continue vancomycin IV, pending blood cultures.  -appreciate wound team consult input, continue local wound care  Renal failure - acute vs chronic component. -no significangt change, continue hydration for now, follow and recheck -bactrim possible factor- dc'ed CAD status post stenting and CABG - chest pain free. Continue aspirin.  Aortic stenosis - presently asymptomatic.  History of BPH - continue present medications.  History of hyperlipidemia - patient states he has not been taking statins.   Code Status: full Family Communication: son at bedside Disposition Plan: to home possibly in am   Consultants:  Wound care  Procedures:  none  Antibiotics:  VANC started on 7/21   HPI/Subjective: States L. leg much better, he reports he only has pain intermittently and not much  Objective: Filed Vitals:   12/14/13 0540  BP: 143/70  Pulse: 66  Temp: 98.1 F (36.7 C)  Resp: 16    Intake/Output Summary (Last 24 hours) at 12/14/13 1329 Last data filed at 12/14/13 0900  Gross per 24 hour  Intake    720 ml  Output    700 ml  Net     20 ml   Filed Weights   12/13/13 1537  Weight: 100.699 kg (222 lb)    Exam:  General: alert & oriented x 3 In NAD Cardiovascular: RRR, nl S1 s2 Respiratory: CTAB Abdomen: soft +BS NT/ND, no masses palpable Extremities: L. LEG +erythema, but much decreased compared to area marked last pm. +2pedal edema - dorsum of foot to metatarsal area with large supercial ulceration, with serous drainage, No cyanosis     Data Reviewed: Basic Metabolic Panel:  Recent Labs Lab 12/13/13 1635 12/14/13 12/14/13 0635  NA 142  --  142  K 4.7  --  4.5  CL 103  --  105  CO2 26  --   24  GLUCOSE 114*  --  93  BUN 23  --  22  CREATININE 1.80* 1.64* 1.66*  CALCIUM 9.1  --  8.6   Liver Function Tests:  Recent Labs Lab 12/14/13 0635  AST 23  ALT 15  ALKPHOS 65  BILITOT 0.2*  PROT 6.3  ALBUMIN 3.3*   No results found for this basename: LIPASE, AMYLASE,  in the last 168 hours No results found for this basename: AMMONIA,  in the last 168 hours CBC:  Recent Labs Lab 12/13/13 1635 12/14/13 12/14/13 0635  WBC 7.7 7.4 6.2  NEUTROABS 5.1  --  3.7  HGB 13.8 13.1 12.8*  HCT 41.2 38.8* 39.3  MCV 96.9 96.3 99.0  PLT 236 231 208   Cardiac Enzymes: No results found for this basename: CKTOTAL, CKMB, CKMBINDEX, TROPONINI,  in the last 168 hours BNP (last 3 results) No results found for this basename: PROBNP,  in the last 8760 hours CBG: No results found for this basename: GLUCAP,  in the last 168 hours  Recent Results (from the past 240 hour(s))  CULTURE, BLOOD (ROUTINE X 2)     Status: None   Collection Time    12/13/13  5:00 PM      Result Value Ref Range Status   Specimen Description BLOOD RIGHT ARM   Final   Special Requests BOTTLES DRAWN AEROBIC AND ANAEROBIC 6CC BOTH   Final   Culture  Setup Time     Final   Value: 12/13/2013 18:27     Performed at [facility]   Culture     Final   Value:        BLOOD CULTURE RECEIVED NO GROWTH TO DATE CULTURE WILL BE HELD FOR 5 DAYS BEFORE ISSUING A FINAL NEGATIVE REPORT     Performed at [facility]   Report Status PENDING   Incomplete  CULTURE, BLOOD (ROUTINE X 2)     Status: None   Collection Time    12/13/13  5:12 PM      Result Value Ref Range Status   Specimen Description BLOOD LEFT ARM   Final   Special Requests BOTTLES DRAWN AEROBIC AND ANAEROBIC 10CC BOTH   Final   Culture  Setup Time     Final   Value: 12/13/2013 18:27     Performed at [facility]   Culture     Final   Value:        BLOOD CULTURE RECEIVED NO GROWTH TO DATE CULTURE WILL BE HELD FOR 5 DAYS BEFORE ISSUING A  FINAL NEGATIVE REPORT     Performed at [facility]   Report Status PENDING   Incomplete     Studies: No results found.  Scheduled Meds: . aspirin EC  81 mg Oral Daily  . enoxaparin (LOVENOX) injection  40 mg Subcutaneous Q24H  . pantoprazole  40 mg Oral Daily  . tamsulosin  0.8 mg Oral Daily  . vancomycin  1,500 mg Intravenous Q24H   Continuous Infusions: . sodium chloride      Principal Problem:   Cellulitis of foot, left Active Problems:   Coronary artery disease   Aortic stenosis   Cellulitis    Time spent: 35    Lynsee Wands C  [facility] Pager [phone]. If 7PM-7AM, please contact night-coverage at [email or web address], password [email or web address] 12/14/2013, 1:29 PM  LOS: 1 day

## 2013-12-14 NOTE — Consult Note (Addendum)
WOC wound consult note Reason for Consult: Consulted for left leg and foot cellulitis.  Appearance consistent with contact dermatitis of unknown etiology. Expect skin will begin to peel as affected areas evolve.  Pt has been placed on antibiotic coverage. Wound type: partial thickness Measurement: Affected area 8cm x 7cm left anterior foot and metatarsal area Wound bed: Patchy areas of scattered partial thickness skin loss; multiple red raised vesicles which have ruptured  Anterior calf without open wounds or drainage, affected area approx 10X10cm with red raised vesicles. Previous area of erythremia and edema is beginning to recedeed from area which was previously marked. Toenails have appearance consistent with possible onchyomycosis; yellow, dry and cracked with small fissures in between toe spaces, which are moist. Drainage:small amt yellow serous fluid draining from ruptured areas, no odor Dressing procedure/placement/frequency: Gerarda FractionXerofoam guaze with Kerlix wrap applied to promote moist healing. Gauze wrap in between left toes to wick away mositure. Charlesetta GaribaldiJody Harris, RN, BSN, MSN-Student Please re-consult if further assistance is needed.  Thank-you,  Cammie Mcgeeawn Markeita Alicia MSN, RN, CWOCN, [place], CNS [phone]

## 2013-12-14 NOTE — Progress Notes (Signed)
VASCULAR LAB PRELIMINARY  PRELIMINARY  PRELIMINARY  PRELIMINARY  Bilateral lower extremity venous duplex  completed.    Preliminary report:  Bilateral:  No evidence of DVT, superficial thrombosis, or Baker's Cyst.    Lesleigh Hughson, RVT 12/14/2013, 11:20 AM

## 2013-12-15 LAB — CBC
HCT: 38.4 % — ABNORMAL LOW (ref 39.0–52.0)
Hemoglobin: 12.6 g/dL — ABNORMAL LOW (ref 13.0–17.0)
MCH: 32.1 pg (ref 26.0–34.0)
MCHC: 32.8 g/dL (ref 30.0–36.0)
MCV: 97.7 fL (ref 78.0–100.0)
PLATELETS: 224 10*3/uL (ref 150–400)
RBC: 3.93 MIL/uL — ABNORMAL LOW (ref 4.22–5.81)
RDW: 13.2 % (ref 11.5–15.5)
WBC: 6.9 10*3/uL (ref 4.0–10.5)

## 2013-12-15 LAB — BASIC METABOLIC PANEL
ANION GAP: 11 (ref 5–15)
BUN: 21 mg/dL (ref 6–23)
CHLORIDE: 107 meq/L (ref 96–112)
CO2: 22 mEq/L (ref 19–32)
Calcium: 8.5 mg/dL (ref 8.4–10.5)
Creatinine, Ser: 1.49 mg/dL — ABNORMAL HIGH (ref 0.50–1.35)
GFR calc non Af Amer: 42 mL/min — ABNORMAL LOW (ref >90)
GFR, EST AFRICAN AMERICAN: 48 mL/min — AB (ref >90)
Glucose, Bld: 89 mg/dL (ref 70–99)
Potassium: 5.4 mEq/L — ABNORMAL HIGH (ref 3.7–5.3)
Sodium: 140 mEq/L (ref 137–147)

## 2013-12-15 LAB — POTASSIUM: Potassium: 4.6 mEq/L (ref 3.7–5.3)

## 2013-12-15 MED ORDER — OXYCODONE HCL 5 MG PO TABS
5.0000 mg | ORAL_TABLET | Freq: Four times a day (QID) | ORAL | Status: DC | PRN
Start: 2013-12-15 — End: 2014-06-23

## 2013-12-15 MED ORDER — DOXYCYCLINE HYCLATE 100 MG PO CAPS: 100.0000 mg | ORAL_CAPSULE | Freq: Two times a day (BID) | ORAL | Status: DC

## 2013-12-15 MED ORDER — TAMSULOSIN HCL 0.4 MG PO CAPS: 0.8000 mg | ORAL_CAPSULE | Freq: Every day | ORAL | Status: DC

## 2013-12-15 NOTE — Progress Notes (Signed)
Pt refuses IV hydration therapy. Pt educated regarding his renal function at this time. Pt still refusing IV fluids. Nursing will continue to monitor

## 2013-12-15 NOTE — Progress Notes (Signed)
Patient provided with discharge instructions and follow up information. Last dose of IV antibiotic administered. Education provided on dressing change daily and teach back done by patient. He is going home at this time with PO Doxycycline.

## 2013-12-15 NOTE — Progress Notes (Signed)
Pt non- compliant w/ MD orders of "up with assistance". Refuses to call when pt needs to use the restroom despite bed alarm going off. Pt adamant about not having to use a bed alarm, and demands it be turned off entirely. After education and continued discussions regarding safety, pts bed alarm is turn off. Will continue to monitor.

## 2013-12-15 NOTE — Discharge Summary (Signed)
Physician Discharge Summary  Jesus [name] JYN:[id number] DOB: June 01, 1928 DOA: 12/13/2013  PCP: Miguel Aschoff, MD  Admit date: 12/13/2013 Discharge date: 12/15/2013  Time spent: <30 minutes  Recommendations for Outpatient Follow-up:  Follow-up Information   Follow up with [facility], [place]. (in 1-2weeks, call for appt upon disharge)    Specialty:  Obstetrics and Gynecology   Contact information:   [address] [place] [address] [phone]      pending labs -Blood cultures-follow up the results with PCP.   Discharge Diagnoses:  Principal Problem:   Cellulitis of foot, left Active Problems:   Coronary artery disease   Aortic stenosis   Cellulitis   Discharge Condition: Improved/stable  Diet recommendation: Low sodium heart healthy  Filed Weights   12/13/13 1537  Weight: 100.699 kg (222 lb)    History of present illness:  Jesus [name] is a 80 y.o. male with history of CAD status post CABG and stenting and aortic stenosis has been experiencing left leg swelling and erythema over the last one week. Patient has had gone to his foot he had Podiatrist few weeks ago and was prescribed ointment for his leg for neuropathy pain. Patient has been applying on his left leg the ointment which contains a combination of amantadine, sertraline and other products. After a week he noticed some skin erythema following which he discontinued the ointment. Says erythema persisted he had come to the ER 4 days ago and was prescribed antibiotics for possible cellulitis and discharged home. Despite taking antibiotics patient's erythema were sent and started spreading proximally. Patient also had oozing of the skin. Denies any trauma or fever chills or use of bites. In the ER patient was started on IV antibiotics and admitted for further management. Patient otherwise denies any chest pain or shortness of breath.    Hospital Course:  Left leg cellulitis- with associated?contact  dermatitis  -As discussed above upon admission blood cultures were obtained and patient was started on empiric antibiotics as vancomycin. The wound care team was consulted and she received local wound care while in the hospital. -He remained afebrile with no leukocytosis. -He improved clinically with resolution of the weeping, as well as significant decrease in the swelling and erythema. -He was instructed to discontinue the topical cream that he had been using-as was concern about a possibly causing a contact dermatitis.  -His blood cultures to date remain pending>> he is to followup with his PCP for final result -He is medically ready for discharge today>> he'll be discharged on doxycycline and continue local wound care as instructed, and  followup outpatient as directed Renal failure - acute vs chronic component.  -no significangt change, it was initially hydrated on admission, patient refused further hydration last p.m. but on recheck today creatinine down to 1.4 -bactrim possible factor- dc'ed  CAD status post stenting and CABG - chest pain free. Continue aspirin.  Aortic stenosis - presently asymptomatic.  History of BPH - continue present medications.  History of hyperlipidemia - patient states he has not been taking statins. Followup with PCP   Procedures:  None  Consultations:  Wound care team  Discharge Exam: Filed Vitals:   12/15/13 0644  BP: 122/69  Pulse: 70  Temp: 98.1 F (36.7 C)  Resp: 16     Discharge Instructions You were cared for by a hospitalist during your hospital stay. If you have any questions about your discharge medications or the care you received while you were in the hospital after  you are discharged, you can call the unit and asked to speak with the hospitalist on call if the hospitalist that took care of you is not available. Once you are discharged, your primary care physician will handle any further medical issues. Please note that NO REFILLS for  any discharge medications will be authorized once you are discharged, as it is imperative that you return to your primary care physician (or establish a relationship with a primary care physician if you do not have one) for your aftercare needs so that they can reassess your need for medications and monitor your lab values.  Discharge Instructions   Diet - low sodium heart healthy    Complete by:  As directed      Increase activity slowly    Complete by:  As directed             Medication List    STOP taking these medications       sulfamethoxazole-trimethoprim 800-160 MG per tablet  Commonly known as:  BACTRIM DS      TAKE these medications       aspirin EC 81 MG tablet  Take 1 tablet (81 mg total) by mouth daily.     CENTRUM SILVER PO  Take 1 tablet by mouth daily.     doxycycline 100 MG capsule  Commonly known as:  VIBRAMYCIN  Take 1 capsule (100 mg total) by mouth 2 (two) times daily.     oxyCODONE 5 MG immediate release tablet  Commonly known as:  Oxy IR/ROXICODONE  Take 1 tablet (5 mg total) by mouth every 6 (six) hours as needed for severe pain (moderate pain).     tamsulosin 0.4 MG Caps capsule  Commonly known as:  FLOMAX  Take 2 capsules (0.8 mg total) by mouth daily.       Allergies  Allergen Reactions  . Cardura [Doxazosin Mesylate]     Hypotenstion  . Gabapentin     Weight gain      The results of significant diagnostics from this hospitalization (including imaging, microbiology, ancillary and laboratory) are listed below for reference.    Significant Diagnostic Studies: Ct Abdomen Pelvis W Contrast  11/18/2013   CLINICAL DATA:  Abdominal hernia.  Abdominal pain.  EXAM: CT ABDOMEN AND PELVIS WITH CONTRAST  TECHNIQUE: Multidetector CT imaging of the abdomen and pelvis was performed using the standard protocol following bolus administration of intravenous contrast.  CONTRAST:  OMNIPAQUE IOHEXOL 300 MG/ML  SOLN  COMPARISON:  04/19/2012  FINDINGS:  Mild right lower lobe scarring and minimal bibasilar atelectasis are noted. There is no pleural effusion. Three-vessel coronary artery calcification is noted.  Multiple small hypodense lesions in both hepatic lobes do not appear significantly changed, measuring up to 1.7 cm in size. The 1.7 cm lesion is consistent with a cyst, with others being too small fully characterize. Small splenic calcification is unchanged. Bilateral renal atrophy is unchanged. The adrenal glands and pancreas are unremarkable.  Broad-based ventral abdominal wall hernia below the umbilicus is again seen and does not appear significantly changed, containing loops of small large bowel without evidence of bowel obstruction. Sigmoid colonic diverticulosis is again seen without evidence of diverticulitis. Appendix is not identified.  Mild enlargement of the prostate is unchanged. Bladder is grossly unremarkable. Moderate to advanced aortoiliac atherosclerotic calcification is again seen. No free fluid or enlarged lymph nodes are identified. There is slight anterolisthesis of L4 on L5. Advanced spondylosis is present in the lower thoracic and lumbar  spine. Lower lumbar facet arthrosis is also noted.  IMPRESSION: 1. Unchanged appearance of broad-based ventral abdominal hernia containing small and large bowel without evidence of obstruction. 2. No acute abnormality identified in the abdomen or pelvis.   Electronically Signed   By: Sebastian AcheAllen  Grady   On: 11/18/2013 13:35    Microbiology: Recent Results (from the past 240 hour(s))  CULTURE, BLOOD (ROUTINE X 2)     Status: None   Collection Time    12/13/13  5:00 PM      Result Value Ref Range Status   Specimen Description BLOOD RIGHT ARM   Final   Special Requests BOTTLES DRAWN AEROBIC AND ANAEROBIC 6CC BOTH   Final   Culture  Setup Time     Final   Value: 12/13/2013 18:27     Performed at [facility]   Culture     Final   Value:        BLOOD CULTURE RECEIVED NO GROWTH TO DATE  CULTURE WILL BE HELD FOR 5 DAYS BEFORE ISSUING A FINAL NEGATIVE REPORT     Performed at [facility]   Report Status PENDING   Incomplete  CULTURE, BLOOD (ROUTINE X 2)     Status: None   Collection Time    12/13/13  5:12 PM      Result Value Ref Range Status   Specimen Description BLOOD LEFT ARM   Final   Special Requests BOTTLES DRAWN AEROBIC AND ANAEROBIC 10CC BOTH   Final   Culture  Setup Time     Final   Value: 12/13/2013 18:27     Performed at [facility]   Culture     Final   Value:        BLOOD CULTURE RECEIVED NO GROWTH TO DATE CULTURE WILL BE HELD FOR 5 DAYS BEFORE ISSUING A FINAL NEGATIVE REPORT     Performed at [facility]   Report Status PENDING   Incomplete     Labs: Basic Metabolic Panel:  Recent Labs Lab 12/13/13 1635 12/14/13 12/14/13 0635 12/15/13 0500 12/15/13 1010  NA 142  --  142 140  --   K 4.7  --  4.5 5.4* 4.6  CL 103  --  105 107  --   CO2 26  --  24 22  --   GLUCOSE 114*  --  93 89  --   BUN 23  --  22 21  --   CREATININE 1.80* 1.64* 1.66* 1.49*  --   CALCIUM 9.1  --  8.6 8.5  --    Liver Function Tests:  Recent Labs Lab 12/14/13 0635  AST 23  ALT 15  ALKPHOS 65  BILITOT 0.2*  PROT 6.3  ALBUMIN 3.3*   No results found for this basename: LIPASE, AMYLASE,  in the last 168 hours No results found for this basename: AMMONIA,  in the last 168 hours CBC:  Recent Labs Lab 12/13/13 1635 12/14/13 12/14/13 0635 12/15/13 0500  WBC 7.7 7.4 6.2 6.9  NEUTROABS 5.1  --  3.7  --   HGB 13.8 13.1 12.8* 12.6*  HCT 41.2 38.8* 39.3 38.4*  MCV 96.9 96.3 99.0 97.7  PLT 236 231 208 224   Cardiac Enzymes: No results found for this basename: CKTOTAL, CKMB, CKMBINDEX, TROPONINI,  in the last 168 hours BNP: BNP (last 3 results) No results found for this basename: PROBNP,  in the last 8760 hours CBG: No results found for this basename: GLUCAP,  in the last 168 hours     Signed:  Kela Millin  [facility] 12/15/2013, 4:09 PM

## 2013-12-19 LAB — CULTURE, BLOOD (ROUTINE X 2)
CULTURE: NO GROWTH
Culture: NO GROWTH

## 2013-12-21 ENCOUNTER — Encounter: Payer: Self-pay | Admitting: Cardiology

## 2014-01-12 ENCOUNTER — Ambulatory Visit (INDEPENDENT_AMBULATORY_CARE_PROVIDER_SITE_OTHER): Payer: Medicare Other | Admitting: Surgery

## 2014-01-12 VITALS — BP 130/64 | HR 82 | Temp 98.7°F | Ht 69.0 in | Wt 232.4 lb

## 2014-01-12 DIAGNOSIS — K432 Incisional hernia without obstruction or gangrene: Secondary | ICD-10-CM

## 2014-01-12 NOTE — Progress Notes (Signed)
Re:   Jesus [name] DOB:   05/13/1929 MRN:   [id number]  ASSESSMENT AND PLAN: 1.  Ventral incision hernia - 8 x 10 cm on exam  I discussed the indications and complications of hernia surgery with the patient.  I discussed both the laparoscopic and open approach to hernia repair..  The potential risks of hernia surgery include, but are not limited to, bleeding, infection, open surgery, nerve injury, and recurrence of the hernia.  I provided the patient literature about hernia surgery.  Plan: 1) Get foot infection to heal, 2) then plan abdominal wall hernia surgery, 3) he will see me back in 8 weeks.  2.  HTN 3.  CAD  CABG - 2005  Stent placed about 2010.  08/01/2013 - Nuc myocardial perfusion - Moderate inferolateral and anterolateral defect consistent with scar and soft tissue attnenuation with minimal periinfarct ischemia. LV Ejection Fraction: 47%. LV Wall Motion: Lateral hypokinesis.   Dr. Armanda Magic sent a note 12/12/2013 that said he is low risk for surgery. 4.  Neuropathy  Both feet, etiology.  He had left foot infection that required hospitalization - 7/21 - 12/15/2013  He is going to the [facility] in [facility] for his left foot 5.  BPH 6.  GERD, but stopped meds about one year ago. 7.  Heart murmur  Chief Complaint  Patient presents with  . Routine Post Op   REFERRING PHYSICIAN: ROSS,ALLAN, MD  HISTORY OF PRESENT ILLNESS: Jesus [name] is a 80 y.o. (DOB: 06-Dec-1928)  white  male whose primary care physician is [name],ALLAN, MD and comes to me today for an abdominal wall hernia. He is accompanied with his son, Jesus [name].  Jesus [name] is from [place].  He has another son who lives near, but is working. He is not having any symptoms from the abdominal wall hernia. He does wear a binder which helps keep the bulge in. CT scan of abdomen - 11/15/2013 - broad based ventral hernia, unchanged from 04/19/2012.  About 8 to 9 cm on CT scan. The major problem for Mr. Yandow since last saw him he  was hospitalized at [facility] a left foot infection. His son had a picture of the foot when he was in the hospital on his phone.  It is much better. He is going to the wound care specialist in [facility] for further treatment. I answered questions regarding his CT scan. I gave him a copy of the CT scan. Our plan at this time is to get his foot healed and consider hernia surgery in October or November of this year.  History of hernia: Exploratory laparotomy, enterolysis - 10/08/2005 - [facility].  He also had an open cholecystectomy in 1972. He had part of the wound that required a delayed healing.  About 3 years ago, he noticed a bulge in his abdominal wall.  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  The hernia got larger, though he thinks that over the last 6 to 9 months, it has been stable.  It has gotten to the point where it bothers him. He is active that he goes to the gym regularly.  His heart is not bothering him.  He has lost about 15 pounds intentionally.  Past Medical History  Diagnosis Date  . Aortic stenosis, mild   . Dyslipidemia   . Myocardial infarction   . Chronic kidney disease     nocturia  . Neuromuscular disorder     neuropathy  .  Carotid artery stenosis     bilateral,mild  . Coronary artery disease     CABG 2005, repeat cath with severe 2 vessel ASCAD with high grade stenosis of LAD, patent LIMA to LAD, patent SVG too diag, mildly atretic but widely patent SVG to RCA and high grade stenosis 90% ostial left circ with left dominant with PDA coming off of the left circ s/p rotational atherectomy and cutting balloon PCI with stent to prox left circ  . Heart murmur dx'd 1948  . History of blood transfusion     "related to OR"  . GERD (gastroesophageal reflux disease)   . Duodenal ulcer 1949  . Arthritis     "fingers" (12/14/2013)  . Depression 2000    "when my wife died"  . Cellulitis of foot, left 12/13/2013      Past Surgical History  Procedure Laterality  Date  . Coronary artery bypass graft      "CABG X3"  . Prostate biopsy    . Cholecystectomy  1972  . Abdominal exploration surgery    . Cataract extraction w/ intraocular lens  implant, bilateral Bilateral   . Appendectomy    . Coronary angioplasty with stent placement  12/18/2011    "1"  . Tonsillectomy        Current Outpatient Prescriptions  Medication Sig Dispense Refill  . alfuzosin (UROXATRAL) 10 MG 24 hr tablet       . aspirin EC 81 MG tablet Take 1 tablet (81 mg total) by mouth daily.      . B Complex-C (B-COMPLEX WITH VITAMIN C) tablet Take 1 tablet by mouth daily.      . cephALEXin (KEFLEX) 500 MG capsule       . doxycycline (VIBRAMYCIN) 100 MG capsule Take 1 capsule (100 mg total) by mouth 2 (two) times daily.  20 capsule  0  . Multiple Vitamins-Minerals (CENTRUM SILVER PO) Take 1 tablet by mouth daily.      Marland Kitchen. oxyCODONE (OXY IR/ROXICODONE) 5 MG immediate release tablet Take 1 tablet (5 mg total) by mouth every 6 (six) hours as needed for severe pain (moderate pain).  20 tablet  0   No current facility-administered medications for this visit.    Allergies  Allergen Reactions  . Cardura [Doxazosin Mesylate]     Hypotenstion  . Gabapentin     Weight gain    REVIEW OF SYSTEMS: Skin:  No history of rash.  No history of abnormal moles. Infection:  No history of hepatitis or HIV.  No history of MRSA. Neurologic:  Neuropathy of both feet.  Cause unknown.  Uses cream on feet. Cardiac:  HTN.   CAD. CABG - 2005. Stent placed 2010.  Sees Dr. Armanda Magicraci Turner - she sees him once a year.   Pulmonary:  Does not smoke cigarettes.  No asthma or bronchitis.  No OSA/CPAP.  Endocrine:  No diabetes. No thyroid disease. Gastrointestinal:  See HPI. GERD, but stopped meds about 1 year ago.  No history of liver disease.  Open cholecystectomy - 1972. No history of pancreas disease.  No history of colon disease. Urologic:  BPH, followed by Dr. Annabell HowellsWrenn Musculoskeletal:  No history of joint or  back disease. Hematologic:  No bleeding disorder.  No history of anemia.  Not anticoagulated. Psycho-social:  The patient is oriented.   The patient has no obvious psychologic or social impairment to understanding our conversation and plan.  SOCIAL and FAMILY HISTORY: Widower. Lives by self. He is accompanied with his son,  Lise Auer.  Jesus [name] is from [place].  He has another son who lives nearer, but is working.  PHYSICAL EXAM: BP 130/64  Pulse 82  Temp(Src) 98.7 F (37.1 C) (Temporal)  Ht 5\' 9"  (1.753 m)  Wt 232 lb 6 oz (105.405 kg)  BMI 34.30 kg/m2  General: WN slightly obese WM who is alert.  HEENT: Normal. Pupils equal. Neck: Supple. No mass.  No thyroid mass. Lymph Nodes:  No supraclavicular or cervical nodes. Lungs: Clear to auscultation and symmetric breath sounds. Heart:  RRR. Has 3/6 systolic murmur. Abdomen: Soft. Has lower midline abdominal wall hernia.  The edges feel about 8 x 10 cm.  The hernia is soft and reducible. Extremities:  Good strength and ROM  in upper and lower extremities.  Both feet examined.  He still has redness of the left foot.  But he has no open wound. Neurologic:  Grossly intact to motor and sensory function.  DATA REVIEWED: Notes from Dr. Tenny Craw.  Epic notes.  Ovidio Kin, MD,  [facility] [address] [place]  [address]   [place], [place]    [address] Phone:  [phone] FAX:  [phone]

## 2014-01-26 ENCOUNTER — Emergency Department (HOSPITAL_BASED_OUTPATIENT_CLINIC_OR_DEPARTMENT_OTHER)
Admission: EM | Admit: 2014-01-26 | Discharge: 2014-01-26 | Discharge status: Against Medical Advice | Payer: Medicare Other | Attending: Emergency Medicine | Admitting: Emergency Medicine

## 2014-01-26 ENCOUNTER — Encounter (HOSPITAL_BASED_OUTPATIENT_CLINIC_OR_DEPARTMENT_OTHER): Payer: Self-pay | Admitting: Emergency Medicine

## 2014-01-26 DIAGNOSIS — S99919A Unspecified injury of unspecified ankle, initial encounter: Secondary | ICD-10-CM | POA: Diagnosis not present

## 2014-01-26 DIAGNOSIS — Z951 Presence of aortocoronary bypass graft: Secondary | ICD-10-CM | POA: Insufficient documentation

## 2014-01-26 DIAGNOSIS — N189 Chronic kidney disease, unspecified: Secondary | ICD-10-CM | POA: Diagnosis not present

## 2014-01-26 DIAGNOSIS — Y929 Unspecified place or not applicable: Secondary | ICD-10-CM | POA: Diagnosis not present

## 2014-01-26 DIAGNOSIS — Y939 Activity, unspecified: Secondary | ICD-10-CM | POA: Insufficient documentation

## 2014-01-26 DIAGNOSIS — G609 Hereditary and idiopathic neuropathy, unspecified: Secondary | ICD-10-CM | POA: Insufficient documentation

## 2014-01-26 DIAGNOSIS — I2510 Atherosclerotic heart disease of native coronary artery without angina pectoris: Secondary | ICD-10-CM | POA: Insufficient documentation

## 2014-01-26 DIAGNOSIS — Z87891 Personal history of nicotine dependence: Secondary | ICD-10-CM | POA: Insufficient documentation

## 2014-01-26 DIAGNOSIS — X58XXXA Exposure to other specified factors, initial encounter: Secondary | ICD-10-CM | POA: Insufficient documentation

## 2014-01-26 DIAGNOSIS — I252 Old myocardial infarction: Secondary | ICD-10-CM | POA: Diagnosis not present

## 2014-01-26 DIAGNOSIS — S8990XA Unspecified injury of unspecified lower leg, initial encounter: Secondary | ICD-10-CM | POA: Diagnosis not present

## 2014-01-26 DIAGNOSIS — S99929A Unspecified injury of unspecified foot, initial encounter: Principal | ICD-10-CM

## 2014-01-26 DIAGNOSIS — R011 Cardiac murmur, unspecified: Secondary | ICD-10-CM | POA: Insufficient documentation

## 2014-01-26 NOTE — ED Notes (Signed)
Pt observed walking out of triage room, leaving the ER, getting in his car and driving away.

## 2014-01-26 NOTE — ED Notes (Signed)
patient states that he has peripheral neroapthy and the patient has pain to his right toe and foot. Being treated for cellulitis.

## 2014-02-01 ENCOUNTER — Ambulatory Visit: Payer: Medicare Other | Admitting: Podiatry

## 2014-02-17 ENCOUNTER — Encounter (INDEPENDENT_AMBULATORY_CARE_PROVIDER_SITE_OTHER): Payer: Medicare Other | Admitting: Surgery

## 2014-05-04 ENCOUNTER — Encounter (HOSPITAL_COMMUNITY): Payer: Self-pay | Admitting: Interventional Cardiology

## 2014-05-30 ENCOUNTER — Telehealth: Payer: Self-pay | Admitting: Cardiology

## 2014-05-30 NOTE — Telephone Encounter (Signed)
New Message   Amy of Dr. [facility] office [facility] called to let rn know that the cardiac clearance has not yet been received and surgery is tomorrow 1/6. Their fax number is [phone].

## 2014-05-30 NOTE — Telephone Encounter (Signed)
New Message °

## 2014-05-30 NOTE — Telephone Encounter (Signed)
Amy st she cannot find a clearance for this patient. Per our records, they were faxed December 12, 2013.  Clearance faxed again.

## 2014-05-30 NOTE — Telephone Encounter (Signed)
error 

## 2014-05-31 NOTE — Progress Notes (Signed)
Please put orders in Epic surgery 06-09-14 pre op 06-05-14 Thanks

## 2014-06-02 NOTE — Patient Instructions (Addendum)
Jesus [name]  06/02/2014   Your procedure is scheduled on: 06/09/14   [facility]  Entr[facility] and follow signs to               Short Stay Center at 7:30  AM.   Call this number if you have problems the morning of surgery [phone]   Remember:  Do not eat food or drink liquids :After Midnight.     Take these medicines the morning of surgery with A SIP OF WATER:                                You may not have any metal on your body including hair pins and              piercings  Do not wear jewelry, make-up, lotions, powders or perfumes.             Do not wear nail polish.  Do not shave  48 hours prior to surgery.              Men may shave face and neck.   Do not bring valuables to the hospital. [facility] IS NOT             RESPONSIBLE   FOR VALUABLES.  Contacts, dentures or bridgework may not be worn into surgery.  Leave suitcase in the car. After surgery it may be brought to your room.     Patients discharged the day of surgery will not be allowed to drive home.  Name and phone number of your driver:  Special Instructions: N/A              Please read over the following fact sheets you were given: _____________________________________________________________________                                                     [place] - PREPARING FOR SURGERY  Before surgery, you can play an important role.  Because skin is not sterile, your skin needs to be as free of germs as possible.  You can reduce the number of germs on your skin by washing with CHG (chlorahexidine gluconate) soap before surgery.  CHG is an antiseptic cleaner which kills germs and bonds with the skin to continue killing germs even after washing. Please DO NOT use if you have an allergy to CHG or antibacterial soaps.  If your skin becomes reddened/irritated stop using the CHG and inform your nurse when you arrive at Short Stay. Do not shave (including legs  and underarms) for at least 48 hours prior to the first CHG shower.  You may shave your face. Please follow these instructions carefully:   1.  Shower with CHG Soap the night before surgery and the  morning of Surgery.   2.  If you choose to wash your hair, wash your hair first as usual with your  normal  Shampoo.   3.  After you shampoo, rinse your hair and body thoroughly to remove the  shampoo.  4.  Use CHG as you would any other liquid soap.  You can apply chg directly  to the skin and wash . Gently wash with scrungie or clean wascloth    5.  Apply the CHG Soap to your body ONLY FROM THE NECK DOWN.   Do not use on open                           Wound or open sores. Avoid contact with eyes, ears mouth and genitals (private parts).                        Genitals (private parts) with your normal soap.              6.  Wash thoroughly, paying special attention to the area where your surgery  will be performed.   7.  Thoroughly rinse your body with warm water from the neck down.   8.  DO NOT shower/wash with your normal soap after using and rinsing off  the CHG Soap .                9.  Pat yourself dry with a clean towel.             10.  Wear clean pajamas.             11.  Place clean sheets on your bed the night of your first shower and do not  sleep with pets.  Day of Surgery : Do not apply any lotions/deodorants the morning of surgery.  Please wear clean clothes to the hospital/surgery center.  FAILURE TO FOLLOW THESE INSTRUCTIONS MAY RESULT IN THE CANCELLATION OF YOUR SURGERY    PATIENT SIGNATURE_________________________________  ______________________________________________________________________

## 2014-06-05 ENCOUNTER — Other Ambulatory Visit (HOSPITAL_COMMUNITY): Payer: Self-pay | Admitting: *Deleted

## 2014-06-05 ENCOUNTER — Inpatient Hospital Stay (HOSPITAL_COMMUNITY)
Admission: RE | Admit: 2014-06-05 | Discharge: 2014-06-05 | Disposition: A | Discharge status: Home / Self Care | Payer: Self-pay | Source: Ambulatory Visit

## 2014-06-07 ENCOUNTER — Encounter (HOSPITAL_COMMUNITY): Payer: Self-pay

## 2014-06-07 ENCOUNTER — Encounter (HOSPITAL_COMMUNITY)
Admission: RE | Admit: 2014-06-07 | Discharge: 2014-06-07 | Disposition: A | Discharge status: Home / Self Care | Payer: Medicare Other | Source: Ambulatory Visit | Attending: Surgery | Admitting: Surgery

## 2014-06-07 ENCOUNTER — Ambulatory Visit (HOSPITAL_COMMUNITY)
Admission: RE | Admit: 2014-06-07 | Discharge: 2014-06-07 | Disposition: A | Discharge status: Home / Self Care | Payer: Medicare Other | Source: Ambulatory Visit | Attending: Anesthesiology | Admitting: Anesthesiology

## 2014-06-07 ENCOUNTER — Other Ambulatory Visit (INDEPENDENT_AMBULATORY_CARE_PROVIDER_SITE_OTHER): Payer: Self-pay | Admitting: Surgery

## 2014-06-07 DIAGNOSIS — K439 Ventral hernia without obstruction or gangrene: Secondary | ICD-10-CM | POA: Diagnosis not present

## 2014-06-07 DIAGNOSIS — I252 Old myocardial infarction: Secondary | ICD-10-CM | POA: Insufficient documentation

## 2014-06-07 DIAGNOSIS — Z951 Presence of aortocoronary bypass graft: Secondary | ICD-10-CM | POA: Insufficient documentation

## 2014-06-07 DIAGNOSIS — I350 Nonrheumatic aortic (valve) stenosis: Secondary | ICD-10-CM | POA: Diagnosis not present

## 2014-06-07 DIAGNOSIS — Z01818 Encounter for other preprocedural examination: Secondary | ICD-10-CM | POA: Insufficient documentation

## 2014-06-07 DIAGNOSIS — I2510 Atherosclerotic heart disease of native coronary artery without angina pectoris: Secondary | ICD-10-CM

## 2014-06-07 HISTORY — DX: Ventral hernia without obstruction or gangrene: K43.9

## 2014-06-07 HISTORY — DX: Nocturia: R35.1

## 2014-06-07 HISTORY — DX: Reserved for inherently not codable concepts without codable children: IMO0001

## 2014-06-07 HISTORY — DX: Benign prostatic hyperplasia without lower urinary tract symptoms: N40.0

## 2014-06-07 LAB — CBC
HEMATOCRIT: 40.6 % (ref 39.0–52.0)
Hemoglobin: 13.2 g/dL (ref 13.0–17.0)
MCH: 32.5 pg (ref 26.0–34.0)
MCHC: 32.5 g/dL (ref 30.0–36.0)
MCV: 100 fL (ref 78.0–100.0)
PLATELETS: 215 10*3/uL (ref 150–400)
RBC: 4.06 MIL/uL — ABNORMAL LOW (ref 4.22–5.81)
RDW: 13.2 % (ref 11.5–15.5)
WBC: 7.3 10*3/uL (ref 4.0–10.5)

## 2014-06-07 LAB — COMPREHENSIVE METABOLIC PANEL
ALT: 18 U/L (ref 0–53)
AST: 23 U/L (ref 0–37)
Albumin: 4.2 g/dL (ref 3.5–5.2)
Alkaline Phosphatase: 55 U/L (ref 39–117)
Anion gap: 5 (ref 5–15)
BUN: 26 mg/dL — ABNORMAL HIGH (ref 6–23)
CO2: 28 mmol/L (ref 19–32)
Calcium: 9.5 mg/dL (ref 8.4–10.5)
Chloride: 109 mEq/L (ref 96–112)
Creatinine, Ser: 1.33 mg/dL (ref 0.50–1.35)
GFR calc non Af Amer: 48 mL/min — ABNORMAL LOW (ref >90)
GFR, EST AFRICAN AMERICAN: 55 mL/min — AB (ref >90)
Glucose, Bld: 90 mg/dL (ref 70–99)
Potassium: 5.4 mmol/L — ABNORMAL HIGH (ref 3.5–5.1)
SODIUM: 142 mmol/L (ref 135–145)
Total Bilirubin: 0.7 mg/dL (ref 0.3–1.2)
Total Protein: 6.7 g/dL (ref 6.0–8.3)

## 2014-06-07 NOTE — Patient Instructions (Signed)
Otho Perlorman F Dorado  06/07/2014   Your procedure is scheduled on: 06/09/14   Report to [facility] Main  Entrance and follow signs to               Short Stay Center at 7:30  AM.   Call this number if you have problems the morning of surgery [phone]   Remember:  Do not eat food or drink liquids :After Midnight.     Take these medicines the morning of surgery with A SIP OF WATER: ALFUZOSIN / PANTOPRAZOLE             STOP ASPIRIN AND VITAMINS 5-7 DAYS BEFORE SURGERY                               You may not have any metal on your body including hair pins and              piercings  Do not wear jewelry, make-up, lotions, powders or perfumes.             Do not wear nail polish.  Do not shave  48 hours prior to surgery.              Men may shave face and neck.   Do not bring valuables to the hospital. [facility] IS NOT             RESPONSIBLE   FOR VALUABLES.  Contacts, dentures or bridgework may not be worn into surgery.  Leave suitcase in the car. After surgery it may be brought to your room.     Patients discharged the day of surgery will not be allowed to drive home.  Name and phone number of your driver:  Special Instructions: N/A              Please read over the following fact sheets you were given: _____________________________________________________________________                                                     [facility] - PREPARING FOR SURGERY  Before surgery, you can play an important role.  Because skin is not sterile, your skin needs to be as free of germs as possible.  You can reduce the number of germs on your skin by washing with CHG (chlorahexidine gluconate) soap before surgery.  CHG is an antiseptic cleaner which kills germs and bonds with the skin to continue killing germs even after washing. Please DO NOT use if you have an allergy to CHG or antibacterial soaps.  If your skin becomes reddened/irritated stop using the CHG and  inform your nurse when you arrive at Short Stay. Do not shave (including legs and underarms) for at least 48 hours prior to the first CHG shower.  You may shave your face. Please follow these instructions carefully:   1.  Shower with CHG Soap the night before surgery and the  morning of Surgery.   2.  If you choose to wash your hair, wash your hair first as usual with your  normal  Shampoo.   3.  After you shampoo, rinse your hair and body thoroughly to remove the  shampoo.  4.  Use CHG as you would any other liquid soap.  You can apply chg directly  to the skin and wash . Gently wash with scrungie or clean wascloth    5.  Apply the CHG Soap to your body ONLY FROM THE NECK DOWN.   Do not use on open                           Wound or open sores. Avoid contact with eyes, ears mouth and genitals (private parts).                        Genitals (private parts) with your normal soap.              6.  Wash thoroughly, paying special attention to the area where your surgery  will be performed.   7.  Thoroughly rinse your body with warm water from the neck down.   8.  DO NOT shower/wash with your normal soap after using and rinsing off  the CHG Soap .                9.  Pat yourself dry with a clean towel.             10.  Wear clean pajamas.             11.  Place clean sheets on your bed the night of your first shower and do not  sleep with pets.  Day of Surgery : Do not apply any lotions/deodorants the morning of surgery.  Please wear clean clothes to the hospital/surgery center.  FAILURE TO FOLLOW THESE INSTRUCTIONS MAY RESULT IN THE CANCELLATION OF YOUR SURGERY    PATIENT SIGNATURE_________________________________  ______________________________________________________________________

## 2014-06-07 NOTE — Progress Notes (Signed)
   06/07/14 1038  OBSTRUCTIVE SLEEP APNEA  Have you ever been diagnosed with sleep apnea through a sleep study? No  Do you snore loudly (loud enough to be heard through closed doors)?  0  Do you often feel tired, fatigued, or sleepy during the daytime? 1  Has anyone observed you stop breathing during your sleep? 0  Do you have, or are you being treated for high blood pressure? 0  BMI more than 35 kg/m2? 1  Age over 79 years old? 1  Neck circumference greater than 40 cm/16 inches? 1  Gender: 1  Obstructive Sleep Apnea Score 5  Score 4 or greater  Results sent to PCP

## 2014-06-08 NOTE — H&P (Signed)
Jesus [name] 05/31/2014 8:12 AM Location: [facility] Patient #: [id number] DOB: 12/21/1928 Widowed / Language: [place] / Race: White Male  History of Present Illness:  The patient is a 81 year old male who presents with an incisional hernia. His PCP is Dr. Duane LopeAlan Ross. Comes with his son [name].  He has decided to go ahead with surgery. We reviewed the hospital course (3 to 5 days in the hospital), the potential complications - with the hernia surgery and other medical possibilities, and the recovery. The son was worried about his discharge since he lives by himself. I told him if he had trouble he could be managed by his children or go to assisted living/NH. I think they both have a good idea about the recovery and plans for the surgery.  1. Ventral incision hernia - 8 x 10 cm on exam He had a laparototomy with enterolysis of adhesions in 11/11/2005 by Dr. Elveria Rising. Rosenbower. There was some question of a colon lesion at the time, but this proved not to be true. He did not have any bowel resected. CT scan on 11/18/2013 shows the hernia well. I discussed the indications and complications of hernia surgery with the patient. I discussed both the laparoscopic and open approach to hernia repair.. The potential risks of hernia surgery include, but are not limited to, bleeding, infection, open surgery, nerve injury, and recurrence of the hernia. I provided the patient literature about hernia surgery. Plan: 1) He is scheduled for surgery 06/09/2014  2. HTN 3. CAD CABG - 2005 Stent placed about 2010. 08/01/2013 - Nuc myocardial perfusion - Moderate inferolateral and anterolateral defect consistent with scar and soft tissue attnenuation with minimal periinfarct ischemia. LV Ejection Fraction: 47%. LV Wall Motion: Lateral hypokinesis. Dr. Armanda Magicraci Turner sent a note 12/12/2013 that said he is low risk for surgery. 4. Neuropathy Both feet, etiology unclear. The  infection he had in the fall is gone and he is not going to the [facility] wound clinic anymore. He says that he has pain very 10 days and uses an over the counter cream which helps his symptoms. 5. BPH He sees Dr. Annabell HowellsWrenn next week. He voids every 2.5 hours. I told him to keep his appt. I will probably at least leave a foley in him overnight. 6. GERD, but stopped meds about one year ago. 7. Heart murmur  SOCIAL and FAMILY HISTORY: Widower. Lives by self. He is accompanied with his son, Dominic PeaWarren Sforza. [name] from [place]. He has another son, Sharma Covertorman, who came with him last time and who is from [place]. I think I had the names wrong in my last note.  Other Problems: VENTRAL INCISIONAL HERNIA (553.21  K43.2) Arthritis Atrial Fibrillation Cholelithiasis Enlarged Prostate Gastroesophageal Reflux Disease Heart murmur Inguinal Hernia Myocardial infarction  Past Surgical History Kandis Cocking(Nicolle Heward H Emmalin Jaquess, MD; 05/31/2014 8:17 AM) Appendectomy Carotid Artery Surgery Right. Cataract Surgery Bilateral. Coronary Artery Bypass Graft Foot Surgery Right. Gallbladder Surgery - Open Resection of Stomach Tonsillectomy  Diagnostic Studies History Kandis Cocking(Kamill Fulbright H Kees Idrovo, MD; 05/31/2014 8:17 AM) Colonoscopy 5-10 years ago  Allergies (Ammie Eversole, LPN; 4/0/98111/10/2014 9:148:14 AM) Cardura *ANTIHYPERTENSIVES* Gabapentin *ANTICONVULSANTS*  Medication History (Ammie Eversole, LPN; 7/8/29561/10/2014 2:138:16 AM) Aspirin (81MG  Tablet, 1 (one) Oral) Active. Alfuzosin HCl ER (10MG  Tablet ER 24HR, Oral) Active. Centrum Silver Adult 50+ (Oral) Active. Super B Complex (Oral) Active. Multivitamin Adults 50+ (Oral) Active. Pantoprazole Sodium (20MG  Tablet DR, Oral) Active. Triamcinolone Acetonide Active.  Social History Kandis Cocking(Reilynn Lauro H Loriann Bosserman, MD; 05/31/2014 8:17  AM) Alcohol use Occasional alcohol use. No caffeine use No drug use Tobacco use Former smoker.  Family History Kandis Cocking, MD; 05/31/2014  8:17 AM) Family history unknown First Degree Relatives Heart Disease Father, Mother. Heart disease in male family member before age 67 Heart disease in male family member before age 37  Vitals (Ammie Eversole LPN; 05/31/1094 0:45 AM) 05/31/2014 8:13 AM Weight: 241.2 lb Height: 69in Body Surface Area: 2.31 m Body Mass Index: 35.62 kg/m Temp.: 98.92F(Oral)  Pulse: 88 (Regular)  Resp.: 16 (Unlabored)  BP: 152/86 (Sitting, Left Arm, Standard)  Physical Exam: General: WN slightly obese WM who is alert. HEENT: Normal. Pupils equal. Neck: Supple. No mass. No thyroid mass. Lymph Nodes: No supraclavicular or cervical nodes. Lungs: Clear to auscultation and symmetric breath sounds. Heart: RRR. Has 3/6 systolic murmur.  Abdomen: Soft. He has a right subcostal scar. Has lower midline abdominal wall hernia. The edges feel about 8 x 10 cm. The hernia is soft and reducible.  Extremities: Good strength and ROM in upper and lower extremities. He left foot looks okay. It is slightly red. But he has no open wound. His second toe on the left is big.  Assessment & Plan: VENTRAL INCISIONAL HERNIA (553.21  K43.2) Story: He had a laparototomy with enterolysis of adhesions in 11/11/2005 by Dr. Elveria Rising.  He has developed an abdominal wall hernia. I had offered him to see Dr. Abbey Chatters for the surgery in the past. Impression: For laparoscopic repair of abdominal wall hernia on 06/09/2014. I again reviewed the indications and complications of the surgery with the patient and his son.  Ovidio Kin, MD, [facility] Surgery Pager: [phone] Office phone:  [phone]

## 2014-06-09 ENCOUNTER — Inpatient Hospital Stay (HOSPITAL_COMMUNITY): Payer: Medicare Other | Admitting: Certified Registered Nurse Anesthetist

## 2014-06-09 ENCOUNTER — Inpatient Hospital Stay (HOSPITAL_COMMUNITY)
Admission: RE | Admit: 2014-06-09 | Discharge: 2014-06-23 | DRG: 336 | Disposition: A | Discharge status: Home Health | Payer: Medicare Other | Source: Ambulatory Visit | Attending: Surgery | Admitting: Surgery

## 2014-06-09 ENCOUNTER — Encounter (HOSPITAL_COMMUNITY)
Admission: RE | Disposition: A | Discharge status: Home Health | Payer: Self-pay | Source: Ambulatory Visit | Attending: Surgery

## 2014-06-09 ENCOUNTER — Encounter (HOSPITAL_COMMUNITY): Payer: Self-pay | Admitting: *Deleted

## 2014-06-09 DIAGNOSIS — K439 Ventral hernia without obstruction or gangrene: Secondary | ICD-10-CM | POA: Diagnosis present

## 2014-06-09 DIAGNOSIS — G609 Hereditary and idiopathic neuropathy, unspecified: Secondary | ICD-10-CM | POA: Diagnosis present

## 2014-06-09 DIAGNOSIS — Z7982 Long term (current) use of aspirin: Secondary | ICD-10-CM | POA: Diagnosis not present

## 2014-06-09 DIAGNOSIS — K219 Gastro-esophageal reflux disease without esophagitis: Secondary | ICD-10-CM | POA: Diagnosis present

## 2014-06-09 DIAGNOSIS — N390 Urinary tract infection, site not specified: Secondary | ICD-10-CM | POA: Diagnosis not present

## 2014-06-09 DIAGNOSIS — I10 Essential (primary) hypertension: Secondary | ICD-10-CM | POA: Diagnosis present

## 2014-06-09 DIAGNOSIS — F10231 Alcohol dependence with withdrawal delirium: Secondary | ICD-10-CM | POA: Diagnosis not present

## 2014-06-09 DIAGNOSIS — Z66 Do not resuscitate: Secondary | ICD-10-CM | POA: Diagnosis present

## 2014-06-09 DIAGNOSIS — I2510 Atherosclerotic heart disease of native coronary artery without angina pectoris: Secondary | ICD-10-CM | POA: Diagnosis present

## 2014-06-09 DIAGNOSIS — F0151 Vascular dementia with behavioral disturbance: Secondary | ICD-10-CM | POA: Diagnosis not present

## 2014-06-09 DIAGNOSIS — I4729 Other ventricular tachycardia: Secondary | ICD-10-CM

## 2014-06-09 DIAGNOSIS — Z87891 Personal history of nicotine dependence: Secondary | ICD-10-CM

## 2014-06-09 DIAGNOSIS — Z955 Presence of coronary angioplasty implant and graft: Secondary | ICD-10-CM | POA: Diagnosis not present

## 2014-06-09 DIAGNOSIS — K432 Incisional hernia without obstruction or gangrene: Secondary | ICD-10-CM | POA: Diagnosis present

## 2014-06-09 DIAGNOSIS — R011 Cardiac murmur, unspecified: Secondary | ICD-10-CM | POA: Diagnosis present

## 2014-06-09 DIAGNOSIS — I4891 Unspecified atrial fibrillation: Secondary | ICD-10-CM | POA: Diagnosis present

## 2014-06-09 DIAGNOSIS — M1990 Unspecified osteoarthritis, unspecified site: Secondary | ICD-10-CM | POA: Diagnosis present

## 2014-06-09 DIAGNOSIS — E785 Hyperlipidemia, unspecified: Secondary | ICD-10-CM | POA: Diagnosis present

## 2014-06-09 DIAGNOSIS — Z79899 Other long term (current) drug therapy: Secondary | ICD-10-CM | POA: Diagnosis not present

## 2014-06-09 DIAGNOSIS — Z951 Presence of aortocoronary bypass graft: Secondary | ICD-10-CM | POA: Diagnosis not present

## 2014-06-09 DIAGNOSIS — I472 Ventricular tachycardia: Secondary | ICD-10-CM | POA: Diagnosis not present

## 2014-06-09 DIAGNOSIS — N400 Enlarged prostate without lower urinary tract symptoms: Secondary | ICD-10-CM | POA: Diagnosis present

## 2014-06-09 DIAGNOSIS — F05 Delirium due to known physiological condition: Secondary | ICD-10-CM | POA: Diagnosis not present

## 2014-06-09 DIAGNOSIS — F10931 Alcohol use, unspecified with withdrawal delirium: Secondary | ICD-10-CM | POA: Diagnosis not present

## 2014-06-09 DIAGNOSIS — Z515 Encounter for palliative care: Secondary | ICD-10-CM

## 2014-06-09 DIAGNOSIS — K913 Postprocedural intestinal obstruction: Secondary | ICD-10-CM | POA: Diagnosis not present

## 2014-06-09 DIAGNOSIS — I252 Old myocardial infarction: Secondary | ICD-10-CM

## 2014-06-09 DIAGNOSIS — Z9049 Acquired absence of other specified parts of digestive tract: Secondary | ICD-10-CM | POA: Diagnosis present

## 2014-06-09 DIAGNOSIS — Z888 Allergy status to other drugs, medicaments and biological substances status: Secondary | ICD-10-CM | POA: Diagnosis not present

## 2014-06-09 HISTORY — DX: Cellulitis of left toe: L03.032

## 2014-06-09 HISTORY — PX: VENTRAL HERNIA REPAIR: SHX424

## 2014-06-09 HISTORY — DX: Pain in leg, unspecified: M79.606

## 2014-06-09 HISTORY — PX: LAPAROSCOPIC LYSIS OF ADHESIONS: SHX5905

## 2014-06-09 HISTORY — DX: Incisional hernia without obstruction or gangrene: K43.2

## 2014-06-09 SURGERY — REPAIR, HERNIA, VENTRAL, LAPAROSCOPIC
Anesthesia: General | Site: Abdomen

## 2014-06-09 MED ORDER — LACTATED RINGERS IV SOLN
INTRAVENOUS | Status: DC | PRN
  Administered 2014-06-09 (×2): via INTRAVENOUS

## 2014-06-09 MED ORDER — CEFAZOLIN SODIUM-DEXTROSE 2-3 GM-% IV SOLR
INTRAVENOUS | Status: AC
  Filled 2014-06-09: qty 50

## 2014-06-09 MED ORDER — FENTANYL CITRATE 0.05 MG/ML IJ SOLN
INTRAMUSCULAR | Status: DC | PRN
  Administered 2014-06-09: 50 ug via INTRAVENOUS
  Administered 2014-06-09: 100 ug via INTRAVENOUS
  Administered 2014-06-09 (×3): 50 ug via INTRAVENOUS
  Administered 2014-06-09: 100 ug via INTRAVENOUS
  Administered 2014-06-09 (×2): 50 ug via INTRAVENOUS

## 2014-06-09 MED ORDER — EPHEDRINE SULFATE 50 MG/ML IJ SOLN
INTRAMUSCULAR | Status: DC | PRN
  Administered 2014-06-09 (×2): 5 mg via INTRAVENOUS

## 2014-06-09 MED ORDER — GLYCOPYRROLATE 0.2 MG/ML IJ SOLN
INTRAMUSCULAR | Status: DC | PRN
  Administered 2014-06-09: .6 mg via INTRAVENOUS

## 2014-06-09 MED ORDER — PHENOL 1.4 % MT LIQD
1.0000 | OROMUCOSAL | Status: DC | PRN
  Administered 2014-06-09: 1 via OROMUCOSAL
  Filled 2014-06-09 (×2): qty 177

## 2014-06-09 MED ORDER — CHLORHEXIDINE GLUCONATE 4 % EX LIQD: 1.0000 "application " | Freq: Once | CUTANEOUS | Status: DC

## 2014-06-09 MED ORDER — ONDANSETRON HCL 4 MG/2ML IJ SOLN: 4.0000 mg | Freq: Once | INTRAMUSCULAR | Status: DC | PRN

## 2014-06-09 MED ORDER — FENTANYL CITRATE 0.05 MG/ML IJ SOLN
INTRAMUSCULAR | Status: AC
  Filled 2014-06-09: qty 5

## 2014-06-09 MED ORDER — CEFAZOLIN SODIUM-DEXTROSE 2-3 GM-% IV SOLR
2.0000 g | INTRAVENOUS | Status: AC
  Administered 2014-06-09: 2 g via INTRAVENOUS

## 2014-06-09 MED ORDER — LIDOCAINE HCL (CARDIAC) 20 MG/ML IV SOLN
INTRAVENOUS | Status: AC
  Filled 2014-06-09: qty 5

## 2014-06-09 MED ORDER — NEOSTIGMINE METHYLSULFATE 10 MG/10ML IV SOLN
INTRAVENOUS | Status: DC | PRN
  Administered 2014-06-09: 4 mg via INTRAVENOUS

## 2014-06-09 MED ORDER — HYDROMORPHONE HCL 1 MG/ML IJ SOLN
INTRAMUSCULAR | Status: AC
Start: 2014-06-09 — End: 2014-06-09
  Filled 2014-06-09: qty 1

## 2014-06-09 MED ORDER — MORPHINE SULFATE 2 MG/ML IJ SOLN
1.0000 mg | INTRAMUSCULAR | Status: DC | PRN
  Administered 2014-06-09: 1 mg via INTRAVENOUS
  Administered 2014-06-10 – 2014-06-13 (×12): 2 mg via INTRAVENOUS
  Administered 2014-06-13: 4 mg via INTRAVENOUS
  Administered 2014-06-13 (×2): 2 mg via INTRAVENOUS
  Administered 2014-06-14 – 2014-06-15 (×8): 4 mg via INTRAVENOUS
  Administered 2014-06-15: 2 mg via INTRAVENOUS
  Administered 2014-06-17: 4 mg via INTRAVENOUS
  Administered 2014-06-18: 2 mg via INTRAVENOUS
  Filled 2014-06-09 (×2): qty 1
  Filled 2014-06-09: qty 2
  Filled 2014-06-09: qty 1
  Filled 2014-06-09: qty 2
  Filled 2014-06-09 (×2): qty 1
  Filled 2014-06-09: qty 2
  Filled 2014-06-09 (×3): qty 1
  Filled 2014-06-09 (×2): qty 2
  Filled 2014-06-09: qty 1
  Filled 2014-06-09: qty 2
  Filled 2014-06-09: qty 1
  Filled 2014-06-09: qty 2
  Filled 2014-06-09: qty 1
  Filled 2014-06-09: qty 2
  Filled 2014-06-09 (×4): qty 1
  Filled 2014-06-09 (×2): qty 2
  Filled 2014-06-09 (×2): qty 1
  Filled 2014-06-09: qty 2

## 2014-06-09 MED ORDER — ONDANSETRON HCL 4 MG/2ML IJ SOLN
INTRAMUSCULAR | Status: AC
  Filled 2014-06-09: qty 2

## 2014-06-09 MED ORDER — HEPARIN SODIUM (PORCINE) 5000 UNIT/ML IJ SOLN
5000.0000 [IU] | Freq: Three times a day (TID) | INTRAMUSCULAR | Status: DC
  Administered 2014-06-09 – 2014-06-21 (×35): 5000 [IU] via SUBCUTANEOUS
  Filled 2014-06-09 (×39): qty 1

## 2014-06-09 MED ORDER — DEXTROSE-NACL 5-0.45 % IV SOLN
INTRAVENOUS | Status: DC
  Administered 2014-06-09 – 2014-06-10 (×2): via INTRAVENOUS

## 2014-06-09 MED ORDER — LACTATED RINGERS IV SOLN
INTRAVENOUS | Status: DC
  Administered 2014-06-09: 1000 mL via INTRAVENOUS

## 2014-06-09 MED ORDER — HYDROMORPHONE HCL 2 MG/ML IJ SOLN
INTRAMUSCULAR | Status: AC
  Filled 2014-06-09: qty 1

## 2014-06-09 MED ORDER — LIDOCAINE HCL (CARDIAC) 20 MG/ML IV SOLN
INTRAVENOUS | Status: DC | PRN
  Administered 2014-06-09: 100 mg via INTRAVENOUS

## 2014-06-09 MED ORDER — POTASSIUM CHLORIDE IN NACL 20-0.45 MEQ/L-% IV SOLN: INTRAVENOUS | Status: DC

## 2014-06-09 MED ORDER — ONDANSETRON HCL 4 MG/2ML IJ SOLN
INTRAMUSCULAR | Status: DC | PRN
  Administered 2014-06-09: 4 mg via INTRAVENOUS

## 2014-06-09 MED ORDER — HYDROMORPHONE HCL 1 MG/ML IJ SOLN
0.2500 mg | INTRAMUSCULAR | Status: DC | PRN
  Administered 2014-06-09 (×3): 0.5 mg via INTRAVENOUS

## 2014-06-09 MED ORDER — ROCURONIUM BROMIDE 100 MG/10ML IV SOLN
INTRAVENOUS | Status: DC | PRN
  Administered 2014-06-09 (×2): 10 mg via INTRAVENOUS
  Administered 2014-06-09: 20 mg via INTRAVENOUS
  Administered 2014-06-09: 10 mg via INTRAVENOUS
  Administered 2014-06-09: 40 mg via INTRAVENOUS
  Administered 2014-06-09 (×2): 10 mg via INTRAVENOUS

## 2014-06-09 MED ORDER — GLYCOPYRROLATE 0.2 MG/ML IJ SOLN
INTRAMUSCULAR | Status: AC
  Filled 2014-06-09: qty 3

## 2014-06-09 MED ORDER — HYDROMORPHONE HCL 1 MG/ML IJ SOLN
INTRAMUSCULAR | Status: DC | PRN
Start: 2014-06-09 — End: 2014-06-09
  Administered 2014-06-09: 0.5 mg via INTRAVENOUS
  Administered 2014-06-09: 1 mg via INTRAVENOUS
  Administered 2014-06-09: .5 mg via INTRAVENOUS

## 2014-06-09 MED ORDER — NEOSTIGMINE METHYLSULFATE 10 MG/10ML IV SOLN
INTRAVENOUS | Status: AC
  Filled 2014-06-09: qty 1

## 2014-06-09 MED ORDER — 0.9 % SODIUM CHLORIDE (POUR BTL) OPTIME
TOPICAL | Status: DC | PRN
Start: 2014-06-09 — End: 2014-06-09
  Administered 2014-06-09: 6000 mL

## 2014-06-09 MED ORDER — CETYLPYRIDINIUM CHLORIDE 0.05 % MT LIQD
7.0000 mL | Freq: Two times a day (BID) | OROMUCOSAL | Status: DC
  Administered 2014-06-09 – 2014-06-20 (×14): 7 mL via OROMUCOSAL

## 2014-06-09 MED ORDER — CHLORHEXIDINE GLUCONATE 0.12 % MT SOLN
15.0000 mL | Freq: Two times a day (BID) | OROMUCOSAL | Status: DC
  Administered 2014-06-09 – 2014-06-19 (×17): 15 mL via OROMUCOSAL
  Filled 2014-06-09 (×18): qty 15

## 2014-06-09 MED ORDER — ROCURONIUM BROMIDE 100 MG/10ML IV SOLN
INTRAVENOUS | Status: AC
  Filled 2014-06-09: qty 1

## 2014-06-09 MED ORDER — LACTATED RINGERS IR SOLN
Status: DC | PRN
  Administered 2014-06-09: 1000 mL

## 2014-06-09 MED ORDER — BUPIVACAINE HCL 0.25 % IJ SOLN
INTRAMUSCULAR | Status: DC | PRN
  Administered 2014-06-09: 5 mL

## 2014-06-09 MED ORDER — BUPIVACAINE HCL (PF) 0.25 % IJ SOLN
INTRAMUSCULAR | Status: AC
  Filled 2014-06-09: qty 30

## 2014-06-09 MED ORDER — HYDROCODONE-ACETAMINOPHEN 5-325 MG PO TABS
1.0000 | ORAL_TABLET | ORAL | Status: DC | PRN
  Administered 2014-06-10 – 2014-06-11 (×4): 2 via ORAL
  Administered 2014-06-15: 1 via ORAL
  Administered 2014-06-15 – 2014-06-19 (×4): 2 via ORAL
  Administered 2014-06-19: 1 via ORAL
  Administered 2014-06-20: 2 via ORAL
  Filled 2014-06-09: qty 2
  Filled 2014-06-09: qty 1
  Filled 2014-06-09: qty 2
  Filled 2014-06-09: qty 1
  Filled 2014-06-09 (×5): qty 2
  Filled 2014-06-09: qty 1
  Filled 2014-06-09 (×2): qty 2

## 2014-06-09 MED ORDER — PROPOFOL 10 MG/ML IV BOLUS
INTRAVENOUS | Status: AC
  Filled 2014-06-09: qty 20

## 2014-06-09 SURGICAL SUPPLY — 55 items
APL SKNCLS STERI-STRIP NONHPOA (GAUZE/BANDAGES/DRESSINGS) ×1
BENZOIN TINCTURE PRP APPL 2/3 (GAUZE/BANDAGES/DRESSINGS) ×3 IMPLANT
BINDER ABDOMINAL 12 ML 46-62 (SOFTGOODS) ×3 IMPLANT
BLADE SURG 15 STRL LF DISP TIS (BLADE) ×1 IMPLANT
BLADE SURG 15 STRL SS (BLADE) ×3
BLADE SURG SZ20 CARB STEEL (BLADE) ×3 IMPLANT
CLOSURE WOUND 1/4X4 (GAUZE/BANDAGES/DRESSINGS) ×1
DECANTER SPIKE VIAL GLASS SM (MISCELLANEOUS) IMPLANT
DEVICE SECURE STRAP 25 ABSORB (INSTRUMENTS) ×6 IMPLANT
DEVICE TROCAR PUNCTURE CLOSURE (ENDOMECHANICALS) ×3 IMPLANT
DISSECTOR BLUNT TIP ENDO 5MM (MISCELLANEOUS) ×3 IMPLANT
DRAIN CHANNEL 19F RND (DRAIN) ×6 IMPLANT
DRAIN CHANNEL RND F F (WOUND CARE) IMPLANT
DRAPE INCISE IOBAN 66X45 STRL (DRAPES) ×6 IMPLANT
DRAPE LAPAROSCOPIC ABDOMINAL (DRAPES) ×3 IMPLANT
ELECT BLADE TIP CTD 4 INCH (ELECTRODE) ×3 IMPLANT
ELECT REM PT RETURN 9FT ADLT (ELECTROSURGICAL) ×3
ELECTRODE REM PT RTRN 9FT ADLT (ELECTROSURGICAL) ×1 IMPLANT
EVACUATOR SILICONE 100CC (DRAIN) ×6 IMPLANT
GLOVE BIOGEL PI IND STRL 7.0 (GLOVE) ×4 IMPLANT
GLOVE BIOGEL PI INDICATOR 7.0 (GLOVE) ×8
GLOVE SURG SIGNA 7.5 PF LTX (GLOVE) ×6 IMPLANT
GOWN SPEC L4 XLG W/TWL (GOWN DISPOSABLE) ×3 IMPLANT
GOWN STRL REUS W/ TWL XL LVL3 (GOWN DISPOSABLE) ×3 IMPLANT
GOWN STRL REUS W/TWL LRG LVL3 (GOWN DISPOSABLE) ×3 IMPLANT
GOWN STRL REUS W/TWL XL LVL3 (GOWN DISPOSABLE) ×9
KIT BASIN OR (CUSTOM PROCEDURE TRAY) ×3 IMPLANT
LIQUID BAND (GAUZE/BANDAGES/DRESSINGS) IMPLANT
MARKER SKIN DUAL TIP RULER LAB (MISCELLANEOUS) ×3 IMPLANT
MESH PARIETEX 25X20 (Mesh General) ×3 IMPLANT
NEEDLE INSUFFLATION 14GA 150MM (NEEDLE) IMPLANT
NEEDLE SPNL 22GX3.5 QUINCKE BK (NEEDLE) ×3 IMPLANT
NS IRRIG 1000ML POUR BTL (IV SOLUTION) ×3 IMPLANT
PENCIL BUTTON HOLSTER BLD 10FT (ELECTRODE) ×3 IMPLANT
SCISSORS LAP 5X35 DISP (ENDOMECHANICALS) ×3 IMPLANT
SET IRRIG TUBING LAPAROSCOPIC (IRRIGATION / IRRIGATOR) ×3 IMPLANT
SHEARS HARMONIC ACE PLUS 36CM (ENDOMECHANICALS) ×3 IMPLANT
SLEEVE ADV FIXATION 5X100MM (TROCAR) ×12 IMPLANT
SOLUTION ANTI FOG 6CC (MISCELLANEOUS) ×3 IMPLANT
SPONGE LAP 18X18 X RAY DECT (DISPOSABLE) ×3 IMPLANT
STAPLER VISISTAT 35W (STAPLE) ×3 IMPLANT
STRIP CLOSURE SKIN 1/4X4 (GAUZE/BANDAGES/DRESSINGS) ×2 IMPLANT
SUT ETHILON 2 0 PS N (SUTURE) ×12 IMPLANT
SUT NOVA 0 T19/GS 22DT (SUTURE) ×12 IMPLANT
SUT NOVA 1 T20/GS 25DT (SUTURE) ×6 IMPLANT
SUT VIC AB 5-0 PS2 18 (SUTURE) IMPLANT
SYR BULB IRRIGATION 50ML (SYRINGE) ×3 IMPLANT
TACKER 5MM HERNIA 3.5CML NAB (ENDOMECHANICALS) IMPLANT
TAPE CLOTH SURG 6X10 WHT LF (GAUZE/BANDAGES/DRESSINGS) ×3 IMPLANT
TOWEL OR 17X26 10 PK STRL BLUE (TOWEL DISPOSABLE) ×3 IMPLANT
TRAY LAPAROSCOPIC (CUSTOM PROCEDURE TRAY) ×3 IMPLANT
TROCAR BLADELESS OPT 5 75 (ENDOMECHANICALS) ×3 IMPLANT
TROCAR XCEL NON-BLD 11X100MML (ENDOMECHANICALS) ×6 IMPLANT
TUBING INSUFFLATION 10FT LAP (TUBING) IMPLANT
YANKAUER SUCT BULB TIP 10FT TU (MISCELLANEOUS) ×3 IMPLANT

## 2014-06-09 NOTE — Interval H&P Note (Signed)
History and Physical Interval Note:  06/09/2014 8:48 AM  Jesus [name]  has presented today for surgery, with the diagnosis of ventral incisional hernia  The various methods of treatment have been discussed with the patient and family. His oldest son, Sharma Covertorman, is here with the patient.  After consideration of risks, benefits and other options for treatment, the patient has consented to  Procedure(s): LAPAROSCOPIC VENTRAL HERNIA/POSSIBLE OPEN REPAIR (N/A) as a surgical intervention .  The patient's history has been reviewed, patient examined, no change in status, stable for surgery.  I have reviewed the patient's chart and labs.  Questions were answered to the patient's satisfaction.     Shishir Krantz H

## 2014-06-09 NOTE — Anesthesia Postprocedure Evaluation (Signed)
  Anesthesia Post-op Note  Patient: Otho Perlorman F Stough  Procedure(s) Performed: Procedure(s): LAPAROSCOPIC ASSISTED OPEN VENTRAL HERNIA REPAIR (N/A) LAPAROSCOPIC LYSIS OF ADHESIONS (N/A)  Patient Location: PACU  Anesthesia Type:General  Level of Consciousness: awake and alert   Airway and Oxygen Therapy: Patient Spontanous Breathing  Post-op Pain: none  Post-op Assessment: Post-op Vital signs reviewed  Post-op Vital Signs: Reviewed  Last Vitals:  Filed Vitals:   06/09/14 1554  BP: 149/73  Pulse: 92  Temp: 36.8 C  Resp: 17    Complications: No apparent anesthesia complications

## 2014-06-09 NOTE — Transfer of Care (Signed)
Immediate Anesthesia Transfer of Care Note  Patient: Otho Perlorman F Noto  Procedure(s) Performed: Procedure(s): LAPAROSCOPIC ASSISTED OPEN VENTRAL HERNIA REPAIR (N/A) LAPAROSCOPIC LYSIS OF ADHESIONS (N/A)  Patient Location: PACU  Anesthesia Type:General  Level of Consciousness: awake and pateint uncooperative  Airway & Oxygen Therapy: Patient Spontanous Breathing and Patient connected to nasal cannula oxygen  Post-op Assessment: Report given to PACU RN and Post -op Vital signs reviewed and stable  Post vital signs: Reviewed and stable  Complications: No apparent anesthesia complications

## 2014-06-09 NOTE — Anesthesia Preprocedure Evaluation (Addendum)
Anesthesia Evaluation  Patient identified by MRN, date of birth, ID band Patient awake    Reviewed: Allergy & Precautions, NPO status , Patient's Chart, lab work & pertinent test results  Airway Mallampati: III  TM Distance: >3 FB Neck ROM: Full    Dental  (+) Teeth Intact   Pulmonary former smoker,          Cardiovascular hypertension, + CAD, + Past MI, + Cardiac Stents and + CABG + Valvular Problems/Murmurs (Mild to moderate AS. EF 55-60% on 07/2013 echo.) AS  07/2013 Myoview with evidence of scar but no evidence of area of inducible ischemia.   Neuro/Psych negative neurological ROS  negative psych ROS   GI/Hepatic Neg liver ROS, PUD, GERD-  ,  Endo/Other  Morbid obesity  Renal/GU CRFRenal disease     Musculoskeletal  (+) Arthritis -,   Abdominal   Peds  Hematology negative hematology ROS (+)   Anesthesia Other Findings   Reproductive/Obstetrics                            Anesthesia Physical Anesthesia Plan  ASA: III  Anesthesia Plan: General   Post-op Pain Management:    Induction: Intravenous  Airway Management Planned: Oral ETT  Additional Equipment:   Intra-op Plan:   Post-operative Plan: Extubation in OR  Informed Consent: I have reviewed the patients History and Physical, chart, labs and discussed the procedure including the risks, benefits and alternatives for the proposed anesthesia with the patient or authorized representative who has indicated his/her understanding and acceptance.   Dental advisory given  Plan Discussed with: CRNA  Anesthesia Plan Comments:         Anesthesia Quick Evaluation

## 2014-06-10 LAB — CBC
HCT: 39.4 % (ref 39.0–52.0)
HEMOGLOBIN: 12.5 g/dL — AB (ref 13.0–17.0)
MCH: 31.7 pg (ref 26.0–34.0)
MCHC: 31.7 g/dL (ref 30.0–36.0)
MCV: 100 fL (ref 78.0–100.0)
PLATELETS: 209 10*3/uL (ref 150–400)
RBC: 3.94 MIL/uL — AB (ref 4.22–5.81)
RDW: 13.4 % (ref 11.5–15.5)
WBC: 8.1 10*3/uL (ref 4.0–10.5)

## 2014-06-10 LAB — BASIC METABOLIC PANEL
Anion gap: 5 (ref 5–15)
BUN: 24 mg/dL — ABNORMAL HIGH (ref 6–23)
CO2: 25 mmol/L (ref 19–32)
Calcium: 8.4 mg/dL (ref 8.4–10.5)
Chloride: 104 mEq/L (ref 96–112)
Creatinine, Ser: 1.3 mg/dL (ref 0.50–1.35)
GFR calc Af Amer: 57 mL/min — ABNORMAL LOW (ref >90)
GFR calc non Af Amer: 49 mL/min — ABNORMAL LOW (ref >90)
Glucose, Bld: 155 mg/dL — ABNORMAL HIGH (ref 70–99)
Potassium: 4.2 mmol/L (ref 3.5–5.1)
Sodium: 134 mmol/L — ABNORMAL LOW (ref 135–145)

## 2014-06-10 MED ORDER — SODIUM CHLORIDE 0.9 % IV BOLUS (SEPSIS)
500.0000 mL | Freq: Once | INTRAVENOUS | Status: AC
  Administered 2014-06-10: 500 mL via INTRAVENOUS

## 2014-06-10 MED ORDER — KCL IN DEXTROSE-NACL 20-5-0.45 MEQ/L-%-% IV SOLN
INTRAVENOUS | Status: DC
  Administered 2014-06-10 – 2014-06-11 (×3): via INTRAVENOUS
  Administered 2014-06-11 (×2): 125 mL via INTRAVENOUS
  Administered 2014-06-12 – 2014-06-15 (×4): via INTRAVENOUS
  Administered 2014-06-16 – 2014-06-17 (×2): 75 mL/h via INTRAVENOUS
  Administered 2014-06-17 – 2014-06-20 (×5): via INTRAVENOUS
  Filled 2014-06-10 (×24): qty 1000

## 2014-06-10 NOTE — Op Note (Signed)
NAMDorothea Ogle:  Bellotti, Rawley                 ACCOUNT NO.:  [id number]  MEDICAL RECORD NO.:  [id number]  LOCATION:  1234                         [facility]  PHYSICIAN:  Sandria Balesavid H. Ezzard StandingNewman, M.D.  DATE OF BIRTH:  03-Nov-1928  DATE OF PROCEDURE:  06/09/2014                              OPERATIVE REPORT   PREOPERATIVE DIAGNOSIS:  Ventral incisional hernia.  POSTOPERATIVE DIAGNOSIS:  Ventral incisional hernia approximately 10 x 12 cm, right subcostal hernia, left inguinal hernia.  OPERATION PERFORMED:  Laparoscopic enterolysis of adhesions for 70 minutes and a combined laparoscopic open repair of abdominal wall with Parietex mesh.  SURGEON:  Sandria Balesavid H. Ezzard StandingNewman, M.D.  FIRST ASSISTANT:  No first assistant.  ANESTHESIA:  General endotracheal.  ESTIMATED BLOOD LOSS:  Less than 100 mL.  DRAINS LEFT IN:  Two Blake drains in the subcutaneous tissues using 19- Blake drain.  INDICATION FOR PROCEDURE:  Mr. Sharma Covertorman is an 81 year old white male who is a patient of Dr. Duane LopeAlan Ross who has had a prior open cholecystectomy in 1972 and then he had an abdominal exploration for bowel obstruction enterolysis by Dr. Abbey Chattersosenbower in May 2007.  That wound had delayed healing and about 3 years ago he noticed an increasing bulge in his abdominal wall.  This bulge has become larger over the last year's time. I saw Mr. Lamar SprinklesLang in June 2015.  We discussed potential hernia surgery.  At his age, I thought this is a big operation and carried some significant morbidity, and at that time he held off proceeding with surgery.  But the has bothered him enough that he wants to try to have this hernia repaired.  The indications and potential complications of hernia repair were explained to the patient.  Potential complications include, but not limited to, bleeding, infection, nerve injury, and recurrence of the hernia.  OPERATIVE NOTE:  The patient was [facility] to [facility]10 [facility].  He underwent general endotracheal anesthetic.  The  anesthesia was supervised by Dr. Marcene Duosobert Fitzgerald.   The patient had both his arms tucked, Foley catheter in place, and was given antibiotics at the beginning of the operation.    A time-out was held and surgical checklist run.  I accessed his abdominal cavity through the left upper quadrant with a 5 mm Optiview and I placed a total of 6 different trocars.  They were all 5 mm trocars and placed: 1 in the left upper quadrant, 1 subxiphoid, 1 right upper quadrant, 1 right lateral quadrant, 1 midline below the umbilicus and 1 left lateral quadrant.  I first started with enterolysis, he had small bowel stuck around the edge of the hernia defect.  His rectus muscles I had pulled apart probably on the order about 10 cm in the lower midline.  I spent about 70 minutes doing enterolysis laparoscopically, taking down small bowel, which was adherent to this to the edges into the undersurface of the hernia sac and omentum.  I also took down adhesions under his right costal margin and found a hernia defect in his old right subcostal incision.  I did not plan to repair that hernia today.  After I had done the enterolysis of  adhesions, it was clear this will not be repaired simply by laparoscopic incision.  I made an open excision, excised the hernia sac, and I went down and mobilized his rectus muscles doing a component separation.  I went lateral to the aponeurosis of the rectus abdominis by about 1 or 2 cm and divided away the external oblique aponeurosis.  This allowed me to pull the rectus muscle towards the midline.  I was able to move the rectus probably 3-5 cm from each side.  The caudal aspect of the defect he had fascia which appeared to be intact about 4-5 cm above the pubic bone.  I took down the preperitonal fat to below the pubic bone to create a space to lay the mesh.    I then used a piece of 20 x 25 cm Parietex mesh and  I put 12 holding sutures on using 0 Novafil suture..  I placed 1 suture through  the fascia, just above the pubic bone in the midline and I  laid the mesh in the abdominal cavity.  I then closed the midline with interrupted #1 Novafil sutures, which approximated the rectus muscle toward midline.  I then laparoscoped the patient again.  I found the 11 sutures holding the mesh, captured these sutures with an Endoclose through his abdominal wall and brought mesh to the anterior abdominal wall and tied these down.  I then used the Securestrap from [place] and tackede the mesh against the anterior abdominal wall.    I desufflated the abdomen once to make sure there were no areas where I thought the bowel could sneak under or around the edge of the mesh.  I had developed skin flaps laterally about 8-10 cm on both sides.  This left dead space under the skin flaps,  so through the trocar sites on the left side and the right side, I brought 2 19-Blake drain to lie in this space, which was anterior to the rectus muscle, but underneath the subcutaneous fat.  These were sewn in place with 2-0 nylon sutures.  I was worried about ischemia of his umbilicus because of his prior surgery, so I excised this.  I then closed the subcutaneous tissues with interrupted 3-0 Vicryl suture, the skin with a skin staple, and 2-0 nylon sutures.  I stapled his different trocar sites and the little stab wounds I did to bring the mesh out.  The patient tolerated procedure well, but because of the age and length of the operation, I will plan to put a stent down overnight.  He will have to wear an abdominal binder.  The patient tolerated the procedure well.  Sponge and needle counts were correct at the end of the case.  He was transferred to the recovery room in good condition.  Type of repair - Component separation with mesh  (choices - primary suture, mesh, or component)  Name of mesh - Parietex  Size of mesh - Length 25 cm, Width 20 cm  Mesh overlap - 8 cm  Placement of mesh - beneath fascia and into  peritoneal cavity  (choices - beneath fascia and into peritoneal cavity, beneath fascia but external to peritoneal cavity, between the muscle and fascia, above or external to fascia)   Sandria Bales. Ezzard Standing, M.D., FACS   DHN/MEDQ  D:  06/09/2014  T:  06/10/2014  Job:  [id number]  cc:   Magnus Sinning) Tenny Craw, M.D. Fax: [phone]  Armanda Magic, M.D. Fax: [phone]

## 2014-06-10 NOTE — Progress Notes (Signed)
General Surgery Note  LOS: 1 day  POD -  1 Day Post-Op  Assessment/Plan: 1.  LAPAROSCOPIC ASSISTED OPEN VENTRAL HERNIA REPAIR, LAPAROSCOPIC LYSIS OF ADHESIONS - 06/09/2014 - D. [employer]  Looks okay early post op  Will start some sips from the floor.  2. HTN 3. CAD CABG - 2005 Stent placed about 2010. 08/01/2013 - Nuc myocardial perfusion - Moderate inferolateral and anterolateral defect consistent with scar and soft tissue attnenuation with minimal periinfarct ischemia. LV Ejection Fraction: 47%. LV Wall Motion: Lateral hypokinesis.  Dr. Armanda Magicraci Turner sent a note 12/12/2013 that said he is low risk for surgery. 4. Neuropathy - Both feet, etiology unclear. 5. BPH -sees Dr. Ranelle OysterJ. Wrenn 6. GERD, but stopped meds about one year ago. 7. Heart murmur 8.  DVT prophylaxis - On SQ Heparin   Active Problems:   Ventral hernia without obstruction or gangrene   Subjective:  Doing okay.  Has been out of bed.  Sore.  No nausea. Objective:   Filed Vitals:   06/10/14 0800  BP: 141/82  Pulse: 95  Temp: 98.4 F (36.9 C)  Resp: 18     Intake/Output from previous day:  01/15 0701 - 01/16 0700 In: 3660.4 [I.V.:3660.4] Out: 1640 [Urine:1525; Drains:115]  Intake/Output this shift:  Total I/O In: 125 [I.V.:125] Out: 80 [Urine:80]   Physical Exam:   General: [name] who is alert and oriented.    HEENT: Normal. Pupils equal. .   Lungs: Clear.   Abdomen: In binder.   Wound: Drain output recorded - R/L - 90/25 cc     Lab Results:    Recent Labs  06/07/14 1115 06/10/14 0405  WBC 7.3 8.1  HGB 13.2 12.5*  HCT 40.6 39.4  PLT 215 209    BMET   Recent Labs  06/07/14 1130 06/10/14 0405  NA 142 134*  K 5.4* 4.2  CL 109 104  CO2 28 25  GLUCOSE 90 155*  BUN 26* 24*  CREATININE 1.33 1.30  CALCIUM 9.5 8.4    PT/INR  No results for input(s): LABPROT, INR in the last 72 hours.  ABG  No results for input(s): PHART, HCO3 in the last 72  hours.  Invalid input(s): PCO2, PO2   Studies/Results:  No results found.   Anti-infectives:   Anti-infectives    Start     Dose/Rate Route Frequency Ordered Stop   06/09/14 0719  ceFAZolin (ANCEF) IVPB 2 g/50 mL premix     2 g100 mL/hr over 30 Minutes Intravenous On call to O.R. 06/09/14 0719 06/09/14 0930      Ovidio Kinavid Creola Krotz, MD, FACS Pager: [phone] [facility] Office: [phone] 06/10/2014

## 2014-06-11 LAB — CBC WITH DIFFERENTIAL/PLATELET
BASOS ABS: 0 10*3/uL (ref 0.0–0.1)
Basophils Relative: 0 % (ref 0–1)
Eosinophils Absolute: 0.1 10*3/uL (ref 0.0–0.7)
Eosinophils Relative: 1 % (ref 0–5)
HCT: 36.2 % — ABNORMAL LOW (ref 39.0–52.0)
HEMOGLOBIN: 11.5 g/dL — AB (ref 13.0–17.0)
Lymphocytes Relative: 9 % — ABNORMAL LOW (ref 12–46)
Lymphs Abs: 0.7 10*3/uL (ref 0.7–4.0)
MCH: 32 pg (ref 26.0–34.0)
MCHC: 31.8 g/dL (ref 30.0–36.0)
MCV: 100.8 fL — ABNORMAL HIGH (ref 78.0–100.0)
MONO ABS: 0.8 10*3/uL (ref 0.1–1.0)
MONOS PCT: 10 % (ref 3–12)
Neutro Abs: 6.5 10*3/uL (ref 1.7–7.7)
Neutrophils Relative %: 80 % — ABNORMAL HIGH (ref 43–77)
Platelets: 201 10*3/uL (ref 150–400)
RBC: 3.59 MIL/uL — ABNORMAL LOW (ref 4.22–5.81)
RDW: 13.4 % (ref 11.5–15.5)
WBC: 8 10*3/uL (ref 4.0–10.5)

## 2014-06-11 LAB — BASIC METABOLIC PANEL
ANION GAP: 5 (ref 5–15)
BUN: 19 mg/dL (ref 6–23)
CALCIUM: 8 mg/dL — AB (ref 8.4–10.5)
CO2: 23 mmol/L (ref 19–32)
Chloride: 103 mEq/L (ref 96–112)
Creatinine, Ser: 1.23 mg/dL (ref 0.50–1.35)
GFR calc Af Amer: 61 mL/min — ABNORMAL LOW (ref >90)
GFR calc non Af Amer: 52 mL/min — ABNORMAL LOW (ref >90)
GLUCOSE: 143 mg/dL — AB (ref 70–99)
POTASSIUM: 4.3 mmol/L (ref 3.5–5.1)
SODIUM: 131 mmol/L — AB (ref 135–145)

## 2014-06-11 MED ORDER — ALBUTEROL SULFATE (2.5 MG/3ML) 0.083% IN NEBU
2.5000 mg | INHALATION_SOLUTION | RESPIRATORY_TRACT | Status: DC | PRN
  Administered 2014-06-11 – 2014-06-17 (×3): 2.5 mg via RESPIRATORY_TRACT
  Filled 2014-06-11 (×2): qty 3

## 2014-06-11 NOTE — Progress Notes (Addendum)
General Surgery Note  LOS: 2 days  POD -  2 Days Post-Op  Assessment/Plan: 1.  LAPAROSCOPIC ASSISTED OPEN VENTRAL HERNIA REPAIR, LAPAROSCOPIC LYSIS OF ADHESIONS - 06/09/2014 - D. Nahomy Limburg  Doing okay.  Minimal bowel function - still with ileus - stay on sips  2. HTN 3. CAD CABG - 2005 Stent placed about 2010. 08/01/2013 - Nuc myocardial perfusion - Moderate inferolateral and anterolateral defect consistent with scar and soft tissue attnenuation with minimal periinfarct ischemia. LV Ejection Fraction: 47%. LV Wall Motion: Lateral hypokinesis.  Dr. Armanda Magicraci Turner sent a note 12/12/2013 that said he is low risk for surgery. 4. Neuropathy - Both feet, etiology unclear. 5. BPH -sees Dr. Ranelle OysterJ. Wrenn  Will leave foley for now until he shows more signs of bowel function. 6. GERD, but stopped meds about one year ago. 7. Heart murmur 8.  DVT prophylaxis - On SQ Heparin 9.  Confusion last PM  Some question of how much he drinks - possible early withdrawal vs major surgery in an old patient.   Active Problems:   Ventral hernia without obstruction or gangrene  Subjective:  Doing okay, but figity.  No nausea, but not taking much PO.  Son, Jesus [name] (from [place]), in room. Objective:   Filed Vitals:   06/11/14 0821  BP: 144/79  Pulse: 104  Temp:   Resp: 22     Intake/Output from previous day:  01/16 0701 - 01/17 0700 In: 3615 [P.O.:240; I.V.:2875; IV Piggyback:500] Out: 1125 [Urine:945; Drains:180]  Intake/Output this shift:  Total I/O In: 125 [I.V.:125] Out: -    Physical Exam:   General: [name] who is alert and oriented.    HEENT: Normal. Pupils equal. .   Lungs: Clear.  Does IS about 800 cc.   Abdomen: Rare BS.  Sore all over.   Wound: Drain output recorded - R/L - 95/85 cc     Lab Results:     Recent Labs  06/10/14 0405 06/11/14 0402  WBC 8.1 8.0  HGB 12.5* 11.5*  HCT 39.4 36.2*  PLT 209 201    BMET    Recent Labs   06/10/14 0405 06/11/14 0402  NA 134* 131*  K 4.2 4.3  CL 104 103  CO2 25 23  GLUCOSE 155* 143*  BUN 24* 19  CREATININE 1.30 1.23  CALCIUM 8.4 8.0*    PT/INR  No results for input(s): LABPROT, INR in the last 72 hours.  ABG  No results for input(s): PHART, HCO3 in the last 72 hours.  Invalid input(s): PCO2, PO2   Studies/Results:  No results found.   Anti-infectives:   Anti-infectives    Start     Dose/Rate Route Frequency Ordered Stop   06/09/14 0719  ceFAZolin (ANCEF) IVPB 2 g/50 mL premix     2 g100 mL/hr over 30 Minutes Intravenous On call to O.R. 06/09/14 0719 06/09/14 0930      Ovidio Kinavid Nollan Muldrow, MD, FACS Pager: [phone] [facility]: [phone] 06/11/2014

## 2014-06-11 NOTE — Progress Notes (Signed)
At about 0300 Am pt woke up confused, agitated, and attempting to get out of bed. Pt oxygen saturation dropped to the 60s and 70s, pt refused oxygen source from staff. Pt was also having audible wheeze during this episode, order for Albuterol was received from [facility] doctor and administered by respiratory therapist. Pt also complained of pain in the abdomen and was given Morphine IV as ordered. Pt eventually settled down and was placed on a non-rebreather mask, he is currently in bed resting with a non-rebreather mask on.

## 2014-06-11 NOTE — Progress Notes (Signed)
Friend in room visiting with patient. Called to room by staff patient was up out chair unassisted stating he is going home. Patient appears to be confused not following commands as instructed. Patient assisted back to bed with help x 2. Call bell within reach and bed alarm on.

## 2014-06-12 ENCOUNTER — Encounter (HOSPITAL_COMMUNITY): Payer: Self-pay | Admitting: Surgery

## 2014-06-12 LAB — CBC WITH DIFFERENTIAL/PLATELET
Basophils Absolute: 0 10*3/uL (ref 0.0–0.1)
Basophils Relative: 0 % (ref 0–1)
EOS PCT: 1 % (ref 0–5)
Eosinophils Absolute: 0.1 10*3/uL (ref 0.0–0.7)
HCT: 34.5 % — ABNORMAL LOW (ref 39.0–52.0)
Hemoglobin: 11.2 g/dL — ABNORMAL LOW (ref 13.0–17.0)
Lymphocytes Relative: 9 % — ABNORMAL LOW (ref 12–46)
Lymphs Abs: 0.7 10*3/uL (ref 0.7–4.0)
MCH: 32.5 pg (ref 26.0–34.0)
MCHC: 32.5 g/dL (ref 30.0–36.0)
MCV: 100 fL (ref 78.0–100.0)
Monocytes Absolute: 0.9 10*3/uL (ref 0.1–1.0)
Monocytes Relative: 13 % — ABNORMAL HIGH (ref 3–12)
NEUTROS ABS: 5.6 10*3/uL (ref 1.7–7.7)
Neutrophils Relative %: 77 % (ref 43–77)
Platelets: 202 10*3/uL (ref 150–400)
RBC: 3.45 MIL/uL — ABNORMAL LOW (ref 4.22–5.81)
RDW: 13.3 % (ref 11.5–15.5)
WBC: 7.2 10*3/uL (ref 4.0–10.5)

## 2014-06-12 LAB — BASIC METABOLIC PANEL
Anion gap: 3 — ABNORMAL LOW (ref 5–15)
BUN: 15 mg/dL (ref 6–23)
CALCIUM: 8.1 mg/dL — AB (ref 8.4–10.5)
CO2: 24 mmol/L (ref 19–32)
CREATININE: 1.11 mg/dL (ref 0.50–1.35)
Chloride: 108 mEq/L (ref 96–112)
GFR calc Af Amer: 69 mL/min — ABNORMAL LOW (ref >90)
GFR calc non Af Amer: 59 mL/min — ABNORMAL LOW (ref >90)
Glucose, Bld: 120 mg/dL — ABNORMAL HIGH (ref 70–99)
Potassium: 4.2 mmol/L (ref 3.5–5.1)
SODIUM: 135 mmol/L (ref 135–145)

## 2014-06-12 MED ORDER — LORAZEPAM 2 MG/ML IJ SOLN
1.0000 mg | Freq: Four times a day (QID) | INTRAMUSCULAR | Status: AC | PRN
  Administered 2014-06-12 – 2014-06-13 (×3): 1 mg via INTRAVENOUS
  Filled 2014-06-12 (×4): qty 1

## 2014-06-12 MED ORDER — LORAZEPAM 2 MG/ML IJ SOLN
0.0000 mg | Freq: Four times a day (QID) | INTRAMUSCULAR | Status: AC
  Administered 2014-06-12 – 2014-06-13 (×4): 2 mg via INTRAVENOUS
  Filled 2014-06-12 (×3): qty 1

## 2014-06-12 MED ORDER — LORAZEPAM 1 MG PO TABS: 1.0000 mg | ORAL_TABLET | Freq: Four times a day (QID) | ORAL | Status: AC | PRN

## 2014-06-12 MED ORDER — LORAZEPAM 2 MG/ML IJ SOLN
0.0000 mg | Freq: Two times a day (BID) | INTRAMUSCULAR | Status: AC
  Administered 2014-06-16: 4 mg via INTRAVENOUS
  Filled 2014-06-12: qty 1
  Filled 2014-06-12: qty 2
  Filled 2014-06-12: qty 1

## 2014-06-12 MED ORDER — HALOPERIDOL LACTATE 5 MG/ML IJ SOLN
3.0000 mg | Freq: Four times a day (QID) | INTRAMUSCULAR | Status: DC | PRN
  Administered 2014-06-12 – 2014-06-14 (×3): 3 mg via INTRAVENOUS
  Filled 2014-06-12 (×4): qty 1

## 2014-06-12 NOTE — Progress Notes (Signed)
General Surgery Note  LOS: 3 days  POD -  3 Days Post-Op  Assessment/Plan: 1.  LAPAROSCOPIC ASSISTED OPEN VENTRAL HERNIA REPAIR, LAPAROSCOPIC LYSIS OF ADHESIONS - 06/09/2014 - D. Juwan Vences  Doing okay.  Has passed flatus - on clear liquids.  His main problem now is his confusion.  2. HTN 3. CAD CABG - 2005 Stent placed about 2010. 08/01/2013 - Nuc myocardial perfusion - Moderate inferolateral and anterolateral defect consistent with scar and soft tissue attnenuation with minimal periinfarct ischemia. LV Ejection Fraction: 47%. LV Wall Motion: Lateral hypokinesis.  Dr. Armanda Magicraci Turner sent a note 12/12/2013 that said he is low risk for surgery. 4. Neuropathy - Both feet, etiology unclear. 5. BPH -sees Dr. Ranelle OysterJ. Wrenn  Will leave foley   I would like for his confusion to clear some. 6. GERD, but stopped meds about one year ago. 7. Heart murmur 8.  DVT prophylaxis - On SQ Heparin 9.  Confusion   Worse at night  Question of amount of alcohol use at home - I have held off sedation so far, but would probably use Haldol.   Active Problems:   Ventral hernia without obstruction or gangrene  Subjective:  Doing okay.  Has passed large amount of gas.  He is tolerating clear liquids.  He wants some regular food.  Son, Sharma Covertorman (from [place]), in room.  I discussed post op course. Objective:   Filed Vitals:   06/12/14 1102  BP: 145/46  Pulse: 114  Temp:   Resp: 22     Intake/Output from previous day:  01/17 0701 - 01/18 0700 In: 3415 [P.O.:540; I.V.:2875] Out: 2540 [Urine:2375; Drains:165]  Intake/Output this shift:  Total I/O In: 500 [I.V.:500] Out: 600 [Urine:600]   Physical Exam:   General: [name] who is alert.  He knows that he is in a hospital, but unsure which one.  He remembers me, but cannot name me.   HEENT: Normal. Pupils equal. .   Lungs: Clear.     Abdomen: BS present.  Seems to complain of less pain.   Wound: Drain output recorded -  R/L - 120/45 cc     Lab Results:     Recent Labs  06/11/14 0402 06/12/14 0346  WBC 8.0 7.2  HGB 11.5* 11.2*  HCT 36.2* 34.5*  PLT 201 202    BMET    Recent Labs  06/11/14 0402 06/12/14 0346  NA 131* 135  K 4.3 4.2  CL 103 108  CO2 23 24  GLUCOSE 143* 120*  BUN 19 15  CREATININE 1.23 1.11  CALCIUM 8.0* 8.1*    PT/INR  No results for input(s): LABPROT, INR in the last 72 hours.  ABG  No results for input(s): PHART, HCO3 in the last 72 hours.  Invalid input(s): PCO2, PO2   Studies/Results:  No results found.   Anti-infectives:   Anti-infectives    Start     Dose/Rate Route Frequency Ordered Stop   06/09/14 0719  ceFAZolin (ANCEF) IVPB 2 g/50 mL premix     2 g100 mL/hr over 30 Minutes Intravenous On call to O.R. 06/09/14 0719 06/09/14 0930      Ovidio Kinavid Nazire Fruth, MD, FACS Pager: [phone] [facility] Office: [phone] 06/12/2014

## 2014-06-12 NOTE — Progress Notes (Addendum)
Pt had 5 episodes of confusion, found several times trying to get out of his bed unassisted, saying he wants to get up. Pt was assisted from bed to chair about 4 times, he also ambulated the hall at about 0200 AM. Throughout the night pt had to be redirected and reoriented to his environment.

## 2014-06-12 NOTE — Plan of Care (Signed)
Problem: Phase II Progression Outcomes Goal: Pain controlled Outcome: Progressing No c/o pain during this shift.   Goal: Progress activity as tolerated unless otherwise ordered Outcome: Progressing Pt ambulated with assist in the hallway, half the unit, tolerated well. Goal: Progressing with IS, TCDB Outcome: Progressing Using IS when instructed, needs to be reminded and re-instructed on how to use. Goal: Surgical site without signs of infection Dressing D+I, Dr. Ezzard StandingNewman to see patient.  Not to remove dressing until tomorrow on rounds. Goal: Return of bowel function (flatus, BM) IF ABDOMINAL SURGERY:  Outcome: Progressing Pt passing gas and had large, formed BM this am. Goal: Foley discontinued Outcome: Progressing Good UOP, continued monitoring.  Dr. Ezzard StandingNewman has given order to keep foley with re-assessment each day. Goal: Tolerating diet Outcome: Progressing Tolerating clear liquid diet.

## 2014-06-12 NOTE — Progress Notes (Signed)
Noted increased agitation around 1600 which escalated to the point where patient no longer listening to his son, Sharma Covertorman and following safe limits.  Initially, this RN received order at 1657 from Dr. Ezzard StandingNewman for Haldol 3mg  IV Q 6hrs.  This medicine had no affect on patient, he started to push this RN and yell obscenities, security needed to be call for assistance.   This RN received orders for Dr. Michaell CowingGross for restraints and CIWA protocol to be followed.  His initial was CIWA scale was 31.  Lorazapam 2mg  IV was administered, (4mg  was indicated, but not given).  Pt. fell asleep with this dose.  Upon talking with this patient and family two days ago, he does drink an evening Scotch nightly, when asked if this is just one drink, the patient and sons laughed but did not answer question.  Will continue to watch the patient closely, maintain CIWA protocol and bilateral wrist restraints as long as pt requires for safety.  Ok EdwardsSheila Zayne Draheim, RN

## 2014-06-13 LAB — BASIC METABOLIC PANEL
Anion gap: 5 (ref 5–15)
BUN: 15 mg/dL (ref 6–23)
CALCIUM: 8.5 mg/dL (ref 8.4–10.5)
CHLORIDE: 109 meq/L (ref 96–112)
CO2: 25 mmol/L (ref 19–32)
Creatinine, Ser: 1.13 mg/dL (ref 0.50–1.35)
GFR calc Af Amer: 67 mL/min — ABNORMAL LOW (ref >90)
GFR calc non Af Amer: 58 mL/min — ABNORMAL LOW (ref >90)
GLUCOSE: 119 mg/dL — AB (ref 70–99)
POTASSIUM: 4.2 mmol/L (ref 3.5–5.1)
SODIUM: 139 mmol/L (ref 135–145)

## 2014-06-13 LAB — MAGNESIUM: Magnesium: 1.8 mg/dL (ref 1.5–2.5)

## 2014-06-13 MED ORDER — LABETALOL HCL 5 MG/ML IV SOLN
5.0000 mg | INTRAVENOUS | Status: DC | PRN
  Administered 2014-06-14 – 2014-06-15 (×3): 5 mg via INTRAVENOUS
  Filled 2014-06-13 (×4): qty 4

## 2014-06-13 NOTE — Progress Notes (Signed)
Pt having periods of tachycardia with frequent-occasional PACs and PVCS and episodes of Afib with heart rate max 147 unsustained. Blood pressure decreased after Morphine given.  Pt now resting after being agitated during shift change.  Dr.Hoxworth notified of patient status and heart rate.  Orders received.

## 2014-06-13 NOTE — Progress Notes (Signed)
General Surgery Note  LOS: 4 days  POD -  4 Days Post-Op  Assessment/Plan: 1.  LAPAROSCOPIC ASSISTED OPEN VENTRAL HERNIA REPAIR, LAPAROSCOPIC LYSIS OF ADHESIONS - 06/09/2014 - D. Roberto Romanoski  Doing okay.   2.  Confusion   Possibly alcohol withdrawal related.  Tried Haldol - did not work, now on Ativan.  Arousable, but still confused to place and date.  3. HTN 4. CAD CABG - 2005 Stent placed about 2010. 08/01/2013 - Nuc myocardial perfusion - Moderate inferolateral and anterolateral defect consistent with scar and soft tissue attnenuation with minimal periinfarct ischemia. LV Ejection Fraction: 47%. LV Wall Motion: Lateral hypokinesis.  Dr. Armanda Magicraci Turner sent a note 12/12/2013 that said he is low risk for surgery. 5. Neuropathy - Both feet, etiology unclear. 6. BPH -sees Dr. Ranelle OysterJ. Wrenn  Will leave foley   I would like for his confusion to clear some. 7. GERD, but stopped meds about one year ago. 8. Heart murmur 9.  DVT prophylaxis - On SQ Heparin    Active Problems:   Ventral hernia without obstruction or gangrene  Subjective:  Sedated with ativan, but easily arousable.  Still confused.  Two women, friends who live near him are visiting. Objective:   Filed Vitals:   06/13/14 1200  BP:   Pulse:   Temp: 98.4 F (36.9 C)  Resp:      Intake/Output from previous day:  01/18 0701 - 01/19 0700 In: 2166.3 [P.O.:260; I.V.:1906.3] Out: 1995 [Urine:1875; Drains:120]  Intake/Output this shift:  Total I/O In: 100 [I.V.:100] Out: -    Physical Exam:   General: [name] who is alert.  He knows that he is in a hospital, but unsure which one.  He remembers me, but cannot name me.   HEENT: Normal. Pupils equal. .   Lungs: Clear.     Abdomen: BS present.  Seems to complain of less pain.   Wound: Drain output recorded - R/L - 120/45 cc     Lab Results:     Recent Labs  06/11/14 0402 06/12/14 0346  WBC 8.0 7.2  HGB 11.5* 11.2*  HCT  36.2* 34.5*  PLT 201 202    BMET    Recent Labs  06/11/14 0402 06/12/14 0346  NA 131* 135  K 4.3 4.2  CL 103 108  CO2 23 24  GLUCOSE 143* 120*  BUN 19 15  CREATININE 1.23 1.11  CALCIUM 8.0* 8.1*    PT/INR  No results for input(s): LABPROT, INR in the last 72 hours.  ABG  No results for input(s): PHART, HCO3 in the last 72 hours.  Invalid input(s): PCO2, PO2   Studies/Results:  No results found.   Anti-infectives:   Anti-infectives    Start     Dose/Rate Route Frequency Ordered Stop   06/09/14 0719  ceFAZolin (ANCEF) IVPB 2 g/50 mL premix     2 g100 mL/hr over 30 Minutes Intravenous On call to O.R. 06/09/14 0719 06/09/14 0930      Ovidio Kinavid Marcine Gadway, MD, FACS Pager: [phone] [facility] Office: [phone] 06/13/2014

## 2014-06-14 LAB — CBC WITH DIFFERENTIAL/PLATELET
BASOS ABS: 0 10*3/uL (ref 0.0–0.1)
BASOS PCT: 0 % (ref 0–1)
Eosinophils Absolute: 0.3 10*3/uL (ref 0.0–0.7)
Eosinophils Relative: 5 % (ref 0–5)
HEMATOCRIT: 37.3 % — AB (ref 39.0–52.0)
Hemoglobin: 11.8 g/dL — ABNORMAL LOW (ref 13.0–17.0)
LYMPHS PCT: 21 % (ref 12–46)
Lymphs Abs: 1.5 10*3/uL (ref 0.7–4.0)
MCH: 31.7 pg (ref 26.0–34.0)
MCHC: 31.6 g/dL (ref 30.0–36.0)
MCV: 100.3 fL — ABNORMAL HIGH (ref 78.0–100.0)
MONO ABS: 1 10*3/uL (ref 0.1–1.0)
Monocytes Relative: 14 % — ABNORMAL HIGH (ref 3–12)
NEUTROS ABS: 4.2 10*3/uL (ref 1.7–7.7)
Neutrophils Relative %: 60 % (ref 43–77)
PLATELETS: 247 10*3/uL (ref 150–400)
RBC: 3.72 MIL/uL — ABNORMAL LOW (ref 4.22–5.81)
RDW: 13.1 % (ref 11.5–15.5)
WBC: 7 10*3/uL (ref 4.0–10.5)

## 2014-06-14 LAB — COMPREHENSIVE METABOLIC PANEL
ALT: 34 U/L (ref 0–53)
AST: 48 U/L — ABNORMAL HIGH (ref 0–37)
Albumin: 3 g/dL — ABNORMAL LOW (ref 3.5–5.2)
Alkaline Phosphatase: 52 U/L (ref 39–117)
Anion gap: 6 (ref 5–15)
BILIRUBIN TOTAL: 1.1 mg/dL (ref 0.3–1.2)
BUN: 15 mg/dL (ref 6–23)
CHLORIDE: 108 meq/L (ref 96–112)
CO2: 24 mmol/L (ref 19–32)
Calcium: 8.3 mg/dL — ABNORMAL LOW (ref 8.4–10.5)
Creatinine, Ser: 1.02 mg/dL (ref 0.50–1.35)
GFR calc non Af Amer: 66 mL/min — ABNORMAL LOW (ref >90)
GFR, EST AFRICAN AMERICAN: 76 mL/min — AB (ref >90)
GLUCOSE: 117 mg/dL — AB (ref 70–99)
Potassium: 4 mmol/L (ref 3.5–5.1)
Sodium: 138 mmol/L (ref 135–145)
TOTAL PROTEIN: 5.6 g/dL — AB (ref 6.0–8.3)

## 2014-06-14 MED ORDER — HALOPERIDOL LACTATE 5 MG/ML IJ SOLN
5.0000 mg | Freq: Four times a day (QID) | INTRAMUSCULAR | Status: DC | PRN
  Administered 2014-06-14 – 2014-06-21 (×8): 5 mg via INTRAVENOUS
  Filled 2014-06-14 (×9): qty 1

## 2014-06-14 NOTE — Progress Notes (Signed)
Clinical Social Work Department BRIEF PSYCHOSOCIAL ASSESSMENT 06/14/2014  Patient:  Jesus [name],[name]     Account Number:  [id number]     Admit date:  06/09/2014  Clinical Social Worker:  Garlan FairKIDD,Blondell Laperle, LCSWA  Date/Time:  06/14/2014 01:28 PM  Referred by:  Physician  Date Referred:  06/14/2014 Referred for  SNF Placement   Other Referral:   Interview type:  Family Other interview type:    PSYCHOSOCIAL DATA Living Status:  ALONE Admitted from facility:   Level of care:   Primary support name:  Artelia Larocheorman Rosasco, Jr/son/[phone] Primary support relationship to patient:  CHILD, ADULT Degree of support available:   pt other son, Dominic PeaWarren Jandreau, [phone]. Both son's supportive.    CURRENT CONCERNS Current Concerns  Post-Acute Placement   Other Concerns:    SOCIAL WORK ASSESSMENT / PLAN CSW received referral that pt sons requesting to speak with CSW.    CSW received return phone call from pt son, [name] who left message as CSW was unable to answer. Pt son expressed in message that he and his brother were concerned about pt disposition plan as pt lives alone and they feel pt would benefit from short term rehab following hospitalization. Per pt son, [name], he is working today and does not have as much access to his telephone and recommended CSW to contact pt son, Sharma Covertorman to discuss.    CSW contacted pt son, Sharma Covertorman via telephone. CSW introduced self and explained role. Pt son discussed that pt had an extensive surgery and pt sons are concerned about pt ability to care for himself at home following hospitalization. Pt son, Sharma Covertorman discussed that they are hopeful that pt will qualify for short term rehab and pt son, [name] had some facilities in mind. CSW expressed understanding and discussed that CSW will leave note for MD to order therapy consults in order to evaluate pt needs and ensure [facility] CSW discussed that it is very likely that pt will qualify for short term  rehab given extensive surgery, but insurance companies have to have therapy evaluations for [facility] purposes [facility]. Pt son expressed understanding. CSW discussed that once therapy evaluations are available; that CSW can then initiate SNF search in order to explore options. Pt son appreciative of CSW support and assistance.    CSW placed sticky note to MD requesting PT/OT evaluations when pt stable to participate.    CSW to follow up on recommendations and assist with SNF as long as it is appropriate and recommended.    CSW to continue to follow to provide support and assist with disposition planning.   Assessment/plan status:  Psychosocial Support/Ongoing Assessment of Needs Other assessment/ plan:   discharge planning   Information/referral to community resources:   CSW to initiate SNF search when pt more stable and has been evaluated by PT.    PATIENT'S/FAMILY'S RESPONSE TO PLAN OF CARE: Per chart, pt oriented to person only. Pt sons supportive and actively involved in pt care. Pt son expressed that pt lives alone and at this time they do not foresee pt being able to meet his needs at home and pt sons are hopeful for short term rehab.    Loletta SpecterSuzanna Ladon Vandenberghe, MSW, LCSW Clinical Social Work [phone]

## 2014-06-14 NOTE — Progress Notes (Signed)
Around 10 pm, found patient in bed, undressed, left mitten off and right JP drain disconnected from abdomen.  Previously, patient had received Morphine and all restraints including mittens had been on.  Dr. Carolynne Edouardoth notified with orders received to increase Haldol.  No other orders received.  Continue patient restraints and monitor patient.

## 2014-06-14 NOTE — Progress Notes (Signed)
General Surgery Note  LOS: 5 days  POD -  5 Days Post-Op  Assessment/Plan: 1.  LAPAROSCOPIC ASSISTED OPEN VENTRAL HERNIA REPAIR, LAPAROSCOPIC LYSIS OF ADHESIONS - 06/09/2014 - D. Ladeja Pelham  Incision looks okay.   2.  Confusion   Possibly alcohol withdrawal related.  Tried Haldol and Ativan.  Haldol did not do much good.  Ativan made him sleep.  Still not oriented to place or time, but does verbally respond easily. Very fidgetty.  3. HTN 4. CAD CABG - 2005 Stent placed about 2010. 08/01/2013 - Nuc myocardial perfusion - Moderate inferolateral and anterolateral defect consistent with scar and soft tissue attnenuation with minimal periinfarct ischemia. LV Ejection Fraction: 47%. LV Wall Motion: Lateral hypokinesis.  Dr. Armanda Magicraci Turner sent a note 12/12/2013 that said he is low risk for surgery.  Had some tachycardias last PM.  Responded to beta blocker. 5. Neuropathy - Both feet, etiology unclear. 6. BPH -sees Dr. Ranelle OysterJ. Wrenn  Will leave foley   I would like for his confusion to clear some. 7. GERD, but stopped meds about one year ago. 8. Heart murmur 9.  DVT prophylaxis - On SQ Heparin    Active Problems:   Ventral hernia without obstruction or gangrene  Subjective:  Still confused and fidgeting.   Objective:   Filed Vitals:   06/14/14 0600  BP: 151/89  Pulse: 100  Temp:   Resp:      Intake/Output from previous day:  01/19 0701 - 01/20 0700 In: 1037.9 [I.V.:1037.9] Out: 175 [Urine:175]  Intake/Output this shift:      Physical Exam:   General: [name].  He responds to verbal questions, but is confused.   HEENT: Normal. Pupils equal. .   Lungs: Clear.     Abdomen: BS present.  Soft.   Wound: Drain output recorded - R/L - 80/40 cc     Lab Results:     Recent Labs  06/12/14 0346 06/14/14 0700  WBC 7.2 7.0  HGB 11.2* 11.8*  HCT 34.5* 37.3*  PLT 202 247    BMET    Recent Labs  06/13/14 2213 06/14/14 0356  NA 139  138  K 4.2 4.0  CL 109 108  CO2 25 24  GLUCOSE 119* 117*  BUN 15 15  CREATININE 1.13 1.02  CALCIUM 8.5 8.3*    PT/INR  No results for input(s): LABPROT, INR in the last 72 hours.  ABG  No results for input(s): PHART, HCO3 in the last 72 hours.  Invalid input(s): PCO2, PO2   Studies/Results:  No results found.   Anti-infectives:   Anti-infectives    Start     Dose/Rate Route Frequency Ordered Stop   06/09/14 0719  ceFAZolin (ANCEF) IVPB 2 g/50 mL premix     2 g100 mL/hr over 30 Minutes Intravenous On call to O.R. 06/09/14 0719 06/09/14 0930      Ovidio Kinavid Eain Mullendore, MD, FACS Pager: [phone] [facility]: [phone] 06/14/2014

## 2014-06-14 NOTE — Progress Notes (Signed)
CSW received notification that pt son's requesting to speak with CSW.  CSW visited pt room and pt neighor/friend only person present at bedside.  Per notes, pt oriented to person only.  CSW contacted pt son, Jesus [name] via telephone and left message.  CSW to await return phone call and complete full psychosocial assessment at that time.  Loletta SpecterSuzanna Rankin [name], MSW, LCSW Clinical Social Work [phone]

## 2014-06-15 MED ORDER — LORAZEPAM 1 MG PO TABS
1.0000 mg | ORAL_TABLET | Freq: Four times a day (QID) | ORAL | Status: DC | PRN
  Administered 2014-06-16: 1 mg via ORAL
  Filled 2014-06-15: qty 1

## 2014-06-15 MED ORDER — LORAZEPAM 2 MG/ML IJ SOLN
1.0000 mg | Freq: Four times a day (QID) | INTRAMUSCULAR | Status: DC | PRN
  Administered 2014-06-15: 1 mg via INTRAVENOUS

## 2014-06-15 NOTE — Progress Notes (Signed)
General Surgery Note  LOS: 6 days  POD -  6 Days Post-Op  Assessment/Plan: 1.  LAPAROSCOPIC ASSISTED OPEN VENTRAL HERNIA REPAIR, LAPAROSCOPIC LYSIS OF ADHESIONS - 06/09/2014 - D. Latrena Benegas  Seems to be doing okay  Patient pulled out right abdominal drain last PM.   2.  Confusion   Possibly alcohol withdrawal related.  Tried Haldol and Ativan.    Confused again last PM - but this AM, knows what year it is.  Many mental status turning corner.  3. HTN 4. CAD CABG - 2005 Stent placed about 2010. 08/01/2013 - Nuc myocardial perfusion - Moderate inferolateral and anterolateral defect consistent with scar and soft tissue attnenuation with minimal periinfarct ischemia. LV Ejection Fraction: 47%. LV Wall Motion: Lateral hypokinesis.  Dr. Armanda Magicraci Turner sent a note 12/12/2013 that said he is low risk for surgery.  Had some tachycardias last PM.  Responded to beta blocker. 5. Neuropathy - Both feet, etiology unclear. 6. BPH -sees Dr. Ranelle OysterJ. Wrenn  Will leave foley   I would like for his confusion to clear some. 7. GERD, but stopped meds about one year ago. 8. Heart murmur 9.  DVT prophylaxis - On SQ Heparin    Active Problems:   Ventral hernia without obstruction or gangrene  Subjective:  Still confused, but maybe a little better this AM.  Will advance diet. Objective:   Filed Vitals:   06/15/14 0600  BP: 161/86  Pulse: 101  Temp:   Resp: 18     Intake/Output from previous day:  01/20 0701 - 01/21 0700 In: 2736.7 [I.V.:2736.7] Out: 1815 [Urine:1800; Drains:15]  Intake/Output this shift:      Physical Exam:   General: [name].  He responds to verbal questions and is arousable.   HEENT: Normal. Pupils equal. .   Lungs: Clear.     Abdomen: BS present.  Soft.   Wound: Wound looked good yesterday.  Patient removwed right drain last PM.  Left drain 15 cc.    Lab Results:     Recent Labs  06/14/14 0700  WBC 7.0  HGB 11.8*  HCT 37.3*   PLT 247    BMET    Recent Labs  06/13/14 2213 06/14/14 0356  NA 139 138  K 4.2 4.0  CL 109 108  CO2 25 24  GLUCOSE 119* 117*  BUN 15 15  CREATININE 1.13 1.02  CALCIUM 8.5 8.3*    PT/INR  No results for input(s): LABPROT, INR in the last 72 hours.  ABG  No results for input(s): PHART, HCO3 in the last 72 hours.  Invalid input(s): PCO2, PO2   Studies/Results:  No results found.   Anti-infectives:   Anti-infectives    Start     Dose/Rate Route Frequency Ordered Stop   06/09/14 0719  ceFAZolin (ANCEF) IVPB 2 g/50 mL premix     2 g100 mL/hr over 30 Minutes Intravenous On call to O.R. 06/09/14 0719 06/09/14 0930      Jesus Kinavid Mylo Driskill, MD, FACS Pager: [phone] [facility] Office: [phone] 06/15/2014

## 2014-06-16 DIAGNOSIS — F10931 Alcohol use, unspecified with withdrawal delirium: Secondary | ICD-10-CM | POA: Diagnosis not present

## 2014-06-16 DIAGNOSIS — F10231 Alcohol dependence with withdrawal delirium: Secondary | ICD-10-CM | POA: Diagnosis not present

## 2014-06-16 LAB — BASIC METABOLIC PANEL
Anion gap: 8 (ref 5–15)
BUN: 10 mg/dL (ref 6–23)
CO2: 25 mmol/L (ref 19–32)
CREATININE: 1.06 mg/dL (ref 0.50–1.35)
Calcium: 8.7 mg/dL (ref 8.4–10.5)
Chloride: 108 mmol/L (ref 96–112)
GFR calc Af Amer: 73 mL/min — ABNORMAL LOW (ref >90)
GFR, EST NON AFRICAN AMERICAN: 63 mL/min — AB (ref >90)
Glucose, Bld: 111 mg/dL — ABNORMAL HIGH (ref 70–99)
POTASSIUM: 4.2 mmol/L (ref 3.5–5.1)
SODIUM: 141 mmol/L (ref 135–145)

## 2014-06-16 LAB — CBC WITH DIFFERENTIAL/PLATELET
BASOS PCT: 0 % (ref 0–1)
Basophils Absolute: 0 10*3/uL (ref 0.0–0.1)
EOS PCT: 7 % — AB (ref 0–5)
Eosinophils Absolute: 0.4 10*3/uL (ref 0.0–0.7)
HCT: 35.1 % — ABNORMAL LOW (ref 39.0–52.0)
HEMOGLOBIN: 11.4 g/dL — AB (ref 13.0–17.0)
LYMPHS ABS: 0.9 10*3/uL (ref 0.7–4.0)
Lymphocytes Relative: 14 % (ref 12–46)
MCH: 32.1 pg (ref 26.0–34.0)
MCHC: 32.5 g/dL (ref 30.0–36.0)
MCV: 98.9 fL (ref 78.0–100.0)
MONO ABS: 0.7 10*3/uL (ref 0.1–1.0)
MONOS PCT: 10 % (ref 3–12)
Neutro Abs: 4.6 10*3/uL (ref 1.7–7.7)
Neutrophils Relative %: 69 % (ref 43–77)
Platelets: 258 10*3/uL (ref 150–400)
RBC: 3.55 MIL/uL — ABNORMAL LOW (ref 4.22–5.81)
RDW: 12.7 % (ref 11.5–15.5)
WBC: 6.7 10*3/uL (ref 4.0–10.5)

## 2014-06-16 MED ORDER — LORAZEPAM 1 MG PO TABS
1.0000 mg | ORAL_TABLET | ORAL | Status: DC | PRN
  Administered 2014-06-16: 1 mg via ORAL
  Filled 2014-06-16: qty 1

## 2014-06-16 MED ORDER — LORAZEPAM 2 MG/ML IJ SOLN: 1.0000 mg | INTRAMUSCULAR | Status: DC | PRN

## 2014-06-16 MED ORDER — LORAZEPAM 2 MG/ML IJ SOLN
1.0000 mg | Freq: Four times a day (QID) | INTRAMUSCULAR | Status: AC | PRN
  Administered 2014-06-16: 1 mg via INTRAVENOUS
  Administered 2014-06-16: 2 mg via INTRAVENOUS
  Administered 2014-06-17 (×2): 3 mg via INTRAVENOUS
  Administered 2014-06-17: 2 mg via INTRAVENOUS
  Administered 2014-06-17: 3 mg via INTRAVENOUS
  Administered 2014-06-18: 2 mg via INTRAVENOUS
  Administered 2014-06-18: 3 mg via INTRAVENOUS
  Administered 2014-06-18 – 2014-06-19 (×2): 2 mg via INTRAVENOUS
  Filled 2014-06-16: qty 2
  Filled 2014-06-16 (×3): qty 1
  Filled 2014-06-16 (×3): qty 2
  Filled 2014-06-16 (×3): qty 1

## 2014-06-16 MED ORDER — LORAZEPAM 1 MG PO TABS: 1.0000 mg | ORAL_TABLET | Freq: Four times a day (QID) | ORAL | Status: AC | PRN

## 2014-06-16 MED ORDER — FOLIC ACID 1 MG PO TABS
1.0000 mg | ORAL_TABLET | Freq: Every day | ORAL | Status: DC
  Administered 2014-06-16 – 2014-06-19 (×4): 1 mg via ORAL
  Filled 2014-06-16 (×6): qty 1

## 2014-06-16 MED ORDER — THIAMINE HCL 100 MG/ML IJ SOLN
100.0000 mg | Freq: Every day | INTRAMUSCULAR | Status: DC
  Administered 2014-06-16 – 2014-06-22 (×6): 100 mg via INTRAVENOUS
  Filled 2014-06-16 (×2): qty 2
  Filled 2014-06-16 (×2): qty 1
  Filled 2014-06-16: qty 2
  Filled 2014-06-16 (×2): qty 1
  Filled 2014-06-16: qty 2

## 2014-06-16 MED ORDER — ADULT MULTIVITAMIN LIQUID CH
5.0000 mL | Freq: Every day | ORAL | Status: DC
Start: 2014-06-16 — End: 2014-06-21
  Administered 2014-06-17 – 2014-06-19 (×3): 5 mL via ORAL
  Filled 2014-06-16 (×12): qty 5

## 2014-06-16 NOTE — Progress Notes (Addendum)
General Surgery Note  LOS: 7 days  POD -  7 Days Post-Op  Assessment/Plan: 1.  LAPAROSCOPIC ASSISTED OPEN VENTRAL HERNIA REPAIR, LAPAROSCOPIC LYSIS OF ADHESIONS - 06/09/2014 - D. Jesus [name]  Seems to be doing okay  To advance diet to reg   2.  Confusion   Possibly alcohol withdrawal related.  Tried Haldol and Ativan.    Remains confused - has both haldol and Ativan ordered  I will asked hospitalist to look over patient for any additional suggestions  3. HTN 4. CAD CABG - 2005 Stent placed about 2010. 08/01/2013 - Nuc myocardial perfusion - Moderate inferolateral and anterolateral defect consistent with scar and soft tissue attnenuation with minimal periinfarct ischemia. LV Ejection Fraction: 47%. LV Wall Motion: Lateral hypokinesis.  Dr. Armanda Magicraci Turner sent a note 12/12/2013 that said he is low risk for surgery.  Had some tachycardias last PM.  Responded to beta blocker. 5. Neuropathy - Both feet, etiology unclear. 6. BPH -sees Dr. Ranelle OysterJ. Wrenn  Will leave foley   I would like for his confusion to clear some. 7. GERD, but stopped meds about one year ago. 8. Heart murmur 9.  DVT prophylaxis - On SQ Heparin    Active Problems:   Ventral hernia without obstruction or gangrene  Subjective:  Still confused.  About the same as yesterday  Will advance diet. Objective:   Filed Vitals:   06/16/14 0800  BP:   Pulse:   Temp: 99.1 F (37.3 C)  Resp:      Intake/Output from previous day:  01/21 0701 - 01/22 0700 In: 1200 [I.V.:1200] Out: 1170 [Urine:1150; Drains:20]  Intake/Output this shift:      Physical Exam:   General: [name].  He responds to verbal questions and is arousable.  But disoriented to place and time.   HEENT: Normal. Pupils equal. .   Lungs: Clear.     Abdomen: BS present.  Soft.   Wound: Wound looked good yesterday.   Left drain 20 cc.    Lab Results:     Recent Labs  06/14/14 0700 06/16/14 0920  WBC 7.0 6.7   HGB 11.8* 11.4*  HCT 37.3* 35.1*  PLT 247 258    BMET    Recent Labs  06/14/14 0356 06/16/14 0920  NA 138 141  K 4.0 4.2  CL 108 108  CO2 24 25  GLUCOSE 117* 111*  BUN 15 10  CREATININE 1.02 1.06  CALCIUM 8.3* 8.7    PT/INR  No results for input(s): LABPROT, INR in the last 72 hours.  ABG  No results for input(s): PHART, HCO3 in the last 72 hours.  Invalid input(s): PCO2, PO2   Studies/Results:  No results found.   Anti-infectives:   Anti-infectives    Start     Dose/Rate Route Frequency Ordered Stop   06/09/14 0719  ceFAZolin (ANCEF) IVPB 2 g/50 mL premix     2 g100 mL/hr over 30 Minutes Intravenous On call to O.R. 06/09/14 0719 06/09/14 0930      Ovidio Kinavid Sheyanne Munley, MD, FACS Pager: [phone] [facility] Office: [phone] 06/16/2014

## 2014-06-16 NOTE — Consult Note (Signed)
[facility] Medical Consultation  Jesus [name] ZOX:[id number] DOB: Mar 26, 1929 DOA: 06/09/2014 PCP: Miguel Aschoff, MD   Requesting physician: Dr. Ezzard Standing Date of consultation: 06/16/14 Reason for consultation: Medical management and alcohol withdrawal  Impression/Recommendations Principal Problem:   Alcohol withdrawal delirium - We'll place on Cipro protocol - Increase amount of Ativan that can be given within a 6 hour period - Reassess next a.m. Reportedly patient drinks scotch every day  Active Problems:    Essential hypertension, benign -At this point has fluctuated from normotensive values to uncontrolled values. I suspect most as this is secondary to alcohol withdrawal as such will assess blood pressures after patient has had adequate Ativan administration.    Hyperlipidemia - After discharge may continue home medication regimen.    Coronary artery disease - stable no chest pain    Ventral hernia without obstruction or gangrene - Management per primary team  Chief Complaint: 81 year old with confusion and altered mental status  HPI:  Patient is an 81 year old who presents to the hospital for elective repair of ventral incisional hernia. Reportedly patient drinks alcohol every day. After cessation of alcohol patient developed worsening confusion and currently is presenting with altered mental status. We have been consulted by general surgery for further evaluation recommendations secondary to altered mental status presumed to be from alcohol withdrawal.  Review of Systems:  Unable to assess secondary to confusion  Past Medical History  Diagnosis Date  . Aortic stenosis, mild   . Dyslipidemia   . Coronary artery disease     CABG 2005, repeat cath with severe 2 vessel ASCAD with high grade stenosis of LAD, patent LIMA to LAD, patent SVG too diag, mildly atretic but widely patent SVG to RCA and high grade stenosis 90% ostial left circ with left dominant with PDA coming off  of the left circ s/p rotational atherectomy and cutting balloon PCI with stent to prox left circ  . Heart murmur dx'd 1948  . History of blood transfusion     "related to OR"  . GERD (gastroesophageal reflux disease)   . Duodenal ulcer 1949  . Arthritis     "fingers" (12/14/2013)  . Cellulitis of foot, left 12/13/2013  . Myocardial infarction     age 60   . Neuromuscular disorder     neuropathy left foot  . Nocturia   . Frequency   . Enlarged prostate   . Ventral hernia    Past Surgical History  Procedure Laterality Date  . Prostate biopsy    . Cholecystectomy  1972  . Abdominal exploration surgery    . Cataract extraction w/ intraocular lens  implant, bilateral Bilateral   . Appendectomy    . Coronary angioplasty with stent placement  12/18/2011    "1"  . Tonsillectomy    . Percutaneous coronary stent intervention (pci-s) N/A 12/18/2011    Procedure: PERCUTANEOUS CORONARY STENT INTERVENTION (PCI-S);  Surgeon: Corky Crafts, MD;  Location[facility] CATH LAB;  Service: Cardiovascular;  Laterality: N/A;  . Carotid endarterectomy      rt side  . Coronary artery bypass graft  2003    "CABG X3"  . Ventral hernia repair N/A 06/09/2014    Procedure: LAPAROSCOPIC ASSISTED OPEN VENTRAL HERNIA REPAIR;  Surgeon: Ovidio Kin, MD;  Location: [facility] ORS;  Service: General;  Laterality: N/A;  WITH MESH  . Laparoscopic lysis of adhesions N/A 06/09/2014    Procedure: LAPAROSCOPIC LYSIS OF ADHESIONS;  Surgeon: Ovidio Kin, MD;  Location: [facility] ORS;  Service: General;  Laterality:  N/A;   Social History:  reports that he quit smoking about 35 years ago. His smoking use included Cigarettes. He has a .24 pack-year smoking history. He has never used smokeless tobacco. He reports that he drinks about 2.4 oz of alcohol per week. He reports that he does not use illicit drugs.  Allergies  Allergen Reactions  . Cardura [Doxazosin Mesylate]     Hypotenstion  . Gabapentin     Weight gain   Family History   Problem Relation Age of Onset  . Heart attack Father   . Arrhythmia Mother     Prior to Admission medications   Medication Sig Start Date End Date Taking? Authorizing Provider  alfuzosin (UROXATRAL) 10 MG 24 hr tablet Take 10 mg by mouth daily with breakfast.  01/10/14  Yes Historical Provider, MD  aspirin EC 81 MG tablet Take 1 tablet (81 mg total) by mouth daily. 07/13/13  Yes Quintella Reichert, MD  B Complex-C (B-COMPLEX WITH VITAMIN C) tablet Take 1 tablet by mouth daily.   Yes Historical Provider, MD  ibuprofen (ADVIL,MOTRIN) 200 MG tablet Take 600 mg by mouth every 6 (six) hours as needed for mild pain or moderate pain.   Yes Historical Provider, MD  Multiple Vitamins-Minerals (CENTRUM SILVER PO) Take 1 tablet by mouth daily.   Yes Historical Provider, MD  pantoprazole (PROTONIX) 40 MG tablet Take 40 mg by mouth daily.   Yes Historical Provider, MD  triamcinolone cream (KENALOG) 0.1 % Apply 1 application topically 2 (two) times daily.   Yes Historical Provider, MD  doxycycline (VIBRAMYCIN) 100 MG capsule Take 1 capsule (100 mg total) by mouth 2 (two) times daily. Patient not taking: Reported on 06/07/2014 12/15/13   Kela Millin, MD  oxyCODONE (OXY IR/ROXICODONE) 5 MG immediate release tablet Take 1 tablet (5 mg total) by mouth every 6 (six) hours as needed for severe pain (moderate pain). Patient not taking: Reported on 06/07/2014 12/15/13   Kela Millin, MD   Physical Exam: Blood pressure 148/77, pulse 106, temperature 98.4 F (36.9 C), temperature source Axillary, resp. rate 23, height  (1.778 m), weight 111.4 kg (245 lb 9.5 oz), SpO2 98 %. Filed Vitals:   06/16/14 1200  BP: 148/77  Pulse: 106  Temp: 98.4 F (36.9 C)  Resp: 23     General:  Patient in no acute distress, alert and awake  Eyes: Extraocular movements intact, nonicteric  ENT: Normal exterior appearance  Neck: Supple no goiter  Cardiovascular: Regular rate and rhythm, no murmurs or  rubs  Respiratory: Clear to auscultation bilaterally, no wheezes  Abdomen: Soft, obese, nontender  Skin: No obvious skin rash on limited exam  Musculoskeletal: No cyanosis or clubbing  Psychiatric: Unable to assess secondary to confusion  Neurologic: Patient confused but moves extremities equally and no facial asymmetry  Labs on Admission:  Basic Metabolic Panel:  Recent Labs Lab 06/11/14 0402 06/12/14 0346 06/13/14 2213 06/14/14 0356 06/16/14 0920  NA 131* 135 139 138 141  K 4.3 4.2 4.2 4.0 4.2  CL 103 108 109 108 108  CO2 GLUCOSE 143* 120* 119* 117* 111*  BUN CREATININE 1.23 1.11 1.13 1.02 1.06  CALCIUM 8.0* 8.1* 8.5 8.3* 8.7  MG  --   --  1.8  --   --    Liver Function Tests:  Recent Labs Lab 06/14/14 0356  AST 48*  ALT 34  ALKPHOS 52  BILITOT  1.1  PROT 5.6*  ALBUMIN 3.0*   No results for input(s): LIPASE, AMYLASE in the last 168 hours. No results for input(s): AMMONIA in the last 168 hours. CBC:  Recent Labs Lab 06/10/14 0405 06/11/14 0402 06/12/14 0346 06/14/14 0700 06/16/14 0920  WBC 8.1 8.0 7.2 7.0 6.7  NEUTROABS  --  6.5 5.6 4.2 4.6  HGB 12.5* 11.5* 11.2* 11.8* 11.4*  HCT 39.4 36.2* 34.5* 37.3* 35.1*  MCV 100.0 100.8* 100.0 100.3* 98.9  PLT 209 201 202 247 258   Cardiac Enzymes: No results for input(s): CKTOTAL, CKMB, CKMBINDEX, TROPONINI in the last 168 hours. BNP: Invalid input(s): POCBNP CBG: No results for input(s): GLUCAP in the last 168 hours.  Radiological Exams on Admission: No results found.   Time spent: 30 minutes  Penny PiaVEGA, Delitha Elms [facility] Pager [phone]  If 7PM-7AM, please contact night-coverage [email or web address] Password [email or web address] 06/16/2014, 1:15 PM

## 2014-06-17 DIAGNOSIS — I4729 Other ventricular tachycardia: Secondary | ICD-10-CM

## 2014-06-17 DIAGNOSIS — I472 Ventricular tachycardia: Secondary | ICD-10-CM

## 2014-06-17 LAB — MAGNESIUM: Magnesium: 1.6 mg/dL (ref 1.5–2.5)

## 2014-06-17 LAB — PHOSPHORUS: PHOSPHORUS: 3.6 mg/dL (ref 2.3–4.6)

## 2014-06-17 LAB — CBC WITH DIFFERENTIAL/PLATELET
Basophils Absolute: 0 10*3/uL (ref 0.0–0.1)
Basophils Relative: 0 % (ref 0–1)
EOS PCT: 4 % (ref 0–5)
Eosinophils Absolute: 0.3 10*3/uL (ref 0.0–0.7)
HEMATOCRIT: 34 % — AB (ref 39.0–52.0)
HEMOGLOBIN: 10.9 g/dL — AB (ref 13.0–17.0)
Lymphocytes Relative: 15 % (ref 12–46)
Lymphs Abs: 0.9 10*3/uL (ref 0.7–4.0)
MCH: 31.8 pg (ref 26.0–34.0)
MCHC: 32.1 g/dL (ref 30.0–36.0)
MCV: 99.1 fL (ref 78.0–100.0)
MONO ABS: 0.8 10*3/uL (ref 0.1–1.0)
MONOS PCT: 12 % (ref 3–12)
NEUTROS ABS: 4.4 10*3/uL (ref 1.7–7.7)
NEUTROS PCT: 69 % (ref 43–77)
PLATELETS: 285 10*3/uL (ref 150–400)
RBC: 3.43 MIL/uL — ABNORMAL LOW (ref 4.22–5.81)
RDW: 13 % (ref 11.5–15.5)
WBC: 6.4 10*3/uL (ref 4.0–10.5)

## 2014-06-17 MED ORDER — METOPROLOL TARTRATE 12.5 MG HALF TABLET
12.5000 mg | ORAL_TABLET | Freq: Two times a day (BID) | ORAL | Status: DC
  Administered 2014-06-17 – 2014-06-20 (×8): 12.5 mg via ORAL
  Filled 2014-06-17 (×11): qty 1

## 2014-06-17 NOTE — Progress Notes (Signed)
[facility] PROGRESS NOTE  Otho Perlorman F Schaad WUJ:[id number] DOB: 02/28/1929 DOA: 06/09/2014 PCP: Miguel AschoffOSS,ALLAN, MD  Assessment/Plan: Principal Problem:  Alcohol withdrawal delirium - Continue on CIWA protocol - Increase amount of Ativan that can be given within a 6 hour period - Reassess next a.m. Reportedly patient drinks scotch every day  Non sustained V tach - overnight reportedly for 8 beats, patient asymptomatic - Will assess bmp, magnesium, and phosphorus and replace as necessary - continue telemetry monitoring - Add low dose B blocker  Active Problems:   Essential hypertension, benign -Add low dose beta blocker - most likely exacerbated 2ary to alcohol withdrawal   Hyperlipidemia - After discharge may continue home medication regimen.   Coronary artery disease - stable no chest pain   Ventral hernia without obstruction or gangrene - Management per primary team   Code Status: full Family Communication: none at bedside Disposition Plan: Pending improvement in condition.   Procedures:  Please refer to Gen surgery notes  Antibiotics:  None  HPI/Subjective: Pt resting comfortably. Arousable.   Objective: Filed Vitals:   06/17/14 0737  BP:   Pulse: 111  Temp: 98.5 F (36.9 C)  Resp: 22    Intake/Output Summary (Last 24 hours) at 06/17/14 1022 Last data filed at 06/17/14 0700  Gross per 24 hour  Intake   1625 ml  Output   3261 ml  Net  -1636 ml   Filed Weights   06/13/14 0400 06/14/14 0400 06/16/14 0418  Weight: 114.488 kg (252 lb 6.4 oz) 115.6 kg (254 lb 13.6 oz) 111.4 kg (245 lb 9.5 oz)    Exam:   General:  Pt in nad, alert and awake  Cardiovascular: rrr, no mrg  Respiratory: cta bl, no wheezes  Abdomen: soft, ND  Musculoskeletal: no cyanosis or clubbing on limited exam   Data Reviewed: Basic Metabolic Panel:  Recent Labs Lab 06/11/14 0402 06/12/14 0346 06/13/14 2213 06/14/14 0356 06/16/14 0920  NA 131* 135 139 138 141   K 4.3 4.2 4.2 4.0 4.2  CL 103 108 109 108 108  CO2 23 24 25 24 25   GLUCOSE 143* 120* 119* 117* 111*  BUN 19 15 15 15 10   CREATININE 1.23 1.11 1.13 1.02 1.06  CALCIUM 8.0* 8.1* 8.5 8.3* 8.7  MG  --   --  1.8  --   --    Liver Function Tests:  Recent Labs Lab 06/14/14 0356  AST 48*  ALT 34  ALKPHOS 52  BILITOT 1.1  PROT 5.6*  ALBUMIN 3.0*   No results for input(s): LIPASE, AMYLASE in the last 168 hours. No results for input(s): AMMONIA in the last 168 hours. CBC:  Recent Labs Lab 06/11/14 0402 06/12/14 0346 06/14/14 0700 06/16/14 0920  WBC 8.0 7.2 7.0 6.7  NEUTROABS 6.5 5.6 4.2 4.6  HGB 11.5* 11.2* 11.8* 11.4*  HCT 36.2* 34.5* 37.3* 35.1*  MCV 100.8* 100.0 100.3* 98.9  PLT 201 202 247 258   Cardiac Enzymes: No results for input(s): CKTOTAL, CKMB, CKMBINDEX, TROPONINI in the last 168 hours. BNP (last 3 results) No results for input(s): PROBNP in the last 8760 hours. CBG: No results for input(s): GLUCAP in the last 168 hours.  No results found for this or any previous visit (from the past 240 hour(s)).   Studies: No results found.  Scheduled Meds: . antiseptic oral rinse  7 mL Mouth Rinse q12n4p  . chlorhexidine  15 mL Mouth Rinse BID  . folic acid  1 mg Oral Daily  .  heparin subcutaneous  5,000 Units Subcutaneous 3 times per day  . metoprolol tartrate  12.5 mg Oral BID  . multivitamin  5 mL Oral Daily  . thiamine IV  100 mg Intravenous Daily   Continuous Infusions: . dextrose 5 % and 0.45 % NaCl with KCl 20 mEq/L 75 mL/hr (06/17/14 0803)    Principal Problem:   Alcohol withdrawal delirium Active Problems:   Essential hypertension, benign   Hyperlipidemia   Coronary artery disease   Ventral hernia without obstruction or gangrene  Time spent: > 35 minutes    Penny Pia  [facility] Pager [phone]. If 7PM-7AM, please contact night-coverage at [email or web address], password [facility] 06/17/2014, 10:22 AM  LOS: 8 days

## 2014-06-17 NOTE — Progress Notes (Signed)
Patient ID: Jesus [name], male   DOB: 09/15/1928, 81 y.o.   MRN: [id number] General Surgery Note  LOS: 8 days  POD -  8 Days Post-Op  Assessment/Plan: 1.  LAPAROSCOPIC ASSISTED OPEN VENTRAL HERNIA REPAIR, LAPAROSCOPIC LYSIS OF ADHESIONS - 06/09/2014 - D. Newman  Seems to be doing okay     2.  Confusion   Possibly alcohol withdrawal related.  Tried Haldol and Ativan.    Remains intermittently confused.    3. HTN 4. CAD CABG - 2005 Stent placed about 2010. 08/01/2013 - Nuc myocardial perfusion - Moderate inferolateral and anterolateral defect consistent with scar and soft tissue attnenuation with minimal periinfarct ischemia. LV Ejection Fraction: 47%. LV Wall Motion: Lateral hypokinesis.  Dr. Armanda Magicraci Turner sent a note 12/12/2013 that said he is low risk for surgery.  Had some tachycardias last PM.  Responded to beta blocker. 5. Neuropathy - Both feet, etiology unclear. 6. BPH -sees Dr. Ranelle OysterJ. Wrenn  Will leave foley   I would like for his confusion to clear some. 7. GERD, but stopped meds about one year ago. 8. Heart murmur 9.  DVT prophylaxis - On SQ Heparin    Principal Problem:   Alcohol withdrawal delirium Active Problems:   Essential hypertension, benign   Hyperlipidemia   Coronary artery disease   Ventral hernia without obstruction or gangrene  Subjective:  Still confused, but maybe a little better this AM.  Will advance diet. Objective:   Filed Vitals:   06/17/14 0737  BP:   Pulse: 111  Temp: 98.5 F (36.9 C)  Resp: 22     Intake/Output from previous day:  01/22 0701 - 01/23 0700 In: 1925 [I.V.:1925] Out: 3261 [Urine:3260; Stool:1]  Intake/Output this shift:      Physical Exam:   General: [name].  Would not answer questions for me today.     HEENT: Normal. Pupils equal. .   Lungs: Clear.     Abdomen: BS present.  Soft.   Wound: Wound c/d/i.  Old blood in left drain..    Lab Results:     Recent Labs  06/16/14 0920  WBC 6.7  HGB 11.4*  HCT 35.1*  PLT 258    BMET    Recent Labs  06/16/14 0920  NA 141  K 4.2  CL 108  CO2 25  GLUCOSE 111*  BUN 10  CREATININE 1.06  CALCIUM 8.7    PT/INR  No results for input(s): LABPROT, INR in the last 72 hours.  ABG  No results for input(s): PHART, HCO3 in the last 72 hours.  Invalid input(s): PCO2, PO2   Studies/Results:  No results found.   Anti-infectives:   Anti-infectives    Start     Dose/Rate Route Frequency Ordered Stop   06/09/14 0719  ceFAZolin (ANCEF) IVPB 2 g/50 mL premix     2 g100 mL/hr over 30 Minutes Intravenous On call to O.R. 06/09/14 0719 06/09/14 0930      [facility] Office: [phone] 06/17/2014

## 2014-06-18 LAB — BASIC METABOLIC PANEL
ANION GAP: 5 (ref 5–15)
BUN: 10 mg/dL (ref 6–23)
CALCIUM: 8.5 mg/dL (ref 8.4–10.5)
CO2: 29 mmol/L (ref 19–32)
CREATININE: 1.2 mg/dL (ref 0.50–1.35)
Chloride: 109 mmol/L (ref 96–112)
GFR calc Af Amer: 63 mL/min — ABNORMAL LOW (ref >90)
GFR calc non Af Amer: 54 mL/min — ABNORMAL LOW (ref >90)
GLUCOSE: 126 mg/dL — AB (ref 70–99)
Potassium: 4.3 mmol/L (ref 3.5–5.1)
Sodium: 143 mmol/L (ref 135–145)

## 2014-06-18 LAB — FOLATE: Folate: >20 ng/mL

## 2014-06-18 LAB — VITAMIN B12: Vitamin B-12: 1256 pg/mL — ABNORMAL HIGH (ref 211–911)

## 2014-06-18 LAB — TSH: TSH: 0.916 u[IU]/mL (ref 0.350–4.500)

## 2014-06-18 NOTE — Progress Notes (Signed)
[facility] PROGRESS NOTE  Jesus [name] ZOX:[id number] DOB: 1929-04-27 DOA: 06/09/2014 PCP: Miguel Aschoff, MD  Assessment/Plan: Principal Problem:  Alcohol withdrawal delirium - Continue on CIWA protocol - Increase amount of Ativan that can be given within a 6 hour period - Reassess next a.m. Reportedly patient drinks scotch every day -Still confused. Will obtain routine medical work up: TSH, Folate, Vitamin B 12, HIV. If work up negative and no improvement in mentation will plan on consulting psychiatry  Non sustained V tach - resolved - Added low dose B blocker  Active Problems:   Essential hypertension, benign -Added low dose beta blocker - Blood pressures continue to fluctuate from low normal to 170/65 (on last check) will hold off on increasing antihypertensive medication. - most likely exacerbated 2ary to alcohol withdrawal   Hyperlipidemia - After discharge may continue home medication regimen.   Coronary artery disease - stable no chest pain   Ventral hernia without obstruction or gangrene - Management per primary team   Code Status: full Family Communication: none at bedside Disposition Plan: Pending improvement in condition.   Procedures:  Please refer to Gen surgery notes  Antibiotics:  None  HPI/Subjective: Pt arousable but still confused  Objective: Filed Vitals:   06/18/14 0800  BP:   Pulse:   Temp: 97.5 F (36.4 C)  Resp:     Intake/Output Summary (Last 24 hours) at 06/18/14 0951 Last data filed at 06/18/14 0757  Gross per 24 hour  Intake 1721.25 ml  Output   2200 ml  Net -478.75 ml   Filed Weights   06/14/14 0400 06/16/14 0418 06/18/14 0500  Weight: 115.6 kg (254 lb 13.6 oz) 111.4 kg (245 lb 9.5 oz) 111.358 kg (245 lb 8 oz)    Exam:   General:  Pt in nad, alert and awake, not oriented  Cardiovascular: rrr, no mrg  Respiratory: cta bl, no wheezes  Abdomen: soft, ND  Musculoskeletal: no cyanosis or clubbing on  limited exam   Data Reviewed: Basic Metabolic Panel:  Recent Labs Lab 06/12/14 0346 06/13/14 2213 06/14/14 0356 06/16/14 0920 06/17/14 1038 06/18/14 0347  NA 135 139 138 141  --  143  K 4.2 4.2 4.0 4.2  --  4.3  CL 108 109 108 108  --  109  CO2 --  29  GLUCOSE 120* 119* 117* 111*  --  126*  BUN --  10  CREATININE 1.11 1.13 1.02 1.06  --  1.20  CALCIUM 8.1* 8.5 8.3* 8.7  --  8.5  MG  --  1.8  --   --  1.6  --   PHOS  --   --   --   --  3.6  --    Liver Function Tests:  Recent Labs Lab 06/14/14 0356  AST 48*  ALT 34  ALKPHOS 52  BILITOT 1.1  PROT 5.6*  ALBUMIN 3.0*   No results for input(s): LIPASE, AMYLASE in the last 168 hours. No results for input(s): AMMONIA in the last 168 hours. CBC:  Recent Labs Lab 06/12/14 0346 06/14/14 0700 06/16/14 0920 06/17/14 1038  WBC 7.2 7.0 6.7 6.4  NEUTROABS 5.6 4.2 4.6 4.4  HGB 11.2* 11.8* 11.4* 10.9*  HCT 34.5* 37.3* 35.1* 34.0*  MCV 100.0 100.3* 98.9 99.1  PLT 202 247 258 285   Cardiac Enzymes: No results for input(s): CKTOTAL, CKMB, CKMBINDEX, TROPONINI in the last 168 hours. BNP (last 3 results) No results  for input(s): PROBNP in the last 8760 hours. CBG: No results for input(s): GLUCAP in the last 168 hours.  No results found for this or any previous visit (from the past 240 hour(s)).   Studies: No results found.  Scheduled Meds: . antiseptic oral rinse  7 mL Mouth Rinse q12n4p  . chlorhexidine  15 mL Mouth Rinse BID  . folic acid  1 mg Oral Daily  . heparin subcutaneous  5,000 Units Subcutaneous 3 times per day  . metoprolol tartrate  12.5 mg Oral BID  . multivitamin  5 mL Oral Daily  . thiamine IV  100 mg Intravenous Daily   Continuous Infusions: . dextrose 5 % and 0.45 % NaCl with KCl 20 mEq/L 75 mL/hr at 06/18/14 0154    Principal Problem:   Alcohol withdrawal delirium Active Problems:   Essential hypertension, benign   Hyperlipidemia   Coronary artery disease    Ventral hernia without obstruction or gangrene  Time spent: > 35 minutes    Penny PiaVEGA, Tiarra Anastacio  [facility] Pager [phone]. If 7PM-7AM, please contact night-coverage at [email or web address], password [email or web address] 06/18/2014, 9:51 AM  LOS: 9 days

## 2014-06-18 NOTE — Progress Notes (Signed)
Attempted to sit patient at side of bed with 2 assist.  Pt conversational and alert, yet unable to support his body weight and hold up self independently to sit at bedside.

## 2014-06-18 NOTE — Progress Notes (Signed)
Patient ID: Jesus [name], male   DOB: 03/30/1929, 81 y.o.   MRN: [id number] General Surgery Note  LOS: 9 days  POD -  9 Days Post-Op  Assessment/Plan: 1.  LAPAROSCOPIC ASSISTED OPEN VENTRAL HERNIA REPAIR, LAPAROSCOPIC LYSIS OF ADHESIONS - 06/09/2014 - D. Newman  Seems to be doing okay     2.  Confusion   Possibly alcohol withdrawal related.  Tried Haldol and Ativan.    Remains intermittently confused.    3. HTN 4. CAD CABG - 2005 Stent placed about 2010. 08/01/2013 - Nuc myocardial perfusion - Moderate inferolateral and anterolateral defect consistent with scar and soft tissue attnenuation with minimal periinfarct ischemia. LV Ejection Fraction: 47%. LV Wall Motion: Lateral hypokinesis.  Dr. Armanda Magicraci Turner sent a note 12/12/2013 that said he is low risk for surgery.  Had some tachycardias last PM.  Responded to beta blocker. 5. Neuropathy - Both feet, etiology unclear. 6. BPH -sees Dr. Ranelle OysterJ. Wrenn  Will leave foley for now  7. GERD, but stopped meds about one year ago. 8. Heart murmur 9.  DVT prophylaxis - On SQ Heparin    Principal Problem:   Alcohol withdrawal delirium Active Problems:   Essential hypertension, benign   Hyperlipidemia   Coronary artery disease   Ventral hernia without obstruction or gangrene   Non-sustained ventricular tachycardia  Subjective:  Still confused, spoke to me this AM, but did not know what was going on or where he was.  Will advance diet.  Objective:   Filed Vitals:   06/18/14 0800  BP:   Pulse:   Temp: 97.5 F (36.4 C)  Resp:      Intake/Output from previous day:  01/23 0701 - 01/24 0700 In: 1860 [P.O.:60; I.V.:1800] Out: 1900 [Urine:1890; Drains:10]  Intake/Output this shift:  Total I/O In: 71.3 [I.V.:71.3] Out: 300 [Urine:300]   Physical Exam:   General: [name].   Marland Kitchen.   Lungs: breathing comfortably   Abdomen: BS present.  Soft.   Wound: Wound c/d/i.  Old blood in left  drain.    Lab Results:     Recent Labs  06/16/14 0920 06/17/14 1038  WBC 6.7 6.4  HGB 11.4* 10.9*  HCT 35.1* 34.0*  PLT 258 285    BMET    Recent Labs  06/16/14 0920 06/18/14 0347  NA 141 143  K 4.2 4.3  CL 108 109  CO2 25 29  GLUCOSE 111* 126*  BUN 10 10  CREATININE 1.06 1.20  CALCIUM 8.7 8.5    PT/INR  No results for input(s): LABPROT, INR in the last 72 hours.  ABG  No results for input(s): PHART, HCO3 in the last 72 hours.  Invalid input(s): PCO2, PO2   Studies/Results:  No results found.   Anti-infectives:   Anti-infectives    Start     Dose/Rate Route Frequency Ordered Stop   06/09/14 0719  ceFAZolin (ANCEF) IVPB 2 g/50 mL premix     2 g100 mL/hr over 30 Minutes Intravenous On call to O.R. 06/09/14 0719 06/09/14 0930      [facility] [place] [facility] Office: [phone] 06/18/2014

## 2014-06-19 LAB — BASIC METABOLIC PANEL
Anion gap: 8 (ref 5–15)
BUN: 9 mg/dL (ref 6–23)
CO2: 27 mmol/L (ref 19–32)
Calcium: 8.7 mg/dL (ref 8.4–10.5)
Chloride: 105 mmol/L (ref 96–112)
Creatinine, Ser: 1.12 mg/dL (ref 0.50–1.35)
GFR calc non Af Amer: 59 mL/min — ABNORMAL LOW (ref >90)
GFR, EST AFRICAN AMERICAN: 68 mL/min — AB (ref >90)
Glucose, Bld: 109 mg/dL — ABNORMAL HIGH (ref 70–99)
Potassium: 4 mmol/L (ref 3.5–5.1)
Sodium: 140 mmol/L (ref 135–145)

## 2014-06-19 LAB — RPR: RPR: NONREACTIVE

## 2014-06-19 LAB — HIV ANTIBODY (ROUTINE TESTING W REFLEX)
HIV 1/O/2 Abs-Index Value: <1 (ref <1.00)
HIV-1/HIV-2 Ab: NONREACTIVE

## 2014-06-19 NOTE — Progress Notes (Signed)
General Surgery Note  LOS: 10 days  POD -  10 Days Post-Op  Assessment/Plan: 1.  LAPAROSCOPIC ASSISTED OPEN VENTRAL HERNIA REPAIR, LAPAROSCOPIC LYSIS OF ADHESIONS - 06/09/2014 - Jesus [name]  Incision looks okay.  To remove single staples today.   2.  Confusion   Possibly alcohol withdrawal related.  On CIWA protocol.  Appreciate assistance from hospitalist.  Family concerned the meds are causing his confusion, but it probably has little to do with it.  Jesus [name] in room raising concerns, but not sure how well she understands situation.  3. HTN 4. CAD CABG - 2005 Stent placed about 2010. 08/01/2013 - Nuc myocardial perfusion - Moderate inferolateral and anterolateral defect consistent with scar and soft tissue attnenuation with minimal periinfarct ischemia. LV Ejection Fraction: 47%. LV Wall Motion: Lateral hypokinesis.  Jesus [name] sent a note 12/12/2013 that said he is low risk for surgery.  Had some tachycardias last PM.  Responded to beta blocker. 5. Neuropathy - Both feet, etiology unclear. 6. BPH -sees Jesus [name]  Will leave foley   I would like for his confusion to clear. 7. GERD, but stopped meds about one year ago. 8. Heart murmur 9.  DVT prophylaxis - On SQ Heparin    Principal Problem:   Alcohol withdrawal delirium Active Problems:   Essential hypertension, benign   Hyperlipidemia   Coronary artery disease   Ventral hernia without obstruction or gangrene   Non-sustained ventricular tachycardia  Subjective:  Stll confused, but opens eyes and carries on conversation - though the conversation wanders.  Jesus GettingFriend, Jesus [name], in room with patient.  He recognizes her, but not oriented to place or time.  Objective:   Filed Vitals:   06/19/14 0845  BP: 154/90  Pulse: 81  Temp:   Resp: 18     Intake/Output from previous day:  01/24 0701 - 01/25 0700 In: 1395 [P.O.:120; I.V.:1275] Out: 1606 [Urine:1600; Drains:5;  Stool:1]  Intake/Output this shift:  Total I/O In: 675 [I.V.:675] Out: 250 [Urine:250]   Physical Exam:   General: [name].  He responds to verbal questions and is arousable. He looks better than last week. But disoriented to place and time.   HEENT: Normal. Pupils equal. .   Lungs: Clear.     Abdomen: BS present.  Soft.   Wound: Wound looked good yesterday.   Left drain 5 cc.    Lab Results:  TSH, Vit B12, Folate, Mg, Phosphorus - all normal     Recent Labs  06/17/14 1038  WBC 6.4  HGB 10.9*  HCT 34.0*  PLT 285    BMET    Recent Labs  06/18/14 0347 06/19/14 0410  NA 143 140  K 4.3 4.0  CL 109 105  CO2 29 27  GLUCOSE 126* 109*  BUN 10 9  CREATININE 1.20 1.12  CALCIUM 8.5 8.7    PT/INR  No results for input(s): LABPROT, INR in the last 72 hours.  ABG  No results for input(s): PHART, HCO3 in the last 72 hours.  Invalid input(s): PCO2, PO2   Studies/Results:  No results found.   Anti-infectives:   Anti-infectives    Start     Dose/Rate Route Frequency Ordered Stop   06/09/14 0719  ceFAZolin (ANCEF) IVPB 2 g/50 mL premix     2 g100 mL/hr over 30 Minutes Intravenous On call to O.R. 06/09/14 0719 06/09/14 0930      Jesus Kinavid Erinn Huskins, MD, FACS Pager: [phone] [facility] Office: [phone]  06/19/2014   

## 2014-06-19 NOTE — Progress Notes (Signed)
CARE MANAGEMENT NOTE 06/19/2014  Patient:  Otho PerlLANG,Ojani F   Account Number:  [id number]  Date Initiated:  06/13/2014  Documentation initiated by:  Lorenda IshiharaPEELE,SUZANNE  Subjective/Objective Assessment:   81 yo male admitted to [facility] s/p open ventral hernia repair and lysis of adhesions. PTA lived at home alone.     Action/Plan:   Home vs SNF   Anticipated DC Date:  06/22/2014   Anticipated DC Plan:  HOME/SELF CARE  In-house referral  Clinical Social Worker      DC Planning Services  CM consult      Choice offered to / List presented to:             Status of service:  In process, will continue to follow Medicare Important Message given?   (If response is "NO", the following Medicare IM given date fields will be blank) Date Medicare IM given:   Medicare IM given by:   Date Additional Medicare IM given:   Additional Medicare IM given by:    Discharge Disposition:    Per UR Regulation:  Reviewed for med. necessity/level of care/duration of stay  If discussed at Long Length of Stay Meetings, dates discussed:    Comments:  [id number]/Rhonda Davis,RN,BSN,CCM: aptient continues to have etoh w/d and confusion, dressing changes on going,

## 2014-06-19 NOTE — Consult Note (Signed)
[facility] Face-to-Face Psychiatry Consult   Reason for Consult:  Alcohol withdrawal delirium Referring Physician:  Dr. Ezzard Standing and Dr. Cena Benton  Patient Identification: Jesus [name] MRN:  [id number] Principal Diagnosis: Alcohol withdrawal delirium Diagnosis:   Patient Active Problem List   Diagnosis Date Noted  . Non-sustained ventricular tachycardia [I47.2] 06/17/2014  . Alcohol withdrawal delirium [F10.231] 06/16/2014  . Ventral hernia without obstruction or gangrene [K43.9] 06/09/2014  . Cellulitis of foot, left [L03.116] 12/13/2013  . Cellulitis [L03.90] 12/13/2013  . Incisional hernia, without obstruction or gangrene [K43.2] 11/15/2013  . Pain in lower limb [M79.606] 07/15/2013  . Aortic stenosis [I35.0] 07/13/2013  . Coronary artery disease [I25.10]   . Coronary atherosclerosis of native coronary artery [I25.10] 07/05/2013  . Essential hypertension, benign [I10] 07/05/2013  . Hyperlipidemia [E78.5] 07/05/2013  . Onychomycosis [B35.1] 02/11/2013  . Ingrown nail [L60.0] 11/12/2012  . Paronychia of great toe of left foot [L03.032] 11/12/2012  . Pain in toe of left foot [M79.675] 11/12/2012    Total Time spent with patient: 45 minutes  Subjective:   Jesus [name] is a 81 y.o. male patient admitted with delirium.  HPI:  Jesus [name] is a 81 year old male seen and chart reviewed for psychiatric consultation and evaluation. Patient family friends for at bedside who is able to contribute to this evaluation. Patient reports drinking regularly alcohol but denied history of withdrawal and cravings. Patient has cognitive decline due to multiple medication while in the hospital as per the family friends. Patient reportedly had a head near repair. Patient was found to be somewhat confused, forgetful and memory difficulties. Patient has no history of dementia or delirium before going to the surgery. Patient has responded fairly to most of the questions related to orientation, concentration but has  difficulty with the memory. Patient son Jesus [name] is not at bedside during my visit. Reportedly patient son has a plans about moving in with his father if needed after discharge from the hospital. If not patient might need to do skilled nursing facility with rehabilitation.   Medical/surgical history: Ventral incision hernia - 8 x 10 cm on exam, He had a laparototomy with enterolysis of adhesions in 11/11/2005 by Dr. Elveria Rising. There was some question of a colon lesion at the time, but this proved not to be true. He did not have any bowel resected. CT scan on 11/18/2013 shows the hernia well. Plan: 1) He is scheduled for surgery 06/09/2014  HPI Elements:   Location:  Alcohol withdrawal, status post surgery. Quality:  Increased agitation and anxiety. Severity:  Forgetfulness and confusion. Timing:  Status post hernia repair. Duration:  10 days. Context:  Questionable medication induced cognitive decline.  Past Medical History:  Past Medical History  Diagnosis Date  . Aortic stenosis, mild   . Dyslipidemia   . Coronary artery disease     CABG 2005, repeat cath with severe 2 vessel ASCAD with high grade stenosis of LAD, patent LIMA to LAD, patent SVG too diag, mildly atretic but widely patent SVG to RCA and high grade stenosis 90% ostial left circ with left dominant with PDA coming off of the left circ s/p rotational atherectomy and cutting balloon PCI with stent to prox left circ  . Heart murmur dx'd 1948  . History of blood transfusion     "related to OR"  . GERD (gastroesophageal reflux disease)   . Duodenal ulcer 1949  . Arthritis     "fingers" (12/14/2013)  . Cellulitis of foot, left 12/13/2013  .  Myocardial infarction     age 44   . Neuromuscular disorder     neuropathy left foot  . Nocturia   . Frequency   . Enlarged prostate   . Ventral hernia     Past Surgical History  Procedure Laterality Date  . Prostate biopsy    . Cholecystectomy  1972  . Abdominal exploration surgery     . Cataract extraction w/ intraocular lens  implant, bilateral Bilateral   . Appendectomy    . Coronary angioplasty with stent placement  12/18/2011    "1"  . Tonsillectomy    . Percutaneous coronary stent intervention (pci-s) N/A 12/18/2011    Procedure: PERCUTANEOUS CORONARY STENT INTERVENTION (PCI-S);  Surgeon: Corky Crafts, MD;  Location: [facility] CATH LAB;  Service: Cardiovascular;  Laterality: N/A;  . Carotid endarterectomy      rt side  . Coronary artery bypass graft  2003    "CABG X3"  . Ventral hernia repair N/A 06/09/2014    Procedure: LAPAROSCOPIC ASSISTED OPEN VENTRAL HERNIA REPAIR;  Surgeon: Ovidio Kin, MD;  Location: [facility] ORS;  Service: General;  Laterality: N/A;  WITH MESH  . Laparoscopic lysis of adhesions N/A 06/09/2014    Procedure: LAPAROSCOPIC LYSIS OF ADHESIONS;  Surgeon: Ovidio Kin, MD;  Location: [facility] ORS;  Service: General;  Laterality: N/A;   Family History:  Family History  Problem Relation Age of Onset  . Heart attack Father   . Arrhythmia Mother    Social History:  History  Alcohol Use  . 2.4 oz/week  . 4 Shots of liquor per week    Comment: 12/14/2013 "~ 4oz scotch/wk"     History  Drug Use No    History   Social History  . Marital Status: Widowed    Spouse Name: N/A    Number of Children: N/A  . Years of Education: N/A   Social History Main Topics  . Smoking status: Former Smoker -- 0.12 packs/day for 2 years    Types: Cigarettes    Quit date: 06/20/1979  . Smokeless tobacco: Never Used  . Alcohol Use: 2.4 oz/week    4 Shots of liquor per week     Comment: 12/14/2013 "~ 4oz scotch/wk"  . Drug Use: No  . Sexual Activity: No   Other Topics Concern  . None   Social History Narrative   Additional Social History:                          Allergies:   Allergies  Allergen Reactions  . Cardura [Doxazosin Mesylate]     Hypotenstion  . Gabapentin     Weight gain    Vitals: Blood pressure 146/93, pulse 86, temperature 97.3  F (36.3 C), temperature source Oral, resp. rate 18, height  (1.778 m), weight 108.047 kg (238 lb 3.2 oz), SpO2 98 %.  Risk to Self: Is patient at risk for suicide?: No Risk to Others:   Prior Inpatient Therapy:   Prior Outpatient Therapy:    Current Facility-Administered Medications  Medication Dose Route Frequency Provider Last Rate Last Dose  . albuterol (PROVENTIL) (2.5 MG/3ML) 0.083% nebulizer solution 2.5 mg  2.5 mg Nebulization Q4H PRN Zigmund Gottron, MD   2.5 mg at 06/17/14 1807  . antiseptic oral rinse (CPC / CETYLPYRIDINIUM CHLORIDE 0.05%) solution 7 mL  7 mL Mouth Rinse q12n4p Ovidio Kin, MD   7 mL at 06/18/14 1634  . chlorhexidine (PERIDEX) 0.12 % solution 15  mL  15 mL Mouth Rinse BID Ovidio Kinavid Newman, MD   15 mL at 06/19/14 0847  . dextrose 5 % and 0.45 % NaCl with KCl 20 mEq/L infusion   Intravenous Continuous Ovidio Kinavid Newman, MD 50 mL/hr at 06/19/14 1045    . folic acid (FOLVITE) tablet 1 mg  1 mg Oral Daily Penny Piarlando Vega, MD   1 mg at 06/19/14 1045  . haloperidol lactate (HALDOL) injection 5 mg  5 mg Intravenous Q6H PRN Chevis PrettyPaul Toth III, MD   5 mg at 06/17/14 0435  . heparin injection 5,000 Units  5,000 Units Subcutaneous 3 times per day Ovidio Kinavid Newman, MD   5,000 Units at 06/19/14 [id number]  . HYDROcodone-acetaminophen (NORCO/VICODIN) 5-325 MG per tablet 1-2 tablet  1-2 tablet Oral Q4H PRN Ovidio Kinavid Newman, MD   2 tablet at 06/15/14 2208  . labetalol (NORMODYNE,TRANDATE) injection 5 mg  5 mg Intravenous Q4H PRN Glenna FellowsBenjamin Hoxworth, MD   5 mg at 06/15/14 0257  . metoprolol tartrate (LOPRESSOR) tablet 12.5 mg  12.5 mg Oral BID Penny Piarlando Vega, MD   12.5 mg at 06/19/14 1044  . multivitamin liquid 5 mL  5 mL Oral Daily Ovidio Kinavid Newman, MD   5 mL at 06/19/14 1045  . phenol (CHLORASEPTIC) mouth spray 1 spray  1 spray Mouth/Throat PRN Valarie MerinoMatthew B Martin, MD   1 spray at 06/09/14 2253  . thiamine (B-1) injection 100 mg  100 mg Intravenous Daily Ovidio Kinavid Newman, MD   100 mg at 06/19/14 1044     Musculoskeletal: Strength & Muscle Tone: decreased Gait & Station: unable to stand Patient leans: N/A  Psychiatric Specialty Exam: Physical Exam as per history and physical  ROS confusion, anxiety, memory deficits  Blood pressure 146/93, pulse 86, temperature 97.3 F (36.3 C), temperature source Oral, resp. rate 18, height 5\' 10"  (1.778 m), weight 108.047 kg (238 lb 3.2 oz), SpO2 98 %.Body mass index is 34.18 kg/(m^2).  General Appearance: Guarded  Eye Contact::  Fair  Speech:  Clear and Coherent  Volume:  Jesus  Mood:  Irritable  Affect:  Appropriate and Congruent  Thought Process:  Coherent and Goal Directed  Orientation:  Full (Time, Place, and Person)  Thought Content:  WDL  Suicidal Thoughts:  No  Homicidal Thoughts:  No  Memory:  Immediate;   Fair Recent;   Poor  Judgement:  Fair  Insight:  Fair  Psychomotor Activity:  Decreased  Concentration:  Fair  Recall:  Fair  Fund of Knowledge:Fair  Language: Good  Akathisia:  NA  Handed:  Right  AIMS (if indicated):     Assets:  Communication Skills Desire for Improvement Financial Resources/Insurance Housing Intimacy Leisure Time Resilience Social Support  ADL's:  Impaired  Cognition: Impaired,  Mild  Sleep:      Medical Decision Making: Review of Psycho-Social Stressors (1), Review or order clinical lab tests (1), Discuss test with performing physician (1), Review and summation of old records (2), New Problem, with no additional work-up planned (3), Review or order medicine tests (1), Review of Medication Regimen & Side Effects (2) and Review of New Medication or Change in Dosage (2)  Treatment Plan Summary: Daily contact with patient to assess and evaluate symptoms and progress in treatment and Medication management  Plan: Recommended discontinue Ativan protocol: Recommended to start Librium 25 mg 2 times daily for possible alcohol withdrawal symptoms for 10 days Minimize or discontinue sedating medication  including opiates to prevent falls and cognitive decline No evidence of imminent risk to self  or others at present.   Patient does not meet criteria for psychiatric inpatient admission.   Disposition: Patient can be discharged to home if patients son can care for him at home if not benefit from the brief stay of skin nursing facility with rehabilitation.  Josiephine Simao,JANARDHAHA R. 06/19/2014 1:27 PM

## 2014-06-19 NOTE — Progress Notes (Signed)
Clinical Social Work Department CLINICAL SOCIAL WORK PSYCHIATRY SERVICE LINE ASSESSMENT 06/19/2014  Patient:  Jesus [name]  Account:  [id number]  Admit Date:  06/09/2014  Clinical Social Worker:  Unk Lightning, LCSW  Date/Time:  06/19/2014 12:00 N Referred by:  Physician  Date referred:  06/19/2014 Reason for Referral  Psychosocial assessment   Presenting Symptoms/Problems (In the person's/family's own words):   Psych consulted due to delirium and substance use   Abuse/Neglect/Trauma History (check all that apply)  Denies history   Abuse/Neglect/Trauma Comments:   Psychiatric History (check all that apply)  Denies history   Psychiatric medications:  Haldol 5 mg  Ativan 1 mg   Current Mental Health Hospitalizations/Previous Mental Health History:   Patient unable to fully participate in assessment but friends at bedside deny any previous MH diagnosis.   Current provider:   None currently   Place and Date:   N/A   Current Medications:   Scheduled Meds:      . antiseptic oral rinse  7 mL Mouth Rinse q12n4p  . chlorhexidine  15 mL Mouth Rinse BID  . folic acid  1 mg Oral Daily  . heparin subcutaneous  5,000 Units Subcutaneous 3 times per day  . metoprolol tartrate  12.5 mg Oral BID  . multivitamin  5 mL Oral Daily  . thiamine IV  100 mg Intravenous Daily        Continuous Infusions:      . dextrose 5 % and 0.45 % NaCl with KCl 20 mEq/L 50 mL/hr at 06/19/14 1045          PRN Meds:.albuterol, haloperidol lactate, HYDROcodone-acetaminophen, labetalol, LORazepam **OR** LORazepam, phenol       Previous Impatient Admission/Date/Reason:   None reported   Emotional Health / Current Symptoms    Suicide/Self Harm  None reported   Suicide attempt in the past:   No SI or HI. No previous suicide attempts mentioned.   Other harmful behavior:   N/A   Psychotic/Dissociative Symptoms  Confusion   Other Psychotic/Dissociative Symptoms:   Patient confused and unable to fully  participate in assessment. Patient is unable to answer basic questions such as which state he is in, the president of the [place], etc.    Attention/Behavioral Symptoms  Inattentive   Other Attention / Behavioral Symptoms:   Patient easily distracted and unable to fully participate. Patient keeps his eyes closed during majority of assessment and has to be encouraged to participate.    Cognitive Impairment  Orientation - Self   Other Cognitive Impairment:   Patient oriented to self but confused throughout assessment.    Mood and Adjustment  Flat    Stress, Anxiety, Trauma, Any Recent Loss/Stressor  None reported   Anxiety (frequency):   N/A   Phobia (specify):   N/A   Compulsive behavior (specify):   N/A   Obsessive behavior (specify):   N/A   Other:   N/A   Substance Abuse/Use  Current substance use   SBIRT completed (please refer for detailed history):  N  Self-reported substance use:   Patient admits to current alcohol use but gives different information on consumption. Friends in room report that patient usually drinks 1-2 drinks a day but patient reports he drinks a fifth or half a gallon of liquor a day. Patient lives alone but friends report they are confident that he is not a heavy drinker.   Urinary Drug Screen Completed:  N Alcohol level:   N/A    Environmental/Housing/Living Arrangement  Stable housing   Who is in the home:   Alone   Emergency contact:  Jesus [name]   Financial  Medicare   Patient's Strengths and Goals (patient's own words):   Patient has supportive friends and family.   Clinical Social Worker's Interpretive Summary:   CSW and psych MD received referral and rounded on patient together. Patient was in room with two friends that were present. Friends report they are family friends and have been involved with patient for several years.    Patient sitting in chair with eyes closed when CSW arrived. Patient confused and unable to  fully participate in assessment. Patient was living at home alone prior to admission and was admitted for hernia surgery. Friends report that patient was healthy and independent prior to surgery and they are hoping he recovers soon. Friends report that patient is stubborn at times but that he was answering questions appropriately this morning.    Patient guarded when asked about substance use. Patient reports that consumption varies from a fifth to a half gallon a day but friends report that patient has a drink at 5pm and maybe one more drink later on that night. Patient unable to provide any further information at this time.    Per chart review, unit CSW working on SNF placement for patient. CSW will continue to follow and will assist as needed.   Disposition:  Recommend Psych CSW continuing to support while in hospital   Jesus [name], KentuckyLCS[place] [phone]

## 2014-06-19 NOTE — Evaluation (Signed)
Physical Therapy Evaluation Patient Details Name: Jesus [name] F Tangredi MRN: [id number] DOB: 09/15/1928 Today's Date: 06/19/2014   History of Present Illness  81 yo male admitted 06/10/14 for hernia repair. 1/18 began AMS/agitation, started on Etoh WD protocol.   Clinical Impression  Patient pleasantly confused, stood briefly with 2 persons, knees buckle  If remains standing. Patient will benefit from PT to address problems listed in note below.     Follow Up Recommendations SNF;Supervision/Assistance - 24 hour    Equipment Recommendations  None recommended by PT    Recommendations for Other Services       Precautions / Restrictions Precautions Precautions: Fall Restrictions Weight Bearing Restrictions: No      Mobility  Bed Mobility Overal bed mobility: Needs Assistance;+2 for physical assistance;+ 2 for safety/equipment Bed Mobility: Supine to Sit     Supine to sit: Total assist;+2 for physical assistance;+2 for safety/equipment;HOB elevated     General bed mobility comments: pt assisted with legs toward edge, 2 persons for getting  trunk upright into sitting, listing heavily to the left/  Transfers Overall transfer level: Needs assistance Equipment used: 2 person hand held assist Transfers: Squat Pivot Transfers     Squat pivot transfers: +2 safety/equipment;+2 physical assistance;Total assist     General transfer comment: stood x 1 with 2  armhold, legs flexed, unable to take a step, wide base, stood again and  physically  pivoted to recliner with 2 + total.  Ambulation/Gait                Stairs            Wheelchair Mobility    Modified Rankin (Stroke Patients Only)       Balance Overall balance assessment: Needs assistance Sitting-balance support: Feet supported;Bilateral upper extremity supported Sitting balance-Leahy Scale: Zero   Postural control: Left lateral lean;Posterior lean                                   Pertinent  Vitals/Pain Pain Assessment: Faces Faces Pain Scale: Hurts little more Pain Location: abdomen Pain Descriptors / Indicators: Cramping Pain Intervention(s): Limited activity within patient's tolerance;Monitored during session;Repositioned    Home Living Family/patient expects to be discharged to:: Private residence                 Additional Comments: has son  and other family    Prior Function Level of Independence: Independent               Hand Dominance        Extremity/Trunk Assessment   Upper Extremity Assessment: Generalized weakness           Lower Extremity Assessment: RLE deficits/detail;LLE deficits/detail RLE Deficits / Details: same as L LLE Deficits / Details: bears weight to stand , knees gradually flex and buckle  Cervical / Trunk Assessment:  (does not support self in sitting)  Communication   Communication: Expressive difficulties  Cognition Arousal/Alertness: Awake/alert Behavior During Therapy: Restless;Impulsive Overall Cognitive Status: Impaired/Different from baseline Area of Impairment: Orientation;Attention;Following commands;Safety/judgement   Current Attention Level: Alternating   Following Commands: Follows one step commands inconsistently            General Comments      Exercises        Assessment/Plan    PT Assessment Patient needs continued PT services  PT Diagnosis Difficulty walking;Generalized weakness;Altered mental status   PT Problem List  Decreased strength;Decreased activity tolerance;Decreased mobility;Decreased balance;Decreased knowledge of precautions;Decreased safety awareness;Decreased knowledge of use of DME  PT Treatment Interventions DME instruction;Gait training;Functional mobility training;Therapeutic activities;Therapeutic exercise;Patient/family education   PT Goals (Current goals can be found in the Care Plan section) Acute Rehab PT Goals Patient Stated Goal: per RN, family wants pt OOB as  much as possible PT Goal Formulation: Patient unable to participate in goal setting Time For Goal Achievement: 07/03/14 Potential to Achieve Goals: Fair    Frequency Min 3X/week   Barriers to discharge Decreased caregiver support      Co-evaluation               End of Session   Activity Tolerance: Patient limited by fatigue Patient left: in chair;with call bell/phone within reach;with family/visitor present Nurse Communication: Mobility status;Need for lift equipment         Time: 1610-9604 PT Time Calculation (min) (ACUTE ONLY): 18 min   Charges:   PT Evaluation $Initial PT Evaluation Tier I: 1 Procedure PT Treatments $Therapeutic Activity: 8-22 mins   PT G Codes:        Rada Hay 06/19/2014, 12:13 PM Blanchard Kelch PT [phone]

## 2014-06-19 NOTE — Progress Notes (Signed)
[facility] PROGRESS NOTE  Jesus [name] ZOX:[id number] DOB: 11/29/1928 DOA: 06/09/2014 PCP: Miguel AschoffOSS,ALLAN, MD  Assessment/Plan: Principal Problem:  Alcohol withdrawal delirium - Continue on CIWA protocol - Increase amount of Ativan that can be given within a 6 hour period - Reassess next a.m. Reportedly patient drinks scotch every day -Still confused. Will obtain routine medical work up: TSH, Folate, Vitamin B 12 within normal limits. RPR and HIV pending. - Will consult Psychiatry to assess for delirium vs dementia  Non sustained V tach - resolved - Added low dose B blocker  Active Problems:   Essential hypertension, benign -Added low dose beta blocker - most likely exacerbated 2ary to alcohol withdrawal   Hyperlipidemia - After discharge may continue home medication regimen.   Coronary artery disease - stable no chest pain   Ventral hernia without obstruction or gangrene - Management per primary team   Code Status: full Family Communication: none at bedside Disposition Plan: Pending improvement in condition.   Procedures:  Please refer to Gen surgery notes  Antibiotics:  None  HPI/Subjective: Pt arousable but still confused  Objective: Filed Vitals:   06/19/14 1044  BP: 146/93  Pulse: 86  Temp:   Resp:     Intake/Output Summary (Last 24 hours) at 06/19/14 1141 Last data filed at 06/19/14 0900  Gross per 24 hour  Intake 1938.75 ml  Output   1556 ml  Net 382.75 ml   Filed Weights   06/16/14 0418 06/18/14 0500 06/19/14 0400  Weight: 111.4 kg (245 lb 9.5 oz) 111.358 kg (245 lb 8 oz) 108.047 kg (238 lb 3.2 oz)    Exam:   General:  Pt in nad, alert and awake, not oriented  Cardiovascular: rrr, no mrg  Respiratory: cta bl, no wheezes  Abdomen: soft, ND  Musculoskeletal: no cyanosis or clubbing on limited exam   Data Reviewed: Basic Metabolic Panel:  Recent Labs Lab 06/13/14 2213 06/14/14 0356 06/16/14 0920 06/17/14 1038  06/18/14 0347 06/19/14 0410  NA 139 138 141  --  143 140  K 4.2 4.0 4.2  --  4.3 4.0  CL 109 108 108  --  109 105  CO2 25 24 25   --  29 27  GLUCOSE 119* 117* 111*  --  126* 109*  BUN 15 15 10   --  10 9  CREATININE 1.13 1.02 1.06  --  1.20 1.12  CALCIUM 8.5 8.3* 8.7  --  8.5 8.7  MG 1.8  --   --  1.6  --   --   PHOS  --   --   --  3.6  --   --    Liver Function Tests:  Recent Labs Lab 06/14/14 0356  AST 48*  ALT 34  ALKPHOS 52  BILITOT 1.1  PROT 5.6*  ALBUMIN 3.0*   No results for input(s): LIPASE, AMYLASE in the last 168 hours. No results for input(s): AMMONIA in the last 168 hours. CBC:  Recent Labs Lab 06/14/14 0700 06/16/14 0920 06/17/14 1038  WBC 7.0 6.7 6.4  NEUTROABS 4.2 4.6 4.4  HGB 11.8* 11.4* 10.9*  HCT 37.3* 35.1* 34.0*  MCV 100.3* 98.9 99.1  PLT 247 258 285   Cardiac Enzymes: No results for input(s): CKTOTAL, CKMB, CKMBINDEX, TROPONINI in the last 168 hours. BNP (last 3 results) No results for input(s): PROBNP in the last 8760 hours. CBG: No results for input(s): GLUCAP in the last 168 hours.  No results found for this or any previous visit (from  the past 240 hour(s)).   Studies: No results found.  Scheduled Meds: . antiseptic oral rinse  7 mL Mouth Rinse q12n4p  . chlorhexidine  15 mL Mouth Rinse BID  . folic acid  1 mg Oral Daily  . heparin subcutaneous  5,000 Units Subcutaneous 3 times per day  . metoprolol tartrate  12.5 mg Oral BID  . multivitamin  5 mL Oral Daily  . thiamine IV  100 mg Intravenous Daily   Continuous Infusions: . dextrose 5 % and 0.45 % NaCl with KCl 20 mEq/L 50 mL/hr at 06/19/14 1045    Principal Problem:   Alcohol withdrawal delirium Active Problems:   Essential hypertension, benign   Hyperlipidemia   Coronary artery disease   Ventral hernia without obstruction or gangrene  Time spent: > 35 minutes    Penny Pia  [facility] Pager [phone]. If 7PM-7AM, please contact night-coverage at  [email or web address], password [facility] 06/19/2014, 11:41 AM  LOS: 10 days

## 2014-06-20 DIAGNOSIS — R4182 Altered mental status, unspecified: Secondary | ICD-10-CM

## 2014-06-20 LAB — BASIC METABOLIC PANEL
Anion gap: 8 (ref 5–15)
BUN: 10 mg/dL (ref 6–23)
CO2: 28 mmol/L (ref 19–32)
CREATININE: 1.15 mg/dL (ref 0.50–1.35)
Calcium: 8.9 mg/dL (ref 8.4–10.5)
Chloride: 106 mmol/L (ref 96–112)
GFR calc Af Amer: 66 mL/min — ABNORMAL LOW (ref >90)
GFR calc non Af Amer: 57 mL/min — ABNORMAL LOW (ref >90)
Glucose, Bld: 103 mg/dL — ABNORMAL HIGH (ref 70–99)
Potassium: 3.9 mmol/L (ref 3.5–5.1)
SODIUM: 142 mmol/L (ref 135–145)

## 2014-06-20 MED ORDER — CHLORDIAZEPOXIDE HCL 25 MG PO CAPS
25.0000 mg | ORAL_CAPSULE | Freq: Once | ORAL | Status: AC
  Administered 2014-06-20: 25 mg via ORAL

## 2014-06-20 MED ORDER — LORAZEPAM 2 MG/ML IJ SOLN: 1.0000 mg | Freq: Once | INTRAMUSCULAR | Status: DC

## 2014-06-20 MED ORDER — CHLORDIAZEPOXIDE HCL 25 MG PO CAPS: 25.0000 mg | ORAL_CAPSULE | Freq: Two times a day (BID) | ORAL | Status: DC

## 2014-06-20 MED ORDER — LORAZEPAM BOLUS VIA INFUSION
1.0000 mg | INTRAVENOUS | Status: DC | PRN
  Filled 2014-06-20: qty 3

## 2014-06-20 MED ORDER — CHLORDIAZEPOXIDE HCL 25 MG PO CAPS
25.0000 mg | ORAL_CAPSULE | Freq: Two times a day (BID) | ORAL | Status: DC
  Administered 2014-06-20: 25 mg via ORAL
  Filled 2014-06-20: qty 1

## 2014-06-20 MED ORDER — ZOLPIDEM TARTRATE 5 MG PO TABS
5.0000 mg | ORAL_TABLET | Freq: Once | ORAL | Status: AC
  Administered 2014-06-20: 5 mg via ORAL
  Filled 2014-06-20: qty 1

## 2014-06-20 MED ORDER — LORAZEPAM 2 MG/ML IJ SOLN
1.0000 mg | Freq: Once | INTRAMUSCULAR | Status: AC
  Administered 2014-06-20: 1 mg via INTRAVENOUS
  Filled 2014-06-20: qty 1

## 2014-06-20 MED ORDER — CHLORDIAZEPOXIDE HCL 5 MG PO CAPS
25.0000 mg | ORAL_CAPSULE | Freq: Two times a day (BID) | ORAL | Status: DC
  Administered 2014-06-20 (×2): 25 mg via ORAL
  Filled 2014-06-20 (×4): qty 1

## 2014-06-20 NOTE — Progress Notes (Addendum)
Stood pt at bedside. Weak gait with legs wobbly. Pt attempts to walk towards chair in front of bed 2 feet away. Assisted back to bed. Continues with constant fidgeting. Family friend at bedside.

## 2014-06-20 NOTE — Progress Notes (Signed)
Clinical Social Work  CSW rounded with patient with psych MD. Patient laying in bed with friend at bedside. Friend reports patient recently received Librium due to agitation. Patient unable to participate at this time so mini mental status exam will be completed at later time.  Medical team met with son Jesus [name]) in hallway. Son reports patient was alert and oriented prior to admission. 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 Patient was active at home and went to the gym and out to eat often. Patient has a friend (who son reports is an alcoholic as well) who comes over daily to drink with patient. Son reports that he spoke with friend to determine how much patient is drinking but feels that patient and friend are not truthful about their consumption. Son reports that patient has drank for several years but is unable to give a specific consumption amount.   Psych MD to continue to monitor medications. Psych CSW will continue to follow to provide support and per chart review, unit CSW is working on SNF placement with family.  [name], [name] [phone]

## 2014-06-20 NOTE — Progress Notes (Signed)
OT Cancellation Note  Patient Details Name: Jesus [name] MRN: [id number] DOB: 05/14/1929   Cancelled Treatment:    Reason Eval/Treat Not Completed: Other (comment).  Pt was given medicine and resting per RN.  Will likely check back tomorrow.  [name] 06/20/2014, 12:27 PM  [name], OTR/L [phone] 06/20/2014

## 2014-06-20 NOTE — Progress Notes (Addendum)
CSW continuing to follow.   CSW noted that PT evaluation completed yesterday and pt has orders to transfer to floor today.  CSW visited pt room, but pt son's not present at this time.  CSW contacted pt son, [name] via telephone and left voice message.  CSW contacted pt son, Sharma Covertorman via telephone. CSW discussed that CSW was going to initiate SNF search as previously discussed. Pt son, Sharma Covertorman agreeable. Pt son, Sharma Covertorman discussed that it will be best to discuss details about SNF with pt son, [name] as [name] is local. CSW expressed understanding.   CSW updated pt FL2 and [facility]   CSW to follow up with pt son, [name] to further discuss and provide bed offers.   CSW to continue to follow to provide support and assist with pt disposition needs.   Addendum 3:47 pm:  CSW received return phone call from pt son, [name].   CSW discussed with pt son that [facility]W [facility]. Pt son in agreement.   Pt [place] [facility], [facility] [place], and [facility]. CSW discussed that all of those facilities were included in the SNF search.   CSW to follow up with pt son re: SNF bed offers.  CSW to continue to follow to provide support and assist with pt disposition needs.   Loletta SpecterSuzanna Kidd, MSW, LCSW Clinical Social Work [phone]

## 2014-06-20 NOTE — Progress Notes (Signed)
General Surgery Note  LOS: 11 days  POD -  11 Days Post-Op  Assessment/Plan: 1.  LAPAROSCOPIC ASSISTED OPEN VENTRAL HERNIA REPAIR, LAPAROSCOPIC LYSIS OF ADHESIONS - 06/09/2014 - D. Ezzard Standing  This has done okay - I removed the second drain today.  His wounds look good.   2.  Confusion - dementia vs alcohol withdrawal vs both  Seems worse that yesterday.  More agitated.  Son [name] in room.  I held off on Librium because yesterday I thought that we were making progress, but between last PM and now he seems to have gone backwards.  Will start Librium 25 mg BID  He pulls the EKG leads and his cardiac status has been stable.  Will transfer to floor with a sitter, but off telimetry.  3. HTN 4. CAD CABG - 2005 Stent placed about 2010. 08/01/2013 - Nuc myocardial perfusion - Moderate inferolateral and anterolateral defect consistent with scar and soft tissue attnenuation with minimal periinfarct ischemia. LV Ejection Fraction: 47%. LV Wall Motion: Lateral hypokinesis.  Dr. Armanda Magic sent a note 12/12/2013 that said he is low risk for surgery.  Had some tachycardias last PM.  Responded to beta blocker. 5. Neuropathy - Both feet, etiology unclear. 6. BPH -sees Dr. Ranelle Oyster  Will leave foley   I would like for his confusion to clear. 7. GERD, but stopped meds about one year ago. 8. Heart murmur 9.  DVT prophylaxis - On SQ Heparin    Principal Problem:   Alcohol withdrawal delirium Active Problems:   Essential hypertension, benign   Hyperlipidemia   Coronary artery disease   Ventral hernia without obstruction or gangrene   Non-sustained ventricular tachycardia  Subjective:  Seem somewhat worse today.  Dr. Cena Benton in room while I was there.  He will talk to psych again.    Objective:   Filed Vitals:   06/20/14 0800  BP:   Pulse:   Temp: 98.6 F (37 C)  Resp:      Intake/Output from previous day:  01/25 0701 - 01/26 0700 In: 3358.8  [P.O.:240; I.V.:1418.8] Out: 1352 [Urine:1350; Stool:2]  Intake/Output this shift:  Total I/O In: -  Out: 425 [Urine:425]   Physical Exam:   General: [name].  More agitated today.  Even with son in room, he is agitated.   HEENT: Normal. Pupils equal. .   Lungs: Clear.     Abdomen: BS present.  Soft.   Wound: Wound looks good.  Left sided drain removed.    Lab Results:     Recent Labs  06/17/14 1038  WBC 6.4  HGB 10.9*  HCT 34.0*  PLT 285    BMET    Recent Labs  06/19/14 0410 06/20/14 0410  NA 140 142  K 4.0 3.9  CL 105 106  CO2 27 28  GLUCOSE 109* 103*  BUN 9 10  CREATININE 1.12 1.15  CALCIUM 8.7 8.9    PT/INR  No results for input(s): LABPROT, INR in the last 72 hours.  ABG  No results for input(s): PHART, HCO3 in the last 72 hours.  Invalid input(s): PCO2, PO2   Studies/Results:  No results found.   Anti-infectives:   Anti-infectives    Start     Dose/Rate Route Frequency Ordered Stop   06/09/14 0719  ceFAZolin (ANCEF) IVPB 2 g/50 mL premix     2 g100 mL/hr over 30 Minutes Intravenous On call to O.R. 06/09/14 0719 06/09/14 0930      Ovidio Kin, MD,  FACS Pager: [phone] [facility] Office: [phone] 06/20/2014

## 2014-06-20 NOTE — Progress Notes (Signed)
[facility] PROGRESS NOTE  Otho Perlorman F Napierala YNW:[id number] DOB: 04/05/1929 DOA: 06/09/2014 PCP: Miguel AschoffOSS,ALLAN, MD  Assessment/Plan: Principal Problem:  Alcohol withdrawal delirium -Patient has had > 5 days of Ativan which would suffice for treatment of Alcohol Withdrawal per my discussion with Psychiatry.  Altered Mental Status - At this juncture medical work up negative.  I am suspecting that patient has an underlying psychiatric condition for which he continues to be altered. - As such will reconsult Psychiatry for evaluation and assistance with agitation as patient has been pulling IV lines pulling at sutures etc.    Non sustained V tach - resolved - Added low dose B blocker  Active Problems:   Essential hypertension, benign - Added low dose beta blocker - most likely exacerbated 2ary to alcohol withdrawal   Hyperlipidemia - After discharge may continue home medication regimen.   Coronary artery disease - stable no chest pain   Ventral hernia without obstruction or gangrene - Management per primary team   Code Status: full Family Communication: none at bedside Disposition Plan: Per recommendations of General surgery and psychiatry.  Will sign off.   Procedures:  Please refer to Gen surgery notes  Antibiotics:  None  HPI/Subjective: Pt confused and pulling at IV lines and sutures  Objective: Filed Vitals:   06/20/14 0800  BP:   Pulse:   Temp: 98.6 F (37 C)  Resp:     Intake/Output Summary (Last 24 hours) at 06/20/14 0917 Last data filed at 06/20/14 0840  Gross per 24 hour  Intake 2563.75 ml  Output   1527 ml  Net 1036.75 ml   Filed Weights   06/16/14 0418 06/18/14 0500 06/19/14 0400  Weight: 111.4 kg (245 lb 9.5 oz) 111.358 kg (245 lb 8 oz) 108.047 kg (238 lb 3.2 oz)    Exam:   General:  Pt in nad, alert and awake, not oriented  Cardiovascular: rrr, no mrg  Respiratory: cta bl, no wheezes  Abdomen: soft, ND  Musculoskeletal: no  cyanosis or clubbing on limited exam   Data Reviewed: Basic Metabolic Panel:  Recent Labs Lab 06/13/14 2213 06/14/14 0356 06/16/14 0920 06/17/14 1038 06/18/14 0347 06/19/14 0410 06/20/14 0410  NA 139 138 141  --  143 140 142  K 4.2 4.0 4.2  --  4.3 4.0 3.9  CL 109 108 108  --  109 105 106  CO2 25 24 25   --  29 27 28   GLUCOSE 119* 117* 111*  --  126* 109* 103*  BUN 15 15 10   --  10 9 10   CREATININE 1.13 1.02 1.06  --  1.20 1.12 1.15  CALCIUM 8.5 8.3* 8.7  --  8.5 8.7 8.9  MG 1.8  --   --  1.6  --   --   --   PHOS  --   --   --  3.6  --   --   --    Liver Function Tests:  Recent Labs Lab 06/14/14 0356  AST 48*  ALT 34  ALKPHOS 52  BILITOT 1.1  PROT 5.6*  ALBUMIN 3.0*   No results for input(s): LIPASE, AMYLASE in the last 168 hours. No results for input(s): AMMONIA in the last 168 hours. CBC:  Recent Labs Lab 06/14/14 0700 06/16/14 0920 06/17/14 1038  WBC 7.0 6.7 6.4  NEUTROABS 4.2 4.6 4.4  HGB 11.8* 11.4* 10.9*  HCT 37.3* 35.1* 34.0*  MCV 100.3* 98.9 99.1  PLT 247 258 285   Cardiac Enzymes: No  results for input(s): CKTOTAL, CKMB, CKMBINDEX, TROPONINI in the last 168 hours. BNP (last 3 results) No results for input(s): PROBNP in the last 8760 hours. CBG: No results for input(s): GLUCAP in the last 168 hours.  No results found for this or any previous visit (from the past 240 hour(s)).   Studies: No results found.  Scheduled Meds: . antiseptic oral rinse  7 mL Mouth Rinse q12n4p  . chlordiazePOXIDE  25 mg Oral BID  . chlorhexidine  15 mL Mouth Rinse BID  . folic acid  1 mg Oral Daily  . heparin subcutaneous  5,000 Units Subcutaneous 3 times per day  . metoprolol tartrate  12.5 mg Oral BID  . multivitamin  5 mL Oral Daily  . thiamine IV  100 mg Intravenous Daily   Continuous Infusions: . dextrose 5 % and 0.45 % NaCl with KCl 20 mEq/L 50 mL/hr at 06/19/14 2000    Principal Problem:   Alcohol withdrawal delirium Active Problems:   Essential  hypertension, benign   Hyperlipidemia   Coronary artery disease   Ventral hernia without obstruction or gangrene  Time spent: > 35 minutes   Penny Pia  [facility] Pager [phone]. If 7PM-7AM, please contact night-coverage at [email or web address], password [facility] 06/20/2014, 9:17 AM  LOS: 11 days

## 2014-06-20 NOTE — Progress Notes (Signed)
Educated family that Ativan is available PRN when they are comfortable about giving.

## 2014-06-20 NOTE — Progress Notes (Signed)
Clinical Social Work Department CLINICAL SOCIAL WORK PLACEMENT NOTE 06/20/2014  Patient:  Jesus [name],Jesus [name]  Account Number:  [id number] Admit date:  06/09/2014  Clinical Social Worker:  Garlan FairSUZANNA KIDD, LCSWA  Date/time:  06/20/2014 03:44 PM  Clinical Social Work is seeking post-discharge placement for this patient at the following level of care:   SKILLED NURSING   (*CSW will update this form in Epic as items are completed)   06/20/2014  Patient/family provided with [facility] Department of Clinical Social Work's list of facilities offering this level of care within the geographic area requested by the patient (or if unable, by the patient's family).  06/20/2014  Patient/family informed of their freedom to choose among providers that offer the needed level of care, that participate in Medicare, Medicaid or managed care program needed by the patient, have an available bed and are willing to accept the patient.  06/20/2014  Patient/family informed of [facility]' ownership interest in [facility], as well as of the fact that they are under no obligation to receive care at this facility.  PASARR submitted to EDS on 06/20/2014 PASARR number received on 06/20/2014  FL2 transmitted to all facilities in geographic area requested by pt/family on  06/20/2014 FL2 transmitted to all facilities within larger geographic area on   Patient informed that his/her managed care company has contracts with or will negotiate with  certain facilities, including the following:     Patient/family informed of bed offers received:   Patient chooses bed at  Physician recommends and patient chooses bed at    Patient to be transferred to  on   Patient to be transferred to facility by  Patient and family notified of transfer on  Name of family member notified:    The following physician request were entered in Epic:   Additional Comments:   Loletta SpecterSuzanna Kidd, MSW, LCSW Clinical Social Work [phone]

## 2014-06-20 NOTE — Progress Notes (Signed)
Text-paged MD Cena BentonVega about patient pulling at wires and removing abd band.

## 2014-06-20 NOTE — Consult Note (Signed)
Psychiatry Consult follow up  Reason for Consult:  Alcohol withdrawal delirium Referring Physician:  Dr. Ezzard Standing and Dr. Cena Benton  Patient Identification: Jesus [name] MRN:  [id number] Principal Diagnosis: Alcohol withdrawal delirium Diagnosis:   Patient Active Problem List   Diagnosis Date Noted  . Altered mental status [R41.82] 06/20/2014  . Non-sustained ventricular tachycardia [I47.2] 06/17/2014  . Alcohol withdrawal delirium [F10.231] 06/16/2014  . Ventral hernia without obstruction or gangrene [K43.9] 06/09/2014  . Cellulitis of foot, left [L03.116] 12/13/2013  . Cellulitis [L03.90] 12/13/2013  . Incisional hernia, without obstruction or gangrene [K43.2] 11/15/2013  . Pain in lower limb [M79.606] 07/15/2013  . Aortic stenosis [I35.0] 07/13/2013  . Coronary artery disease [I25.10]   . Coronary atherosclerosis of native coronary artery [I25.10] 07/05/2013  . Essential hypertension, benign [I10] 07/05/2013  . Hyperlipidemia [E78.5] 07/05/2013  . Onychomycosis [B35.1] 02/11/2013  . Ingrown nail [L60.0] 11/12/2012  . Paronychia of great toe of left foot [L03.032] 11/12/2012  . Pain in toe of left foot [M79.675] 11/12/2012    Total Time spent with patient: 20 minutes  Subjective:   Jesus [name] is a 81 y.o. male patient admitted with delirium.  HPI:  [name] is a 81 year old male seen and chart reviewed for psychiatric consultation and evaluation. Patient family friends for at bedside who is able to contribute to this evaluation. Patient reports drinking regularly alcohol but denied history of withdrawal and cravings. Patient has cognitive decline due to multiple medication while in the hospital as per the family friends. Patient reportedly had a head near repair. Patient was found to be somewhat confused, forgetful and memory difficulties. Patient has no history of dementia or delirium before going to the surgery. Patient has responded fairly to most of the questions related to  orientation, concentration but has difficulty with the memory. Patient son [name] is not at bedside during my visit. Reportedly patient son has a plans about moving in with his father if needed after discharge from the hospital. If not patient might need to do skilled nursing facility with rehabilitation.   Interval history: Patient seen today with clinical social worker for follow-up psychiatric consultation. Staff RN reported patient has been irritable and agitated and required medication to calm him down. Patient son Mr. [name] stated that patient has been functional and independent until a few months ago. Reportedly patient might be drinking more than he was seen before hospitalized for hernia repair. Patient was known to be controlling and stubborn as per his son. Patient was not able to contribute to this evaluation because of sedation secondary to medication. Recommended to continue Librium 25 mg 2 times daily for consistent and no rebound anxiety We will follow-up with him tomorrow morning for further assessment and possible treatment needs.  Past Medical History:  Past Medical History  Diagnosis Date  . Aortic stenosis, mild   . Dyslipidemia   . Coronary artery disease     CABG 2005, repeat cath with severe 2 vessel ASCAD with high grade stenosis of LAD, patent LIMA to LAD, patent SVG too diag, mildly atretic but widely patent SVG to RCA and high grade stenosis 90% ostial left circ with left dominant with PDA coming off of the left circ s/p rotational atherectomy and cutting balloon PCI with stent to prox left circ  . Heart murmur dx'd 1948  . History of blood transfusion     "related to OR"  . GERD (gastroesophageal reflux disease)   . Duodenal ulcer 1949  .  Arthritis     "fingers" (12/14/2013)  . Cellulitis of foot, left 12/13/2013  . Myocardial infarction     age 81   . Neuromuscular disorder     neuropathy left foot  . Nocturia   . Frequency   . Enlarged prostate   . Ventral  hernia     Past Surgical History  Procedure Laterality Date  . Prostate biopsy    . Cholecystectomy  1972  . Abdominal exploration surgery    . Cataract extraction w/ intraocular lens  implant, bilateral Bilateral   . Appendectomy    . Coronary angioplasty with stent placement  12/18/2011    "1"  . Tonsillectomy    . Percutaneous coronary stent intervention (pci-s) N/A 12/18/2011    Procedure: PERCUTANEOUS CORONARY STENT INTERVENTION (PCI-S);  Surgeon: Corky CraftsJayadeep S Varanasi, MD;  [facility];  Service: Cardiovascular;  Laterality: N/A;  . Carotid endarterectomy      rt side  . Coronary artery bypass graft  2003    "CABG X3"  . Ventral hernia repair N/A 06/09/2014    Procedure: LAPAROSCOPIC ASSISTED OPEN VENTRAL HERNIA REPAIR;  Surgeon: Ovidio Kinavid Newman, MD;  Location: [facility] ORS;  Service: General;  Laterality: N/A;  WITH MESH  . Laparoscopic lysis of adhesions N/A 06/09/2014    Procedure: LAPAROSCOPIC LYSIS OF ADHESIONS;  Surgeon: Ovidio Kinavid Newman, MD;  Location: [facility] ORS;  Service: General;  Laterality: N/A;   Family History:  Family History  Problem Relation Age of Onset  . Heart attack Father   . Arrhythmia Mother    Social History:  History  Alcohol Use  . 2.4 oz/week  . 4 Shots of liquor per week    Comment: 12/14/2013 "~ 4oz scotch/wk"     History  Drug Use No    History   Social History  . Marital Status: Widowed    Spouse Name: N/A    Number of Children: N/A  . Years of Education: N/A   Social History Main Topics  . Smoking status: Former Smoker -- 0.12 packs/day for 2 years    Types: Cigarettes    Quit date: 06/20/1979  . Smokeless tobacco: Never Used  . Alcohol Use: 2.4 oz/week    4 Shots of liquor per week     Comment: 12/14/2013 "~ 4oz scotch/wk"  . Drug Use: No  . Sexual Activity: No   Other Topics Concern  . None   Social History Narrative   Additional Social History:                          Allergies:   Allergies  Allergen Reactions   . Cardura [Doxazosin Mesylate]     Hypotenstion  . Gabapentin     Weight gain    Vitals: Blood pressure 160/94, pulse 66, temperature 98.6 F (37 C), temperature source Axillary, resp. rate 18, height 5\' 10"  (1.778 m), weight 108.047 kg (238 lb 3.2 oz), SpO2 92 %.  Risk to Self: Is patient at risk for suicide?: No Risk to Others:   Prior Inpatient Therapy:   Prior Outpatient Therapy:    Current Facility-Administered Medications  Medication Dose Route Frequency Provider Last Rate Last Dose  . albuterol (PROVENTIL) (2.5 MG/3ML) 0.083% nebulizer solution 2.5 mg  2.5 mg Nebulization Q4H PRN Zigmund GottronElizabeth C Deterding, MD   2.5 mg at 06/17/14 1807  . antiseptic oral rinse (CPC / CETYLPYRIDINIUM CHLORIDE 0.05%) solution 7 mL  7 mL Mouth Rinse q12n4p Ovidio Kinavid Newman,  MD   7 mL at 06/18/14 1634  . chlordiazePOXIDE (LIBRIUM) capsule 25 mg  25 mg Oral BID Penny Pia, MD   25 mg at 06/20/14 1020  . chlorhexidine (PERIDEX) 0.12 % solution 15 mL  15 mL Mouth Rinse BID Ovidio Kin, MD   15 mL at 06/19/14 2301  . dextrose 5 % and 0.45 % NaCl with KCl 20 mEq/L infusion   Intravenous Continuous Ovidio Kin, MD 50 mL/hr at 06/19/14 2000    . folic acid (FOLVITE) tablet 1 mg  1 mg Oral Daily Penny Pia, MD   1 mg at 06/19/14 1045  . haloperidol lactate (HALDOL) injection 5 mg  5 mg Intravenous Q6H PRN Chevis Pretty III, MD   5 mg at 06/20/14 0109  . heparin injection 5,000 Units  5,000 Units Subcutaneous 3 times per day Ovidio Kin, MD   5,000 Units at 06/19/14 2300  . HYDROcodone-acetaminophen (NORCO/VICODIN) 5-325 MG per tablet 1-2 tablet  1-2 tablet Oral Q4H PRN Ovidio Kin, MD   2 tablet at 06/20/14 0417  . labetalol (NORMODYNE,TRANDATE) injection 5 mg  5 mg Intravenous Q4H PRN Glenna Fellows, MD   5 mg at 06/15/14 0257  . metoprolol tartrate (LOPRESSOR) tablet 12.5 mg  12.5 mg Oral BID Penny Pia, MD   12.5 mg at 06/20/14 1018  . multivitamin liquid 5 mL  5 mL Oral Daily Ovidio Kin, MD   5 mL at  06/19/14 1045  . phenol (CHLORASEPTIC) mouth spray 1 spray  1 spray Mouth/Throat PRN Valarie Merino, MD   1 spray at 06/09/14 2253  . thiamine (B-1) injection 100 mg  100 mg Intravenous Daily Ovidio Kin, MD   100 mg at 06/19/14 1044    Musculoskeletal: Strength & Muscle Tone: decreased Gait & Station: unable to stand Patient leans: N/A  Psychiatric Specialty Exam: Physical Exam as per history and physical  ROS confusion, anxiety, memory deficits  Blood pressure 160/94, pulse 66, temperature 98.6 F (37 C), temperature source Axillary, resp. rate 18, height 5\' 10"  (1.778 m), weight 108.047 kg (238 lb 3.2 oz), SpO2 92 %.Body mass index is 34.18 kg/(m^2).  General Appearance: Guarded  Eye Contact::  Fair  Speech:  Clear and Coherent  Volume:  Normal  Mood:  Irritable  Affect:  Appropriate and Congruent  Thought Process:  Coherent and Goal Directed  Orientation:  Full (Time, Place, and Person)  Thought Content:  WDL  Suicidal Thoughts:  No  Homicidal Thoughts:  No  Memory:  Immediate;   Fair Recent;   Poor  Judgement:  Fair  Insight:  Fair  Psychomotor Activity:  Decreased  Concentration:  Fair  Recall:  [employer] of Knowledge:Fair  Language: Good  Akathisia:  NA  Handed:  Right  AIMS (if indicated):     Assets:  Communication Skills Desire for Improvement Financial Resources/Insurance Housing Intimacy Leisure Time Resilience Social Support  ADL's:  Impaired  Cognition: Impaired,  Mild  Sleep:      Medical Decision Making: Review of Psycho-Social Stressors (1), Review or order clinical lab tests (1), Discuss test with performing physician (1), Review and summation of old records (2), New Problem, with no additional work-up planned (3), Review or order medicine tests (1), Review of Medication Regimen & Side Effects (2) and Review of New Medication or Change in Dosage (2)  Treatment Plan Summary: Daily contact with patient to assess and evaluate symptoms and  progress in treatment and Medication management  Plan: Psychiatric consultation will  continue to follow up as clinically required. Continue Librium 25 mg BID for possible alcohol withdrawal symptoms for 10 days Minimize or discontinue sedating medication including opiates to prevent falls and cognitive decline No evidence of imminent risk to self or others at present.   Patient does not meet criteria for psychiatric inpatient admission.   Disposition: Patient can be discharged to home if patients son can care for him at home if not benefit from the brief stay of skin nursing facility with rehabilitation.  Magenta Schmiesing,JANARDHAHA R. 06/20/2014 10:23 AM

## 2014-06-21 ENCOUNTER — Inpatient Hospital Stay (HOSPITAL_COMMUNITY): Payer: Medicare Other

## 2014-06-21 ENCOUNTER — Encounter (HOSPITAL_COMMUNITY): Payer: Self-pay | Admitting: Internal Medicine

## 2014-06-21 DIAGNOSIS — F05 Delirium due to known physiological condition: Secondary | ICD-10-CM | POA: Diagnosis present

## 2014-06-21 DIAGNOSIS — G609 Hereditary and idiopathic neuropathy, unspecified: Secondary | ICD-10-CM | POA: Diagnosis present

## 2014-06-21 DIAGNOSIS — I10 Essential (primary) hypertension: Secondary | ICD-10-CM

## 2014-06-21 DIAGNOSIS — F10231 Alcohol dependence with withdrawal delirium: Secondary | ICD-10-CM

## 2014-06-21 LAB — URINALYSIS, ROUTINE W REFLEX MICROSCOPIC
Bilirubin Urine: NEGATIVE
GLUCOSE, UA: NEGATIVE mg/dL
Ketones, ur: NEGATIVE mg/dL
Nitrite: NEGATIVE
Protein, ur: 30 mg/dL — AB
Specific Gravity, Urine: 1.014 (ref 1.005–1.030)
Urobilinogen, UA: 1 mg/dL (ref 0.0–1.0)
pH: 7 (ref 5.0–8.0)

## 2014-06-21 LAB — URINE MICROSCOPIC-ADD ON

## 2014-06-21 MED ORDER — ATENOLOL 50 MG PO TABS
50.0000 mg | ORAL_TABLET | Freq: Every day | ORAL | Status: DC
  Administered 2014-06-22: 50 mg via ORAL
  Filled 2014-06-21 (×3): qty 1

## 2014-06-21 MED ORDER — TAMSULOSIN HCL 0.4 MG PO CAPS
0.8000 mg | ORAL_CAPSULE | Freq: Every day | ORAL | Status: DC
  Administered 2014-06-22: 0.8 mg via ORAL
  Filled 2014-06-21 (×3): qty 2

## 2014-06-21 MED ORDER — ADULT MULTIVITAMIN W/MINERALS CH
1.0000 | ORAL_TABLET | Freq: Every day | ORAL | Status: DC
  Filled 2014-06-21: qty 1

## 2014-06-21 MED ORDER — LORAZEPAM 2 MG/ML IJ SOLN: 0.0000 mg | Freq: Two times a day (BID) | INTRAMUSCULAR | Status: DC

## 2014-06-21 MED ORDER — LORAZEPAM 2 MG/ML IJ SOLN: 1.0000 mg | Freq: Four times a day (QID) | INTRAMUSCULAR | Status: DC | PRN

## 2014-06-21 MED ORDER — LORAZEPAM 2 MG/ML IJ SOLN: 1.0000 mg | Freq: Four times a day (QID) | INTRAMUSCULAR | Status: DC

## 2014-06-21 MED ORDER — ACETAMINOPHEN 10 MG/ML IV SOLN
1000.0000 mg | Freq: Four times a day (QID) | INTRAVENOUS | Status: AC
  Administered 2014-06-21 – 2014-06-22 (×2): 1000 mg via INTRAVENOUS
  Filled 2014-06-21 (×7): qty 100

## 2014-06-21 MED ORDER — DIAZEPAM 5 MG/ML IJ SOLN
10.0000 mg | Freq: Three times a day (TID) | INTRAMUSCULAR | Status: DC | PRN
  Administered 2014-06-22: 10 mg via INTRAVENOUS
  Filled 2014-06-21: qty 2

## 2014-06-21 MED ORDER — FOLIC ACID 1 MG PO TABS
1.0000 mg | ORAL_TABLET | Freq: Every day | ORAL | Status: DC
Start: 2014-06-21 — End: 2014-06-21
  Filled 2014-06-21: qty 1

## 2014-06-21 MED ORDER — FENTANYL CITRATE 0.05 MG/ML IJ SOLN
12.5000 ug | INTRAMUSCULAR | Status: DC | PRN
  Administered 2014-06-23: 12.5 ug via INTRAVENOUS
  Filled 2014-06-21 (×2): qty 2

## 2014-06-21 MED ORDER — HALOPERIDOL LACTATE 5 MG/ML IJ SOLN
0.5000 mg | Freq: Three times a day (TID) | INTRAMUSCULAR | Status: DC
  Administered 2014-06-21 – 2014-06-23 (×5): 0.5 mg via INTRAVENOUS
  Filled 2014-06-21 (×2): qty 0.1
  Filled 2014-06-21 (×2): qty 1
  Filled 2014-06-21 (×2): qty 0.1
  Filled 2014-06-21: qty 1
  Filled 2014-06-21 (×2): qty 0.1

## 2014-06-21 MED ORDER — FOLIC ACID 5 MG/ML IJ SOLN
1.0000 mg | Freq: Every day | INTRAMUSCULAR | Status: DC
  Administered 2014-06-21 – 2014-06-22 (×2): 1 mg via INTRAVENOUS
  Filled 2014-06-21 (×5): qty 0.2

## 2014-06-21 MED ORDER — ENOXAPARIN SODIUM 60 MG/0.6ML ~~LOC~~ SOLN
0.5000 mg/kg | SUBCUTANEOUS | Status: DC
  Administered 2014-06-21 – 2014-06-22 (×2): 55 mg via SUBCUTANEOUS
  Filled 2014-06-21 (×3): qty 0.6

## 2014-06-21 MED ORDER — LORAZEPAM 1 MG PO TABS: 1.0000 mg | ORAL_TABLET | Freq: Four times a day (QID) | ORAL | Status: DC | PRN

## 2014-06-21 MED ORDER — CLONIDINE HCL 0.1 MG/24HR TD PTWK
0.1000 mg | MEDICATED_PATCH | TRANSDERMAL | Status: DC
  Administered 2014-06-21: 0.1 mg via TRANSDERMAL
  Filled 2014-06-21: qty 1

## 2014-06-21 MED ORDER — CEFTRIAXONE SODIUM IN DEXTROSE 20 MG/ML IV SOLN
1.0000 g | INTRAVENOUS | Status: DC
  Administered 2014-06-21 – 2014-06-22 (×2): 1 g via INTRAVENOUS
  Filled 2014-06-21 (×3): qty 50

## 2014-06-21 MED ORDER — ASPIRIN 325 MG PO TABS
325.0000 mg | ORAL_TABLET | Freq: Every day | ORAL | Status: DC
  Administered 2014-06-22 – 2014-06-23 (×2): 325 mg via ORAL
  Filled 2014-06-21 (×3): qty 1

## 2014-06-21 MED ORDER — SENNOSIDES-DOCUSATE SODIUM 8.6-50 MG PO TABS: 2.0000 | ORAL_TABLET | Freq: Every evening | ORAL | Status: DC | PRN

## 2014-06-21 MED ORDER — LORAZEPAM 2 MG/ML IJ SOLN
0.0000 mg | Freq: Four times a day (QID) | INTRAMUSCULAR | Status: DC
  Administered 2014-06-21: 1 mg via INTRAVENOUS
  Filled 2014-06-21: qty 1

## 2014-06-21 MED ORDER — HALOPERIDOL LACTATE 5 MG/ML IJ SOLN: 5.0000 mg | Freq: Four times a day (QID) | INTRAMUSCULAR | Status: DC | PRN

## 2014-06-21 MED ORDER — BISACODYL 10 MG RE SUPP
10.0000 mg | Freq: Once | RECTAL | Status: AC
  Administered 2014-06-21: 10 mg via RECTAL
  Filled 2014-06-21: qty 1

## 2014-06-21 MED ORDER — DIAZEPAM 5 MG/ML IJ SOLN
5.0000 mg | Freq: Two times a day (BID) | INTRAMUSCULAR | Status: DC
  Administered 2014-06-21 – 2014-06-22 (×2): 5 mg via INTRAVENOUS
  Filled 2014-06-21 (×2): qty 2

## 2014-06-21 MED ORDER — SPIRITUS FRUMENTI
1.0000 | ORAL | Status: DC | PRN
  Administered 2014-06-22: 1 via ORAL
  Filled 2014-06-21 (×3): qty 1

## 2014-06-21 NOTE — Progress Notes (Signed)
CSW continuing to follow.   CSW received phone call from pt friend, Earney NavyJackie Brown this morning concerning pt family wishes to have a family meeting with pt two sons and MD. CSW discussed that consulting hospitalists has consulted palliative medicine team. Per pt friend, pt sons and pt friends will be present at 3 pm today and hopeful to have family meeting at that time and request for CSW to be present.  CSW spoke with Dr. [name] who confirmed that she can meet with pt family today at 3 pm.  Palliative Care Meeting held with Dr. [name] at 3 pm today. CSW present. Extensive discussion with pt family and Dr. [name] plans to do an extensive medical work up to evaluate what may be contributing to pt delirium. Pt family very receptive to Dr. [name] and appreciative of her meeting with pt family. Support provided.   CSW to continue to follow to provide support and assist with pt disposition planning.  Loletta SpecterSuzanna Scherry Laverne, MSW, LCSW Clinical Social Work [phone]

## 2014-06-21 NOTE — Progress Notes (Signed)
Reported to Dr. Ezzard StandingNewman that patient had a small BM this am,and he said okay to administer Dulcolax suppositoryx1.- Hulda Marinonna Hailei Besser RN

## 2014-06-21 NOTE — Consult Note (Signed)
Palliative Medicine Team Consult Note  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 His alcohol use is a difficult subject for this family-but there has clearly been a long term habit despite him being successful in his life and work. He is very social and ETOH has been a large part of his social interactions. He drinks daily and has several oz of "Dewars scotch" every night. Family report he played golf, worked out at [employer], went out to eat with friends, took care of his house and his finances right up until this admission. They also report that 5 years ago he had surgery and also had AMS and delirium but it rapidly resolved once morphine was stopped and when he got home.  Palliative Team was consulted for symptom management for delirium and goals of care discussion.  Patient Active Problem List   Diagnosis Date Noted  . Delirium due to multiple etiologies 06/21/2014  . Idiopathic peripheral neuropathy 06/21/2014  . Non-sustained ventricular tachycardia 06/17/2014  . Alcohol withdrawal delirium 06/16/2014  . Ventral hernia without obstruction or gangrene 06/09/2014  . Aortic stenosis 07/13/2013  . Coronary artery disease   . Coronary atherosclerosis of native coronary artery 07/05/2013  . Essential hypertension, benign 07/05/2013  . Hyperlipidemia 07/05/2013  . Onychomycosis 02/11/2013    Exam: Overweight, deeply sedated and sleeping Lungs clear No edema in his lower legs Heart  rate is regular, slightly tachy  Reviewed all available history, lab work, imaging and history available in the EMR.   Assessment:  [name] has a persistent/refractory post operative delirium- likely multifactorial including multiple drug offenders, newly diagnosed UTI, possible uncontrolled pain, and ETOH withdrawal. He is HOD #12. His symptoms are severe, combative and largely refractory to high doses of benzos and haldol. He is currently very sedated.   Family express very high level of frustration in achieving specialized care for delirium and management. I provided reassurance and education and also explain the role of palliative care - reassured them that our scope had very little to do with end of life but more with quality of life.  Recommendations:  1. Delirium Nursing Recommendations: see order for details -reorientation, allow for uninterrupted sleep, white noise etc..gentle reassurance, sitter at all times due to fall risk  2. Complete Medication Reconciliation he has multiple drugs that could contributing to his delirium-listed below- many of them have already been stopped prior to my consult but may still be in his system.   Stopped Clonidine- he was not on BP meds prior to admission-confirmed with family and PCP-he stopped them himself  Started Atenolol BB less associated with delirium-he has been tachycardic and hypertensive probably due to distress  Stopped his multiple opiates-  Scheduled Tylenol IV q6 hours until he can take PO  Will use Fentanyl prn low dose for uncontrolled pain- it is very short acting and less likey to cause delirium  Stopped unnecessary meds like the multivitamins and the PRNs  Stopped the  CIWA protocol- he is post-op day 12 and this is no longer acute etoh withdrawal- it may be prolonged withdrawal and persistent delirium-benzos may be causing his delirium as well.  Will start very low dose of scheduled diazepam which is longer acting and  more evenly dispersed and overall less likely to cause problems-if his behavior get very severe I have ordered a dose of . Ultimately I would like to wean him off bezos all together and just let him have his scotch- as a comfort i have ordered Scotch/ETOH prn from the pharmacy upon his request- his family are certain that this will help.  Needs PT for mobility-when awake.  Started Flomax for urinary retention-retention can cause delirium- I have requested that nursing monitor this.  He has a UTI this is probably the single most important thing to treat- delirium is a symptom of UTI and should be worked up always at the first sign of confusion-antibiotics were started -awaiting culture- he has BPH so retension and UTI are high risk.   3. Delirium work-up Completion:  I think a comprehensive work-up is in order for persistent delirium   CT Head-I am certain he has vascular disease-including low level vascular dementia with acute delirium presentation  Await urine Culture  No need for Chest XR-at least for now.  Comprehensive labs-including LFT, Magnesium level  Trial of IV fluids based on labs  Avoid Foley's, No restraints   Appreciate this consultation-will remeet with family 1/29 at 330 to update them after work-up complete and to see how he is doing after the UTI is treated and his meds are adjusted.  Anderson Malta, DO Palliative Medicine [phone]

## 2014-06-21 NOTE — Consult Note (Signed)
Psychiatry Consult follow up  Reason for Consult:  Alcohol withdrawal delirium Referring Physician:  Dr. Ezzard StandingNewman and Dr. Cena BentonVega  Patient Identification: Jesus [name] MRN:  [id number] Principal Diagnosis: Alcohol withdrawal delirium Diagnosis:   Patient Active Problem List   Diagnosis Date Noted  . Altered mental status [R41.82] 06/20/2014  . Non-sustained ventricular tachycardia [I47.2] 06/17/2014  . Alcohol withdrawal delirium [F10.231] 06/16/2014  . Ventral hernia without obstruction or gangrene [K43.9] 06/09/2014  . Cellulitis of foot, left [L03.116] 12/13/2013  . Cellulitis [L03.90] 12/13/2013  . Incisional hernia, without obstruction or gangrene [K43.2] 11/15/2013  . Pain in lower limb [M79.606] 07/15/2013  . Aortic stenosis [I35.0] 07/13/2013  . Coronary artery disease [I25.10]   . Coronary atherosclerosis of native coronary artery [I25.10] 07/05/2013  . Essential hypertension, benign [I10] 07/05/2013  . Hyperlipidemia [E78.5] 07/05/2013  . Onychomycosis [B35.1] 02/11/2013  . Ingrown nail [L60.0] 11/12/2012  . Paronychia of great toe of left foot [L03.032] 11/12/2012  . Pain in toe of left foot [M79.675] 11/12/2012    Total Time spent with patient: 20 minutes  Subjective:   Jesus [name] is a 81 y.o. male patient admitted with delirium.  HPI:  Normal Jesus [name] is a 81 year old male seen and chart reviewed for psychiatric consultation and evaluation. Patient family friends for at bedside who is able to contribute to this evaluation. Patient reports drinking regularly alcohol but denied history of withdrawal and cravings. Patient has cognitive decline due to multiple medication while in the hospital as per the family friends. Patient reportedly had a head near repair. Patient was found to be somewhat confused, forgetful and memory difficulties. Patient has no history of dementia or delirium before going to the surgery. Patient has responded fairly to most of the questions related to  orientation, concentration but has difficulty with the memory. Patient son Jesus [name] is not at bedside during my visit. Reportedly patient son has a plans about moving in with his father if needed after discharge from the hospital. If not patient might need to do skilled nursing facility with rehabilitation.   Interval history: Patient seen with clinical social worker for follow-up psychiatric consultation. Patient has been sleeping without distress and safety sitter states that he has been restless most of the day and now able to relax and sleep. LCSW attempted to complete MMSE without success.   Patient son [name] stated that patient has been functional and independent until a few months ago. Reportedly patient might be drinking more than he was seen before hospitalized for hernia repair. Patient was known to be controlling and stubborn as per his son. Patient was not able to contribute to this evaluation because of sedation secondary to medication.   Past Medical History:  Past Medical History  Diagnosis Date  . Aortic stenosis, mild   . Dyslipidemia   . Coronary artery disease     CABG 2005, repeat cath with severe 2 vessel ASCAD with high grade stenosis of LAD, patent LIMA to LAD, patent SVG too diag, mildly atretic but widely patent SVG to RCA and high grade stenosis 90% ostial left circ with left dominant with PDA coming off of the left circ s/p rotational atherectomy and cutting balloon PCI with stent to prox left circ  . Heart murmur dx'd 1948  . History of blood transfusion     "related to OR"  . GERD (gastroesophageal reflux disease)   . Duodenal ulcer 1949  . Arthritis     "fingers" (12/14/2013)  .  Cellulitis of foot, left 12/13/2013  . Myocardial infarction     age 71   . Neuromuscular disorder     neuropathy left foot  . Nocturia   . Frequency   . Enlarged prostate   . Ventral hernia     Past Surgical History  Procedure Laterality Date  . Prostate biopsy    .  Cholecystectomy  1972  . Abdominal exploration surgery    . Cataract extraction w/ intraocular lens  implant, bilateral Bilateral   . Appendectomy    . Coronary angioplasty with stent placement  12/18/2011    "1"  . Tonsillectomy    . Percutaneous coronary stent intervention (pci-s) N/A 12/18/2011    Procedure: PERCUTANEOUS CORONARY STENT INTERVENTION (PCI-S);  Surgeon: Corky Crafts, MD;  Location: [facility] CATH LAB;  Service: Cardiovascular;  Laterality: N/A;  . Carotid endarterectomy      rt side  . Coronary artery bypass graft  2003    "CABG X3"  . Ventral hernia repair N/A 06/09/2014    Procedure: LAPAROSCOPIC ASSISTED OPEN VENTRAL HERNIA REPAIR;  Surgeon: Ovidio Kin, MD;  Location: [facility] ORS;  Service: General;  Laterality: N/A;  WITH MESH  . Laparoscopic lysis of adhesions N/A 06/09/2014    Procedure: LAPAROSCOPIC LYSIS OF ADHESIONS;  Surgeon: Ovidio Kin, MD;  Location: [facility] ORS;  Service: General;  Laterality: N/A;   Family History:  Family History  Problem Relation Age of Onset  . Heart attack Father   . Arrhythmia Mother    Social History:  History  Alcohol Use  . 2.4 oz/week  . 4 Shots of liquor per week    Comment: 12/14/2013 "~ 4oz scotch/wk"     History  Drug Use No    History   Social History  . Marital Status: Widowed    Spouse Name: N/A    Number of Children: N/A  . Years of Education: N/A   Social History Main Topics  . Smoking status: Former Smoker -- 0.12 packs/day for 2 years    Types: Cigarettes    Quit date: 06/20/1979  . Smokeless tobacco: Never Used  . Alcohol Use: 2.4 oz/week    4 Shots of liquor per week     Comment: 12/14/2013 "~ 4oz scotch/wk"  . Drug Use: No  . Sexual Activity: No   Other Topics Concern  . None   Social History Narrative   Additional Social History:                          Allergies:   Allergies  Allergen Reactions  . Cardura [Doxazosin Mesylate]     Hypotenstion  . Gabapentin     Weight gain     Vitals: Blood pressure 134/72, pulse 104, temperature 98 F (36.7 C), temperature source Oral, resp. rate 18, height 5\' 10"  (1.778 m), weight 108.047 kg (238 lb 3.2 oz), SpO2 93 %.  Risk to Self: Is patient at risk for suicide?: No Risk to Others:   Prior Inpatient Therapy:   Prior Outpatient Therapy:    Current Facility-Administered Medications  Medication Dose Route Frequency Provider Last Rate Last Dose  . cefTRIAXone (ROCEPHIN) 1 g in dextrose 5 % 50 mL IVPB - Premix  1 g Intravenous Q24H Meredeth Ide, MD   1 g at 06/21/14 1339  . cloNIDine (CATAPRES - Dosed in mg/24 hr) patch 0.1 mg  0.1 mg Transdermal Weekly Meredeth Ide, MD   0.1 mg at 06/21/14  1221  . folic acid (FOLVITE) tablet 1 mg  1 mg Oral Daily Penny Pia, MD   1 mg at 06/19/14 1045  . folic acid (FOLVITE) tablet 1 mg  1 mg Oral Daily Meredeth Ide, MD   1 mg at 06/21/14 1300  . haloperidol lactate (HALDOL) injection 5 mg  5 mg Intravenous Q6H PRN Meredeth Ide, MD      . heparin injection 5,000 Units  5,000 Units Subcutaneous 3 times per day Ovidio Kin, MD   5,000 Units at 06/21/14 0702  . HYDROcodone-acetaminophen (NORCO/VICODIN) 5-325 MG per tablet 1-2 tablet  1-2 tablet Oral Q4H PRN Ovidio Kin, MD   2 tablet at 06/20/14 0417  . labetalol (NORMODYNE,TRANDATE) injection 5 mg  5 mg Intravenous Q4H PRN Glenna Fellows, MD   5 mg at 06/15/14 0257  . LORazepam (ATIVAN) injection 0-4 mg  0-4 mg Intravenous 4 times per day Meredeth Ide, MD   2 mg at 06/21/14 1339   Followed by  . [START ON 06/23/2014] LORazepam (ATIVAN) injection 0-4 mg  0-4 mg Intravenous Q12H Meredeth Ide, MD      . LORazepam (ATIVAN) tablet 1 mg  1 mg Oral Q6H PRN Meredeth Ide, MD       Or  . LORazepam (ATIVAN) injection 1 mg  1 mg Intravenous Q6H PRN Meredeth Ide, MD      . metoprolol tartrate (LOPRESSOR) tablet 12.5 mg  12.5 mg Oral BID Penny Pia, MD   12.5 mg at 06/20/14 2224  . multivitamin liquid 5 mL  5 mL Oral Daily Ovidio Kin, MD   5 mL  at 06/19/14 1045  . multivitamin with minerals tablet 1 tablet  1 tablet Oral Daily Meredeth Ide, MD   1 tablet at 06/21/14 1300  . phenol (CHLORASEPTIC) mouth spray 1 spray  1 spray Mouth/Throat PRN Valarie Merino, MD   1 spray at 06/09/14 2253  . thiamine (B-1) injection 100 mg  100 mg Intravenous Daily Ovidio Kin, MD   100 mg at 06/21/14 1045    Musculoskeletal: Strength & Muscle Tone: decreased Gait & Station: unable to stand Patient leans: N/A  Psychiatric Specialty Exam: Physical Exam as per history and physical  ROS confusion, anxiety, memory deficits  Blood pressure 134/72, pulse 104, temperature 98 F (36.7 C), temperature source Oral, resp. rate 18, height  (1.778 m), weight 108.047 kg (238 lb 3.2 oz), SpO2 93 %.Body mass index is 34.18 kg/(m^2).  General Appearance: Guarded  Eye Contact::  Fair  Speech:  Clear and Coherent  Volume:  Normal  Mood:  Irritable  Affect:  Appropriate and Congruent  Thought Process:  Coherent and Goal Directed  Orientation:  Full (Time, Place, and Person)  Thought Content:  WDL  Suicidal Thoughts:  No  Homicidal Thoughts:  No  Memory:  Immediate;   Fair Recent;   Poor  Judgement:  Fair  Insight:  Fair  Psychomotor Activity:  Decreased  Concentration:  Fair  Recall:  [employer] of Knowledge:Fair  Language: Good  Akathisia:  NA  Handed:  Right  AIMS (if indicated):     Assets:  Communication Skills Desire for Improvement Financial Resources/Insurance Housing Intimacy Leisure Time Resilience Social Support  ADL's:  Impaired  Cognition: Impaired,  Mild  Sleep:      Medical Decision Making: Review of Psycho-Social Stressors (1), Review or order clinical lab tests (1), Discuss test with performing physician (1), Review and summation of  old records (2), New Problem, with no additional work-up planned (3), Review or order medicine tests (1), Review of Medication Regimen & Side Effects (2) and Review of New Medication or  Change in Dosage (2)  Treatment Plan Summary: Daily contact with patient to assess and evaluate symptoms and progress in treatment and Medication management  Plan: Psychiatric consultation will continue to follow up as clinically required. Continue Librium 25 mg BID for possible alcohol withdrawal symptoms for 10 days Minimize or discontinue sedating medication including opiates to prevent falls and cognitive decline No evidence of imminent risk to self or others at present.   Patient does not meet criteria for psychiatric inpatient admission.   Disposition: Patient can be discharged to home if patients son can care for him at home if not benefit from the brief stay of skin nursing facility with rehabilitation.  Alvia Jablonski,JANARDHAHA R. 06/21/2014 2:15 PM

## 2014-06-21 NOTE — Progress Notes (Signed)
Physical Therapy Treatment Patient Details Name: Jesus [name] MRN: [id number] DOB: [birth year] Today's Date: 06/21/2014    History of Present Illness 81 yo male admitted 06/10/14 for hernia repair. 1/18 began AMS/agitation.    PT Comments    Pt in bed restless with 2 visitors and a sitter.  Sitter reported pt is trying to crawl OOB and has been combative.  Pt responds to name.  HOH so required repeat instruction.  Assisted OOB with max encouragement.  Required Max Assist to self rise and + 3 assist to amb safely such that recliner was following due to B LE weakness and knees buckling.  Used B platform walker (EVA walker).  HIGH FALL RISK.  Impaired cognition.    Follow Up Recommendations  SNF (unless ALF can provide appropriate 24/7 care and ensure a safe D/C.  Pt needs assist with all mobility and self care.)     Equipment Recommendations       Recommendations for Other Services       Precautions / Restrictions Precautions Precautions: Fall Precaution Comments: ABD binder Restrictions Weight Bearing Restrictions: No    Mobility  Bed Mobility Overal bed mobility: + 2 for safety/equipment Bed Mobility: Supine to Sit     Supine to sit: +2 for safety/equipment;Max assist     General bed mobility comments: max assist upper body despite cueing to use B UE's to increase self rise.  Pt was pulling therapist onto bed as he was trying to get up.  Once upright pt was able to sit EOB at Supervision level.    Transfers Overall transfer level: Needs assistance Equipment used: 2 person hand held assist Transfers: Sit to/from Stand Sit to Stand: +2 safety/equipment;Min guard   Squat pivot transfers: Max assist     General transfer comment: pt impulsive and stood as soon as her get to EOB.  Partial stance and required x 3 attempts to clear hips off bed.  Poor standing posture of flex hips and knees.  Poor balance.  Ambulation/Gait Ambulation/Gait assistance: +2 safety/equipment;Min  assist Ambulation Distance (Feet): 125 Feet Assistive device: Bilateral platform walker (EVA walker) Gait Pattern/deviations: Step-through pattern;Trunk flexed;Narrow base of support;Shuffle Gait velocity: decreased   General Gait Details: used EVA walker and + 3 assist to amb.  Poor posture of flex hips/knees with narrow BOS and poor balance.  Pt required assist to control walker safely. Limited amb tolerance/weakness.  HIGH FALL RISK.  Poor cognition and poor body awareness/balance correction.    Stairs            Wheelchair Mobility    Modified Rankin (Stroke Patients Only)       Balance                                    Cognition Arousal/Alertness: Awake/alert Behavior During Therapy: Restless;Impulsive Overall Cognitive Status: Impaired/Different from baseline (per family)                      Exercises      General Comments        Pertinent Vitals/Pain Pain Assessment: No/denies pain    Home Living                      Prior Function            PT Goals (current goals can now be found in the care plan section)  Progress towards PT goals: Progressing toward goals    Frequency  Min 3X/week    PT Plan      Co-evaluation             End of Session Equipment Utilized During Treatment: Gait belt Activity Tolerance: Other (comment) (limited by impaired cognition) Patient left: in chair;with call bell/phone within reach;with family/visitor present;with nursing/sitter in room     Time: 1152-1210 PT Time Calculation (min) (ACUTE ONLY): 18 min  Charges:  $Gait Training: 8-22 mins                    G Codes:      Felecia ShellingLori Toshiba Null  PTA [facility]  Acute  Rehab Pager      [phone]

## 2014-06-21 NOTE — Progress Notes (Signed)
Dr Sharl MaLama notified re: results of urinalysis,gave me a verbal order for Rocephin 1 gm IV every 24 hours.[name] RN

## 2014-06-21 NOTE — Progress Notes (Signed)
Clinical Social Work  CSW rounded with psych MD on patient. Patient currently sleeping and sitter reports that family is meeting with palliative team to complete goals of care meeting. CSW will follow up at later time.  [place]opeckHolly Arley Garant, Kentucky[place]CSW [phone]

## 2014-06-21 NOTE — Consult Note (Signed)
Consulting physician Dr. Ezzard Standing  Reason for consult- Delirium   HPI: 81 year old male who   has a past medical history of Aortic stenosis, mild; Dyslipidemia; Coronary artery disease; Heart murmur (dx'd 1948); History of blood transfusion; GERD (gastroesophageal reflux disease); Duodenal ulcer (1949); Arthritis; Cellulitis of foot, left (12/13/2013); Myocardial infarction; Neuromuscular disorder; Nocturia; Frequency; Enlarged prostate; Ventral hernia; Cellulitis of foot, left (12/13/2013); Incisional hernia, without obstruction or gangrene (11/15/2013); Pain in lower limb (07/15/2013); and Paronychia of great toe of left foot (11/12/2012).  Patient was admitted for elective ventral hernia repair and developed persistent delirium postop. [facility] hospital[facility] service was consulted on 06/16/2014, at that time it was thought that patient had alcohol withdrawal delirium so was started on CIWA protocol. Patient also was started on Haldol when necessary. He was seen by psychiatry at that time they switched him to Librium 25 mg twice a day. But after starting Librium patient has become more agitated and confused, [facility] hospitalist service was again consulted for further recommendation. Discussed with caregiver at bedside who is also their neighbor, patient has been more confused since Librium was started. Patient was unable to sleep much last night. And has been agitated with trying to get out of bed.  Allergies:   Allergies  Allergen Reactions  . Cardura [Doxazosin Mesylate]     Hypotenstion  . Gabapentin     Weight gain      Past Medical History  Diagnosis Date  . Aortic stenosis, mild   . Dyslipidemia   . Coronary artery disease     CABG 2005, repeat cath with severe 2 vessel ASCAD with high grade stenosis of LAD, patent LIMA to LAD, patent SVG too diag, mildly atretic but widely patent SVG to RCA and high grade stenosis 90% ostial left circ with left dominant with PDA coming off of the left circ  s/p rotational atherectomy and cutting balloon PCI with stent to prox left circ  . Heart murmur dx'd 1948  . History of blood transfusion     "related to OR"  . GERD (gastroesophageal reflux disease)   . Duodenal ulcer 1949  . Arthritis     "fingers" (12/14/2013)  . Cellulitis of foot, left 12/13/2013  . Myocardial infarction     age 84   . Neuromuscular disorder     neuropathy left foot  . Nocturia   . Frequency   . Enlarged prostate   . Ventral hernia   . Cellulitis of foot, left 12/13/2013  . Incisional hernia, without obstruction or gangrene 11/15/2013  . Pain in lower limb 07/15/2013  . Paronychia of great toe of left foot 11/12/2012    Past Surgical History  Procedure Laterality Date  . Prostate biopsy    . Cholecystectomy  1972  . Abdominal exploration surgery    . Cataract extraction w/ intraocular lens  implant, bilateral Bilateral   . Appendectomy    . Coronary angioplasty with stent placement  12/18/2011    "1"  . Tonsillectomy    . Percutaneous coronary stent intervention (pci-s) N/A 12/18/2011    Procedure: PERCUTANEOUS CORONARY STENT INTERVENTION (PCI-S);  Surgeon: Corky Crafts, MD;  Location: [facility] CATH LAB;  Service: Cardiovascular;  Laterality: N/A;  . Carotid endarterectomy      rt side  . Coronary artery bypass graft  2003    "CABG X3"  . Ventral hernia repair N/A 06/09/2014    Procedure: LAPAROSCOPIC ASSISTED OPEN VENTRAL HERNIA REPAIR;  Surgeon: Ovidio Kin, MD;  Location: [facility] ORS;  Service: General;  Laterality: N/A;  WITH MESH  . Laparoscopic lysis of adhesions N/A 06/09/2014    Procedure: LAPAROSCOPIC LYSIS OF ADHESIONS;  Surgeon: Ovidio Kinavid Newman, MD;  Location: [facility] ORS;  Service: General;  Laterality: N/A;    Prior to Admission medications   Medication Sig Start Date End Date Taking? Authorizing Provider  alfuzosin (UROXATRAL) 10 MG 24 hr tablet Take 10 mg by mouth daily with breakfast.  01/10/14  Yes Historical Provider, MD  aspirin EC 81 MG tablet Take  1 tablet (81 mg total) by mouth daily. 07/13/13  Yes Quintella Reichertraci R Turner, MD  B Complex-C (B-COMPLEX WITH VITAMIN C) tablet Take 1 tablet by mouth daily.   Yes Historical Provider, MD  ibuprofen (ADVIL,MOTRIN) 200 MG tablet Take 600 mg by mouth every 6 (six) hours as needed for mild pain or moderate pain.   Yes Historical Provider, MD  Multiple Vitamins-Minerals (CENTRUM SILVER PO) Take 1 tablet by mouth daily.   Yes Historical Provider, MD  pantoprazole (PROTONIX) 40 MG tablet Take 40 mg by mouth daily.   Yes Historical Provider, MD  triamcinolone cream (KENALOG) 0.1 % Apply 1 application topically 2 (two) times daily.   Yes Historical Provider, MD  doxycycline (VIBRAMYCIN) 100 MG capsule Take 1 capsule (100 mg total) by mouth 2 (two) times daily. Patient not taking: Reported on 06/07/2014 12/15/13   Kela MillinAdeline C Viyuoh, MD  oxyCODONE (OXY IR/ROXICODONE) 5 MG immediate release tablet Take 1 tablet (5 mg total) by mouth every 6 (six) hours as needed for severe pain (moderate pain). Patient not taking: Reported on 06/07/2014 12/15/13   Kela MillinAdeline C Viyuoh, MD    Social History:  reports that he quit smoking about 35 years ago. His smoking use included Cigarettes. He has a .24 pack-year smoking history. He has never used smokeless tobacco. He reports that he drinks about 2.4 oz of alcohol per week. He reports that he does not use illicit drugs.  Family History  Problem Relation Age of Onset  . Heart attack Father   . Arrhythmia Mother      All the positives are listed in BOLD  Review of Systems:  Unable to obtain due to patient's altered mental status  Physical Exam: Blood pressure 134/72, pulse 104, temperature 98 F (36.7 C), temperature source Oral, resp. rate 18, height 5\' 10"  (1.778 m), weight 108.047 kg (238 lb 3.2 oz), SpO2 93 %. Constitutional:   Patient is a well-developed and well-nourished *male, agitated and confused. Head: Normocephalic and atraumatic Mouth: Mucus membranes moist Eyes:  PERRL, EOMI, conjunctivae normal Neck: Supple, No Thyromegaly Cardiovascular: RRR, S1 normal, S2 normal Pulmonary/Chest: CTAB, no wheezes, rales, or rhonchi Abdominal: Soft. Non-tender, non-distended, bowel sounds are normal, no masses, organomegaly, or guarding present.  Neurological: Alert but not oriented 3 , Strength is normal and symmetric bilaterally, cranial nerve II-XII are grossly intact, no focal motor deficit, sensory intact to light touch bilaterally.  Extremities : No Cyanosis, Clubbing or Edema  Labs on Admission:  Basic Metabolic Panel:  Recent Labs Lab 06/16/14 0920 06/17/14 1038 06/18/14 0347 06/19/14 0410 06/20/14 0410  NA 141  --  143 140 142  K 4.2  --  4.3 4.0 3.9  CL 108  --  109 105 106  CO2 25  --  29 27 28   GLUCOSE 111*  --  126* 109* 103*  BUN 10  --  10 9 10   CREATININE 1.06  --  1.20 1.12 1.15  CALCIUM 8.7  --  8.5 8.7 8.9  MG  --  1.6  --   --   --   PHOS  --  3.6  --   --   --    Liver Function Tests: No results for input(s): AST, ALT, ALKPHOS, BILITOT, PROT, ALBUMIN in the last 168 hours. No results for input(s): LIPASE, AMYLASE in the last 168 hours. No results for input(s): AMMONIA in the last 168 hours. CBC:  Recent Labs Lab 06/16/14 0920 06/17/14 1038  WBC 6.7 6.4  NEUTROABS 4.6 4.4  HGB 11.4* 10.9*  HCT 35.1* 34.0*  MCV 98.9 99.1  PLT 258 285    Radiological Exams on Admission: Ct Head Wo Contrast  06/21/2014   CLINICAL DATA:  Delirium. Confusion, combative. Does not answer questions.  EXAM: CT HEAD WITHOUT CONTRAST  TECHNIQUE: Contiguous axial images were obtained from the base of the skull through the vertex without intravenous contrast.  COMPARISON:  None.  FINDINGS: Prominence of the sulci and ventricles are noted. There is mild low attenuation throughout the subcortical and periventricular white matter compatible with chronic microvascular disease.  No evidence for acute intracranial hemorrhage, cortical infarct, or mass. No  abnormal extra-axial fluid collections.  The paranasal sinuses are clear. The mastoid air cells are clear. The skull is intact.  IMPRESSION: 1. No acute intracranial abnormalities. 2. Small vessel ischemic disease and brain atrophy.   Electronically Signed   By: Signa Kell M.D.   On: 06/21/2014 18:21       Assessment/Plan Principal Problem:   Alcohol withdrawal delirium Active Problems:   Essential hypertension, benign   Hyperlipidemia   Coronary artery disease   Ventral hernia without obstruction or gangrene   Non-sustained ventricular tachycardia   Delirium due to multiple etiologies   Idiopathic peripheral neuropathy  Postop delirium As per family request, spoke to [facility] psych physician Dr. Ludger Nutting. Discussed the medications, at this time would continue with CIWA protocol, and Haldol 5 mg IV every 6 hours when necessary for agitation. We'll consult palliative care for symptom management and goals of care.  UTI- checked a UA this morning which showed moderate WBC, started on IV Rocephin. Follow urine culture results.  ? Alcohol withdrawal As per family patient drinks 2 drinks of scotch every night, he is 13 days post op. Will continue with CIWA protocol at this time.  Hypertension Patient not taking oral meds, will start Catapres 0.1 mg patch every 7 days.  Code status: Full code  Family discussion: Admission, patients condition and plan of care including tests being ordered have been discussed with the caregiver at bedside* who indicate understanding and agree with the plan and Code Status.   Time Spent on Admission: 65 min  Lorieann Argueta S [facility] Pager: [phone] 06/21/2014, 7:09 PM  If 7PM-7AM, please contact night-coverage  [email or web address]  Password [email or web address]

## 2014-06-21 NOTE — Progress Notes (Signed)
General Surgery Note  LOS: 12 days  POD -  12 Days Post-Op  Assessment/Plan: 1.  LAPAROSCOPIC ASSISTED OPEN VENTRAL HERNIA REPAIR, LAPAROSCOPIC LYSIS OF ADHESIONS - 06/09/2014 - D. Hikeem Andersson  Operative incisions doing okay.   2.  Confusion - dementia vs alcohol withdrawal vs both  Transferred to floor (3W) yesterday.  On Librium 25 mg BID  Family requesting geriatrician to see the patient.  I told them that I do not think that they do acute inpatient consults.  They will look for a name.  Responsive today, but disoriented.  Spit out food.   3. HTN 4. CAD CABG - 2005 Stent placed about 2010. 08/01/2013 - Nuc myocardial perfusion - Moderate inferolateral and anterolateral defect consistent with scar and soft tissue attnenuation with minimal periinfarct ischemia. LV Ejection Fraction: 47%. LV Wall Motion: Lateral hypokinesis.  Dr. Armanda Magicraci Turner sent a note 12/12/2013 that said he is low risk for surgery.  Had some tachycardias last PM.  Responded to beta blocker. 5. Neuropathy - Both feet, etiology unclear. 6. BPH -sees Dr. Ranelle OysterJ. Wrenn  Will try foley out.  He is far out enough post op to have recovered from surgery.  This may get him back to more normal baseline. 7. GERD, but stopped meds about one year ago. 8. Heart murmur 9.  DVT prophylaxis - On SQ Heparin    Principal Problem:   Alcohol withdrawal delirium Active Problems:   Essential hypertension, benign   Hyperlipidemia   Coronary artery disease   Ventral hernia without obstruction or gangrene   Non-sustained ventricular tachycardia   Altered mental status  Subjective:  More alert this AM.    Carvel GettingFriend, [name], in room with patient.  I spent 20 minutes with her.  She had concerns about meds he is on, wants a list of meds, is requesting a geriatric medicine consult, and raises concerns about him getting out of bed.  Objective:   Filed Vitals:   06/21/14 0400  BP: 160/85  Pulse: 95   Temp: 99 F (37.2 C)  Resp: 16     Intake/Output from previous day:  01/26 0701 - 01/27 0700 In: 770 [P.O.:120; I.V.:650] Out: 1627 [Urine:1625; Stool:2]  Intake/Output this shift:  Total I/O In: -  Out: 300 [Urine:300]   Physical Exam:   General: [name].  Alert, but confused.  In mittens.  Picking at things, but he stopped to listen to my questions.   HEENT: Normal. Pupils equal. .   Lungs: Clear.     Abdomen: BS present.  Soft.   Wound: Wound looks good.      Lab Results:    No results for input(s): WBC, HGB, HCT, PLT in the last 72 hours.  BMET    Recent Labs  06/19/14 0410 06/20/14 0410  NA 140 142  K 4.0 3.9  CL 105 106  CO2 27 28  GLUCOSE 109* 103*  BUN 9 10  CREATININE 1.12 1.15  CALCIUM 8.7 8.9    PT/INR  No results for input(s): LABPROT, INR in the last 72 hours.  ABG  No results for input(s): PHART, HCO3 in the last 72 hours.  Invalid input(s): PCO2, PO2   Studies/Results:  No results found.   Anti-infectives:   Anti-infectives    Start     Dose/Rate Route Frequency Ordered Stop   06/09/14 0719  ceFAZolin (ANCEF) IVPB 2 g/50 mL premix     2 g100 mL/hr over 30 Minutes Intravenous On call to O.R.  06/09/14 0719 06/09/14 0930      Ovidio Kin, MD, FACS Pager: [phone] [facility] Office: [phone] 06/21/2014

## 2014-06-21 NOTE — Progress Notes (Addendum)
ANTICOAGULATION CONSULT NOTE - Initial Consult  Pharmacy Consult for Lovenox Indication: VTE prophylaxis  Allergies  Allergen Reactions  . Cardura [Doxazosin Mesylate]     Hypotenstion  . Gabapentin     Weight gain    Patient Measurements: Height: 5\' 10"  (177.8 cm) Weight: 238 lb 3.2 oz (108.047 kg) IBW/kg (Calculated) : 73   Vital Signs: Temp: 98 F (36.7 C) (01/27 1411) Temp Source: Oral (01/27 1411) BP: 134/72 mmHg (01/27 1411) Pulse Rate: 104 (01/27 1411)  Labs:  Recent Labs  06/19/14 0410 06/20/14 0410  CREATININE 1.12 1.15    Estimated Creatinine Clearance: 59.9 mL/min (by C-G formula based on Cr of 1.15).   Medical History: Past Medical History  Diagnosis Date  . Aortic stenosis, mild   . Dyslipidemia   . Coronary artery disease     CABG 2005, repeat cath with severe 2 vessel ASCAD with high grade stenosis of LAD, patent LIMA to LAD, patent SVG too diag, mildly atretic but widely patent SVG to RCA and high grade stenosis 90% ostial left circ with left dominant with PDA coming off of the left circ s/p rotational atherectomy and cutting balloon PCI with stent to prox left circ  . Heart murmur dx'd 1948  . History of blood transfusion     "related to OR"  . GERD (gastroesophageal reflux disease)   . Duodenal ulcer 1949  . Arthritis     "fingers" (12/14/2013)  . Cellulitis of foot, left 12/13/2013  . Myocardial infarction     age 81   . Neuromuscular disorder     neuropathy left foot  . Nocturia   . Frequency   . Enlarged prostate   . Ventral hernia   . Cellulitis of foot, left 12/13/2013  . Incisional hernia, without obstruction or gangrene 11/15/2013  . Pain in lower limb 07/15/2013  . Paronychia of great toe of left foot 11/12/2012    Assessment: Jesus [name] with PMH of CAD s/p CABG, HLD, heart murmur, GERD, neuropathy who presented 1/15 for elective repair of ventral incisional hernia.  Patient reportedly drank alcohol everyday and developed worsening  confusion and AMS after cessation of alcohol. Patient placed on CIWA protocol for alcohol withdrawal delirium. Pharmacy consulted today to switch patient from heparin SQ to lovenox for VTE prophylaxis, as once daily dosing desired.  Today, 06/21/2014:  Last Heparin 5000 units SQ dose given at 1451  SCr from 1/26 = 1.15, CrCl ~ 60 mL/min CG  H/H low, but fairly stable. Pltc WNL.  BMI 34  No bleeding issues reported.  Goal of Therapy:  Appropriate dosing for weight, BMI, indication Absence of VTE Monitor platelets by anticoagulation protocol: Yes   Plan:   Start Lovenox 0.5 mg/kg (55 mg) SQ q24h at 2200 tonight for BMI > 30, CrCl > 30 mL/min.  Monitor renal function, CBC, and for signs/symptoms of bleeding.  Pharmacy will follow at a distance and make invervention as indicated.  Thank you for the consult.   Greer PickerelJigna Elliett Guarisco, PharmD, BCPS Pager: [phone] 06/21/2014 5:19 PM

## 2014-06-21 NOTE — Plan of Care (Signed)
Problem: Phase II Progression Outcomes Goal: Tolerating diet Outcome: Not Progressing Patient refused to eat,confused,agitated.

## 2014-06-21 NOTE — Progress Notes (Signed)
OT Cancellation Note  Patient Details Name: Jesus [name] MRN: [id number] DOB: 06/04/1928   Cancelled Treatment:    Reason Eval/Treat Not Completed: Other (comment). Attempted eval:  Pt was not following commands.  He moved bil UEs WFLs without cues.  Will reattempt another time.  Jesus [name] 06/21/2014, 2:59 PM  Marica OtterMaryellen Lorel Lembo, OTR/L [phone] 06/21/2014

## 2014-06-22 LAB — CBC
HCT: 38.3 % — ABNORMAL LOW (ref 39.0–52.0)
Hemoglobin: 12.5 g/dL — ABNORMAL LOW (ref 13.0–17.0)
MCH: 31.8 pg (ref 26.0–34.0)
MCHC: 32.6 g/dL (ref 30.0–36.0)
MCV: 97.5 fL (ref 78.0–100.0)
PLATELETS: 356 10*3/uL (ref 150–400)
RBC: 3.93 MIL/uL — AB (ref 4.22–5.81)
RDW: 12.8 % (ref 11.5–15.5)
WBC: 10.5 10*3/uL (ref 4.0–10.5)

## 2014-06-22 LAB — URINE CULTURE: Colony Count: >=100000

## 2014-06-22 LAB — COMPREHENSIVE METABOLIC PANEL
ALBUMIN: 3.1 g/dL — AB (ref 3.5–5.2)
ALT: 62 U/L — AB (ref 0–53)
AST: 62 U/L — ABNORMAL HIGH (ref 0–37)
Alkaline Phosphatase: 60 U/L (ref 39–117)
Anion gap: 10 (ref 5–15)
BUN: 15 mg/dL (ref 6–23)
CHLORIDE: 105 mmol/L (ref 96–112)
CO2: 26 mmol/L (ref 19–32)
CREATININE: 1.32 mg/dL (ref 0.50–1.35)
Calcium: 8.8 mg/dL (ref 8.4–10.5)
GFR calc Af Amer: 56 mL/min — ABNORMAL LOW (ref >90)
GFR calc non Af Amer: 48 mL/min — ABNORMAL LOW (ref >90)
GLUCOSE: 121 mg/dL — AB (ref 70–99)
Potassium: 3.8 mmol/L (ref 3.5–5.1)
Sodium: 141 mmol/L (ref 135–145)
TOTAL PROTEIN: 6.4 g/dL (ref 6.0–8.3)
Total Bilirubin: 0.9 mg/dL (ref 0.3–1.2)

## 2014-06-22 LAB — MAGNESIUM: MAGNESIUM: 1.8 mg/dL (ref 1.5–2.5)

## 2014-06-22 MED ORDER — MAGNESIUM SULFATE 2 GM/50ML IV SOLN
2.0000 g | Freq: Once | INTRAVENOUS | Status: AC
Start: 2014-06-22 — End: 2014-06-22
  Administered 2014-06-22: 2 g via INTRAVENOUS
  Filled 2014-06-22: qty 50

## 2014-06-22 MED ORDER — SODIUM CHLORIDE 0.9 % IV SOLN
INTRAVENOUS | Status: AC
  Administered 2014-06-22: 11:00:00 via INTRAVENOUS

## 2014-06-22 MED ORDER — LIDOCAINE 5 % EX PTCH
1.0000 | MEDICATED_PATCH | CUTANEOUS | Status: DC
  Administered 2014-06-22: 1 via TRANSDERMAL
  Filled 2014-06-22 (×2): qty 1

## 2014-06-22 NOTE — Consult Note (Signed)
Psychiatry Consult follow up  Reason for Consult:  Alcohol withdrawal delirium vs dementia Referring Physician:  Dr. Ezzard StandingNewman and Dr. Cena BentonVega  Patient Identification: Jesus [name] MRN:  [id number] Principal Diagnosis: Alcohol withdrawal delirium Diagnosis:   Patient Active Problem List   Diagnosis Date Noted  . Delirium due to multiple etiologies [F05] 06/21/2014  . Idiopathic peripheral neuropathy [G60.9] 06/21/2014  . Non-sustained ventricular tachycardia [I47.2] 06/17/2014  . Alcohol withdrawal delirium [F10.231] 06/16/2014  . Ventral hernia without obstruction or gangrene [K43.9] 06/09/2014  . Aortic stenosis [I35.0] 07/13/2013  . Coronary artery disease [I25.10]   . Coronary atherosclerosis of native coronary artery [I25.10] 07/05/2013  . Essential hypertension, benign [I10] 07/05/2013  . Hyperlipidemia [E78.5] 07/05/2013  . Onychomycosis [B35.1] 02/11/2013    Total Time spent with patient: 20 minutes  Subjective:   Jesus [name] is a 81 y.o. male patient admitted with delirium.  HPI:  Normal Jesus [name] is a 81 year old male seen and chart reviewed for psychiatric consultation and evaluation. Patient family friends for at bedside who is able to contribute to this evaluation. Patient reports drinking regularly alcohol but denied history of withdrawal and cravings. Patient has cognitive decline due to multiple medication while in the hospital as per the family friends. Patient reportedly had a head near repair. Patient was found to be somewhat confused, forgetful and memory difficulties. Patient has no history of dementia or delirium before going to the surgery. Patient has responded fairly to most of the questions related to orientation, concentration but has difficulty with the memory. Patient son Jesus [name] is not at bedside during my visit. Reportedly patient son has a plans about moving in with his father if needed after discharge from the hospital. If not patient might need to do skilled  nursing facility with rehabilitation.   Interval history: Patient appeared sitting in a chair next to his bed and his son is at bed side. Reportedly he has been restless and agitated during last night but doing fine this morning. Patient was seen by palliative care and provided appropriate recommendation for his care. Patient has hard time to understand the conversation and found with confabulation. Reportedly patient might be drinking more than he was seen before hospitalized for hernia repair. Patient was known to be control and stubborn as per his son. Patient was not able to contribute to this evaluation because of significant cognitive decline and memory deficits.    Past Medical History:  Past Medical History  Diagnosis Date  . Aortic stenosis, mild   . Dyslipidemia   . Coronary artery disease     CABG 2005, repeat cath with severe 2 vessel ASCAD with high grade stenosis of LAD, patent LIMA to LAD, patent SVG too diag, mildly atretic but widely patent SVG to RCA and high grade stenosis 90% ostial left circ with left dominant with PDA coming off of the left circ s/p rotational atherectomy and cutting balloon PCI with stent to prox left circ  . Heart murmur dx'd 1948  . History of blood transfusion     "related to OR"  . GERD (gastroesophageal reflux disease)   . Duodenal ulcer 1949  . Arthritis     "fingers" (12/14/2013)  . Cellulitis of foot, left 12/13/2013  . Myocardial infarction     age 81   . Neuromuscular disorder     neuropathy left foot  . Nocturia   . Frequency   . Enlarged prostate   . Ventral hernia   . Cellulitis of  foot, left 12/13/2013  . Incisional hernia, without obstruction or gangrene 11/15/2013  . Pain in lower limb 07/15/2013  . Paronychia of great toe of left foot 11/12/2012    Past Surgical History  Procedure Laterality Date  . Prostate biopsy    . Cholecystectomy  1972  . Abdominal exploration surgery    . Cataract extraction w/ intraocular lens  implant,  bilateral Bilateral   . Appendectomy    . Coronary angioplasty with stent placement  12/18/2011    "1"  . Tonsillectomy    . Percutaneous coronary stent intervention (pci-s) N/A 12/18/2011    Procedure: PERCUTANEOUS CORONARY STENT INTERVENTION (PCI-S);  Surgeon: Corky Crafts, MD;  Location: [facility] CATH LAB;  Service: Cardiovascular;  Laterality: N/A;  . Carotid endarterectomy      rt side  . Coronary artery bypass graft  2003    "CABG X3"  . Ventral hernia repair N/A 06/09/2014    Procedure: LAPAROSCOPIC ASSISTED OPEN VENTRAL HERNIA REPAIR;  Surgeon: Ovidio Kin, MD;  Location: [facility] ORS;  Service: General;  Laterality: N/A;  WITH MESH  . Laparoscopic lysis of adhesions N/A 06/09/2014    Procedure: LAPAROSCOPIC LYSIS OF ADHESIONS;  Surgeon: Ovidio Kin, MD;  Location: [facility] ORS;  Service: General;  Laterality: N/A;   Family History:  Family History  Problem Relation Age of Onset  . Heart attack Father   . Arrhythmia Mother    Social History:  History  Alcohol Use  . 2.4 oz/week  . 4 Shots of liquor per week    Comment: 12/14/2013 "~ 4oz scotch/wk"     History  Drug Use No    History   Social History  . Marital Status: Widowed    Spouse Name: N/A    Number of Children: N/A  . Years of Education: N/A   Social History Main Topics  . Smoking status: Former Smoker -- 0.12 packs/day for 2 years    Types: Cigarettes    Quit date: 06/20/1979  . Smokeless tobacco: Never Used  . Alcohol Use: 2.4 oz/week    4 Shots of liquor per week     Comment: 12/14/2013 "~ 4oz scotch/wk"  . Drug Use: No  . Sexual Activity: No   Other Topics Concern  . None   Social History Narrative   Additional Social History:                          Allergies:   Allergies  Allergen Reactions  . Cardura [Doxazosin Mesylate]     Hypotenstion  . Gabapentin     Weight gain    Vitals: Blood pressure 101/68, pulse 65, temperature 98.3 F (36.8 C), temperature source Oral, resp. rate 16,  height  (1.778 m), weight 108.047 kg (238 lb 3.2 oz), SpO2 93 %.  Risk to Self: Is patient at risk for suicide?: No Risk to Others:   Prior Inpatient Therapy:   Prior Outpatient Therapy:    Current Facility-Administered Medications  Medication Dose Route Frequency Provider Last Rate Last Dose  . 0.9 %  sodium chloride infusion   Intravenous Continuous Edsel Petrin, DO 100 mL/hr at 06/22/14 1054    . acetaminophen (OFIRMEV) IV 1,000 mg  1,000 mg Intravenous 4 times per day Edsel Petrin, DO   1,000 mg at 06/22/14 0754  . aspirin tablet 325 mg  325 mg Oral Daily Edsel Petrin, DO   325 mg at 06/22/14 1054  . atenolol (  TENORMIN) tablet 50 mg  50 mg Oral Daily Edsel Petrin, DO   50 mg at 06/22/14 1054  . cefTRIAXone (ROCEPHIN) 1 g in dextrose 5 % 50 mL IVPB - Premix  1 g Intravenous Q24H Meredeth Ide, MD   1 g at 06/22/14 1232  . diazepam (VALIUM) injection 10 mg  10 mg Intravenous Q8H PRN Edsel Petrin, DO   10 mg at 06/22/14 0215  . diazepam (VALIUM) injection 5 mg  5 mg Intravenous BID Edsel Petrin, DO   5 mg at 06/22/14 1105  . enoxaparin (LOVENOX) injection 55 mg  0.5 mg/kg Subcutaneous Q24H Edsel Petrin, DO   55 mg at 06/21/14 2214  . fentaNYL (SUBLIMAZE) injection 12.5 mcg  12.5 mcg Intravenous Q4H PRN Edsel Petrin, DO      . folic acid injection 1 mg  1 mg Intravenous Daily Edsel Petrin, DO   1 mg at 06/22/14 1054  . haloperidol lactate (HALDOL) injection 0.5 mg  0.5 mg Intravenous 3 times per day Edsel Petrin, DO   0.5 mg at 06/22/14 0514  . senna-docusate (Senokot-S) tablet 2 tablet  2 tablet Oral QHS PRN Edsel Petrin, DO      . spiritus frumenti (ethyl alcohol) solution 1 each  1 each Oral PRN Edsel Petrin, DO      . tamsulosin [facility]) capsule 0.8 mg  0.8 mg Oral QHS Edsel Petrin, DO   0.8 mg at 06/21/14 2215  . thiamine (B-1) injection 100 mg  100 mg Intravenous Daily Ovidio Kin, MD   100  mg at 06/22/14 1054    Musculoskeletal: Strength & Muscle Tone: decreased Gait & Station: unable to stand Patient leans: N/A  Psychiatric Specialty Exam: Physical Exam as per history and physical  ROS confusion, anxiety, memory deficits  Blood pressure 101/68, pulse 65, temperature 98.3 F (36.8 C), temperature source Oral, resp. rate 16, height  (1.778 m), weight 108.047 kg (238 lb 3.2 oz), SpO2 93 %.Body mass index is 34.18 kg/(m^2).  General Appearance: Guarded  Eye Contact::  Fair  Speech:  Clear and Coherent  Volume:  Normal  Mood:  Irritable  Affect:  Appropriate and Congruent  Thought Process:  Coherent and Goal Directed  Orientation:  Full (Time, Place, and Person)  Thought Content:  WDL  Suicidal Thoughts:  No  Homicidal Thoughts:  No  Memory:  Immediate;   Fair Recent;   Poor  Judgement:  Fair  Insight:  Fair  Psychomotor Activity:  Decreased  Concentration:  Fair  Recall:  [employer] of Knowledge:Fair  Language: Good  Akathisia:  NA  Handed:  Right  AIMS (if indicated):     Assets:  Communication Skills Desire for Improvement Financial Resources/Insurance Housing Intimacy Leisure Time Resilience Social Support  ADL's:  Impaired  Cognition: Impaired,  Mild  Sleep:      Medical Decision Making: Review of Psycho-Social Stressors (1), Review or order clinical lab tests (1), Discuss test with performing physician (1), Review and summation of old records (2), New Problem, with no additional work-up planned (3), Review or order medicine tests (1), Review of Medication Regimen & Side Effects (2) and Review of New Medication or Change in Dosage (2)  Treatment Plan Summary: Daily contact with patient to assess and evaluate symptoms and progress in treatment and Medication management  Plan: Psychiatric consultation will sign off at this time Continue current medication treatment and discontinue Librium 25 mg BID -  no alcohol withdrawal  Minimize or  discontinue sedating medication including opiates to prevent falls and cognitive decline No evidence of imminent risk to self or others at present.   Patient does not meet criteria for psychiatric inpatient admission.   Disposition: Patient can be discharged to home if home health is allowable otherwise may need SNF with memory unit and rehab treatment.   Sherrick Araki,JANARDHAHA R. 06/22/2014 2:49 PM

## 2014-06-22 NOTE — Progress Notes (Addendum)
Patient awake, incontinent of urine this time, patient with increased agitation as staff members provided care. Patient did attempt to hit at staff. Son remains at bedside.

## 2014-06-22 NOTE — Progress Notes (Signed)
CSW continuing to follow.  CSW visited with Jesus [name] and Jesus [name], Sharma Covertorman at bedside. Sitter remains at bedside. Jesus [name] discussed that Jesus [name] has been up in the chair most of the day and participated with Jesus [name] today. Jesus [name] alert, but disoriented. Jesus [name] discussed that Jesus [name] had a difficult night and Jesus [name] is hopeful that Jesus [name] being awake most of today that Jesus [name] will be able to sleep tonight.   CSW discussed that CSW will continue to follow and provide rehab at SNF bed offers when appropriate.  CSW to continue to follow to provide support and assist with Jesus [name] disposition planning.  Loletta SpecterSuzanna Kidd, MSW, LCSW Clinical Social Work [phone]

## 2014-06-22 NOTE — Progress Notes (Signed)
[facility] PROGRESS NOTE  GIANCARLO ASKREN ZOX:[id number] DOB: Oct 23, 1928 DOA: 06/09/2014 PCP: Miguel Aschoff, MD  Assessment/Plan: 1. Postop delirium- patient underwent laparoscopic assisted open ventral hernia repair on 06/09/2014, and developed persistent postop delirium. There was a question of possible alcohol use at home so patient was started on CIWA protocol. Despite being on benzodiazepines, including Ativan, Librium, Haldol when necessary, patient has been persistently in delirium, with worsening of agitation at nighttime. Patient does not have history of dementia as per family. Palliative care was consulted, and at this time patient has been started on diazepam along with when necessary Haldol. Patient also found to have mildly abnormal UA and started on IV Rocephin. Urine culture results are pending at this time. 2. Hypertension- Catapres patch has been discontinued as per palliative care recommendation, atenolol dose increased to 50 mg by mouth daily. Blood pressure has been stable. 3. UTI- as above patient has mildly abnormal UA which showed moderate WBC and patient started empirically on IV Rocephin. We'll follow the urine culture results. 4. BPH- continue tamsulosin 0.8 mg daily bedtime.  Code Status: Full code Family Communication: Spoke to son at bedside   Antibiotics:  Rocephin 1/27  HPI/Subjective:  Patient was admitted for elective ventral hernia repair and developed persistent delirium postop. [facility] service was consulted on 06/16/2014, at that time it was thought that patient had alcohol withdrawal delirium so was started on CIWA protocol. Patient also was started on Haldol when necessary. He was seen by psychiatry at that time they switched him to Librium 25 mg twice a day. But after starting Librium patient has become more agitated and confused, [facility] service was again consulted for further recommendation. Palliative care was consulted for the  management of delirium. Patient started on diazepam and prn haldol. Started on Ceftriaxone for the UTI.  He continues to have agitation especially at night, at this time he is alert  but calm.  Objective: Filed Vitals:   06/22/14 0526  BP: 107/59  Pulse: 54  Temp: 98.3 F (36.8 C)  Resp: 16    Intake/Output Summary (Last 24 hours) at 06/22/14 1231 Last data filed at 06/22/14 0743  Gross per 24 hour  Intake    400 ml  Output    109 ml  Net    291 ml   Filed Weights   06/16/14 0418 06/18/14 0500 06/19/14 0400  Weight: 111.4 kg (245 lb 9.5 oz) 111.358 kg (245 lb 8 oz) 108.047 kg (238 lb 3.2 oz)    Exam:  Physical Exam: Eyes: No icterus, extraocular muscles intact  Lungs: Normal respiratory effort, bilateral clear to auscultation, no crackles or wheezes.  Heart: Regular rate and rhythm, S1 and S2 normal, no murmurs, rubs auscultated Abdomen: BS normoactive,soft,nondistended,non-tender to palpation,no organomegaly Extremities: No pretibial edema, no erythema, no cyanosis, no clubbing Neuro : Alert and confused   Data Reviewed: Basic Metabolic Panel:  Recent Labs Lab 06/16/14 0920 06/17/14 1038 06/18/14 0347 06/19/14 0410 06/20/14 0410 06/22/14 1020  NA 141  --  143 140 142 141  K 4.2  --  4.3 4.0 3.9 3.8  CL 108  --  109 105 106 105  CO2 25  --  GLUCOSE 111*  --  126* 109* 103* 121*  BUN 10  --  CREATININE 1.06  --  1.20 1.12 1.15 1.32  CALCIUM 8.7  --  8.5 8.7 8.9 8.8  MG  --  1.6  --   --   --  1.8  PHOS  --  3.6  --   --   --   --    Liver Function Tests:  Recent Labs Lab 06/22/14 1020  AST 62*  ALT 62*  ALKPHOS 60  BILITOT 0.9  PROT 6.4  ALBUMIN 3.1*   No results for input(s): LIPASE, AMYLASE in the last 168 hours. No results for input(s): AMMONIA in the last 168 hours. CBC:  Recent Labs Lab 06/16/14 0920 06/17/14 1038 06/22/14 1020  WBC 6.7 6.4 10.5  NEUTROABS 4.6 4.4  --   HGB 11.4* 10.9* 12.5*  HCT 35.1*  34.0* 38.3*  MCV 98.9 99.1 97.5  PLT 258 285 356   Cardiac Enzymes: No results for input(s): CKTOTAL, CKMB, CKMBINDEX, TROPONINI in the last 168 hours. BNP (last 3 results) No results for input(s): PROBNP in the last 8760 hours. CBG: No results for input(s): GLUCAP in the last 168 hours.  No results found for this or any previous visit (from the past 240 hour(s)).   Studies: Ct Head Wo Contrast  06/21/2014   CLINICAL DATA:  Delirium. Confusion, combative. Does not answer questions.  EXAM: CT HEAD WITHOUT CONTRAST  TECHNIQUE: Contiguous axial images were obtained from the base of the skull through the vertex without intravenous contrast.  COMPARISON:  None.  FINDINGS: Prominence of the sulci and ventricles are noted. There is mild low attenuation throughout the subcortical and periventricular white matter compatible with chronic microvascular disease.  No evidence for acute intracranial hemorrhage, cortical infarct, or mass. No abnormal extra-axial fluid collections.  The paranasal sinuses are clear. The mastoid air cells are clear. The skull is intact.  IMPRESSION: 1. No acute intracranial abnormalities. 2. Small vessel ischemic disease and brain atrophy.   Electronically Signed   By: Signa Kellaylor  Stroud M.D.   On: 06/21/2014 18:21    Scheduled Meds: . acetaminophen  1,000 mg Intravenous 4 times per day  . aspirin  325 mg Oral Daily  . atenolol  50 mg Oral Daily  . cefTRIAXone (ROCEPHIN)  IV  1 g Intravenous Q24H  . diazepam  5 mg Intravenous BID  . enoxaparin (LOVENOX) injection  0.5 mg/kg Subcutaneous Q24H  . folic acid  1 mg Intravenous Daily  . haloperidol lactate  0.5 mg Intravenous 3 times per day  . tamsulosin  0.8 mg Oral QHS  . thiamine IV  100 mg Intravenous Daily   Continuous Infusions: . sodium chloride 100 mL/hr at 06/22/14 1054    Principal Problem:   Alcohol withdrawal delirium Active Problems:   Essential hypertension, benign   Hyperlipidemia   Coronary artery  disease   Ventral hernia without obstruction or gangrene   Non-sustained ventricular tachycardia   Delirium due to multiple etiologies   Idiopathic peripheral neuropathy    Time spent: *20 min    [facility] HospitalAMA,Erwin Nishiyama S  [facility] Pager [phone] If 7PM-7AM, please contact night-coverage at [email or web address], password [email or web address] 06/22/2014, 12:31 PM  LOS: 13 days

## 2014-06-22 NOTE — Progress Notes (Signed)
Physical Therapy Treatment Patient Details Name: Jesus [name] MRN: [id number] DOB: 1928-10-23 Today's Date: 06/22/2014    History of Present Illness 81 yo male admitted 06/10/14 for hernia repair. 1/18 began AMS/agitation.    PT Comments    Pt OOB in recliner with son in room and sitter.  Son assisted during session with transfers and amb.  Pt slow to respond/groggy but alert and following commands.  Amb with EVA walker for increased support as pt has had an extended hospital stay and very deconditioned.   Son reports pt was Indep/driving/golfing and walking 2 miles a day at the gym.   Follow Up Recommendations  SNF (family declines rec and plan to take pt home and provide 24/7 care.)     Equipment Recommendations  None recommended by PT    Recommendations for Other Services       Precautions / Restrictions Precautions Precautions: Fall Precaution Comments: ABD binder Restrictions Weight Bearing Restrictions: No    Mobility  Bed Mobility               General bed mobility comments: Pt OOB in recliner  Transfers Overall transfer level: Needs assistance Equipment used: Bilateral platform walker (EVA walker) Transfers: Sit to/from Stand Sit to Stand: +2 physical assistance;Mod assist   Squat pivot transfers: Max assist;+2 physical assistance     General transfer comment: 100% VC's on proper hand placement and repeat cueing to stay on task.  Able to partially rise then required asssit to complete and increase upright posture.  Stand to sit required 100% hand over hand cueing to let go of walker and place back on chair.  Uncontrolled decend "plop".   Ambulation/Gait Ambulation/Gait assistance: Mod assist;+2 physical assistance Ambulation Distance (Feet): 350 Feet Assistive device: Bilateral platform walker (EVA walker) Gait Pattern/deviations: Step-to pattern;Step-through pattern;Decreased stride length;Decreased step length - right;Decreased step length -  left;Trunk flexed;Narrow base of support;Shuffle Gait velocity: decreased   General Gait Details: used EVA walker for increased support due to extended length of stay and overall weakness.  Pt required assist to properly advancing walker and assist for lateral stability.  Son present and assisted on pt's L side as we amb in hallway.  No c/o pain.  Limited activity tolerance.  Unsteady gait.  HIGH FALL RISK.   Stairs            Wheelchair Mobility    Modified Rankin (Stroke Patients Only)       Balance Overall balance assessment: Needs assistance Sitting-balance support: Feet supported;Bilateral upper extremity supported Sitting balance-Leahy Scale: Fair     Standing balance support: Bilateral upper extremity supported Standing balance-Leahy Scale: Fair                      Cognition Arousal/Alertness: Awake/alert Behavior During Therapy: Restless Overall Cognitive Status: Impaired/Different from baseline (stated son) Area of Impairment: Orientation;Attention;Memory;Following commands;Safety/judgement;Awareness;Problem solving Orientation Level: Disoriented to;Time;Situation Current Attention Level: Alternating Memory: Decreased short-term memory Following Commands: Follows one step commands inconsistently Safety/Judgement: Decreased awareness of safety   Problem Solving: Slow processing;Requires verbal cues;Requires tactile cues General Comments: plus HOH    Exercises      General Comments        Pertinent Vitals/Pain Pain Assessment: No/denies pain    Home Living Family/patient expects to be discharged to:: Private residence Living Arrangements: Alone Available Help at Discharge: Family Type of Home: House     Home Layout: One level   Additional Comments: has son  and other  family. son states caregivers will be hired    Prior Function Level of Independence: Independent          PT Goals (current goals can now be found in the care plan  section) Acute Rehab PT Goals Patient Stated Goal: son wants pt to get home Progress towards PT goals: Progressing toward goals    Frequency  Min 3X/week    PT Plan      Co-evaluation             End of Session Equipment Utilized During Treatment: Gait belt Activity Tolerance: Patient limited by fatigue Patient left: in chair;with call bell/phone within reach;with family/visitor present;with nursing/sitter in room     Time: 1330-1400 PT Time Calculation (min) (ACUTE ONLY): 30 min  Charges:  $Gait Training: 8-22 mins $Therapeutic Activity: 8-22 mins                    G Codes:      Felecia ShellingLori Zoye Chandra  PTA [facility]  Acute  Rehab Pager      [phone]
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
## 2014-06-22 NOTE — Progress Notes (Signed)
Patient is asleep at this time, respirations even non labored. Bed alarm on.

## 2014-06-22 NOTE — Evaluation (Signed)
Occupational Therapy Evaluation Patient Details Name: Jesus [name] MRN: [id number] DOB: 05/23/1929 Today's Date: 06/22/2014    History of Present Illness 81 yo male admitted 06/10/14 for hernia repair. 1/18 began AMS/agitation.   Clinical Impression   Pt admitted with hernia repair and developed AMS. Pt currently with functional limitations due to the deficits listed below (see OT Problem List).  Pt will benefit from skilled OT to increase their safety and independence with ADL and functional mobility for ADL to facilitate discharge to venue listed below.     Follow Up Recommendations  Home health OT;SNF;Supervision/Assistance - 24 hour    Equipment Recommendations  Other (comment);3 in 1 bedside comode (TBD)    Recommendations for Other Services       Precautions / Restrictions Precautions Precautions: Fall Precaution Comments: ABD binder Restrictions Weight Bearing Restrictions: No      Mobility Bed Mobility               General bed mobility comments: pt sit up EOB  Transfers Overall transfer level: Needs assistance Equipment used: Rolling walker (2 wheeled) Transfers: Sit to/from Stand Sit to Stand: +2 physical assistance;+2 safety/equipment;Mod assist   Squat pivot transfers: +2 safety/equipment;+2 physical assistance;Mod assist     General transfer comment: son present. pt did not follow commands consistently which was challenging - but with 2nd person able to go to bathroom and back. Pt did lose balance posterior several times and needed time to self corrrect    Balance Overall balance assessment: Needs assistance Sitting-balance support: Feet supported;Bilateral upper extremity supported Sitting balance-Leahy Scale: Fair     Standing balance support: Bilateral upper extremity supported Standing balance-Leahy Scale: Fair                              ADL Overall ADL's : Needs assistance/impaired                         Toilet  Transfer: +2 for safety/equipment;Moderate assistance;RW;Comfort height toilet;Regular Toilet   Toileting- Clothing Manipulation and Hygiene: Sit to/from stand;+2 for safety/equipment;Moderate assistance;Cueing for sequencing;Cueing for safety       Functional mobility during ADLs: Rolling walker;+2 for safety/equipment;Moderate assistance       Vision                            Pertinent Vitals/Pain Pain Assessment: No/denies pain     Hand Dominance     Extremity/Trunk Assessment Upper Extremity Assessment Upper Extremity Assessment: Generalized weakness           Communication Communication Communication: Expressive difficulties;Receptive difficulties   Cognition Arousal/Alertness: Awake/alert Behavior During Therapy: Restless Overall Cognitive Status: Impaired/Different from baseline Area of Impairment: Orientation;Attention;Memory;Following commands;Safety/judgement;Awareness;Problem solving Orientation Level: Disoriented to;Place;Time;Situation   Memory: Decreased recall of precautions;Decreased short-term memory Following Commands: Follows one step commands inconsistently Safety/Judgement: Decreased awareness of safety   Problem Solving: Slow processing;Requires verbal cues;Requires tactile cues     General Comments   Educated on son on VC for pt in regards to cueing pt- but also helping him remember when he can. Talking about familiar topics, looking at pictures, etc            Home Living Family/patient expects to be discharged to:: Private residence Living Arrangements: Alone Available Help at Discharge: Family Type of Home: House       Home Layout: One level  Additional Comments: has son  and other family. son states caregivers will be hired      Prior Functioning/Environment Level of Independence: Independent             OT Diagnosis: Cognitive deficits;Generalized weakness;Altered mental status   OT  Problem List: Decreased strength;Decreased activity tolerance;Impaired balance (sitting and/or standing);Decreased knowledge of precautions;Decreased cognition;Decreased safety awareness   OT Treatment/Interventions: Self-care/ADL training;Cognitive remediation/compensation    OT Goals(Current goals can be found in the care plan section) Acute Rehab OT Goals Patient Stated Goal: son wants pt to get home Time For Goal Achievement: 07/30/14 Potential to Achieve Goals: Good  OT Frequency: Min 2X/week   Barriers to D/C:               End of Session Nurse Communication: Mobility status;Precautions  Activity Tolerance: Patient tolerated treatment well Patient left: in chair;with family/visitor present;with nursing/sitter in room   Time: 1010-1059 OT Time Calculation (min): 49 min Charges:  OT General Charges $OT Visit: 1 Procedure OT Evaluation $Initial OT Evaluation Tier I: 1 Procedure OT Treatments $Self Care/Home Management : 23-37 mins G-Codes:    Einar Crow D July 22, 2014, 11:35 AM

## 2014-06-22 NOTE — Progress Notes (Signed)
General Surgery Note  LOS: 13 days  POD -  13 Days Post-Op  Assessment/Plan: 1.  LAPAROSCOPIC ASSISTED OPEN VENTRAL HERNIA REPAIR, LAPAROSCOPIC LYSIS OF ADHESIONS - 06/09/2014 - D. Jesus [name]  Stable   2.  Confusion - dementia vs alcohol withdrawal vs both  On 3W   CT of head - no acute injury, some atrophy    switched to Valium  BID and haldol 0.5 mg Q 8 hr  Appreciate multiple consultants help - Jesus [name]/Jesus [name]/Jesus [name]  He still had another bad night last PM.  When I saw him this morning, her was alert but disoriented.  [Family has found Geriatric MD Jesus [name] [phone] to help with discharge planning]   3. HTN 4. CAD CABG - 2005 Stent placed about 2010. 08/01/2013 - Nuc myocardial perfusion - Moderate inferolateral and anterolateral defect consistent with scar and soft tissue attnenuation with minimal periinfarct ischemia. LV Ejection Fraction: 47%. LV Wall Motion: Lateral hypokinesis.  Dr. Armanda [name] sent a note 12/12/2013 that said he is low risk for surgery.  Had some tachycardias last PM.  Responded to beta blocker. 5. Neuropathy - Both feet, etiology unclear. 6. BPH -sees Dr. Ranelle [name]  On Rocephin for possible UTI - urine culture pending  On Flomax 7. GERD, but stopped meds about one year ago. 8. Heart murmur 9.  DVT prophylaxis - On Lovenox   Principal Problem:   Alcohol withdrawal delirium Active Problems:   Essential hypertension, benign   Hyperlipidemia   Coronary artery disease   Ventral hernia without obstruction or gangrene   Non-sustained ventricular tachycardia   Delirium due to multiple etiologies   Idiopathic peripheral neuropathy  Subjective:  Bad night, better this AM.  I was in the room with Jesus [name].  Jesus [name] (from [place]) in room.  I spent about 20 minutes going over plan.  He is happy with Dr. Lamar [name] recommendations.  Objective:   Filed Vitals:   06/22/14 0526  BP: 107/59  Pulse: 54  Temp:  98.3 F (36.8 C)  Resp: 16     Intake/Output from previous day:  01/27 0701 - 01/28 0700 In: 460 [P.O.:360; IV Piggyback:100] Out: 559 [Urine:557; Stool:2]  Intake/Output this shift:      Physical Exam:   General: [name].  Alert, but disoriented to place and time.  He is relatively calm right now, but apparently was not that way last PM.   HEENT: Normal. Pupils equal. .   Lungs: Clear.     Abdomen: BS present.  Soft.   Wound: Dressed.     Lab Results:    No results for input(s): WBC, HGB, HCT, PLT in the last 72 hours.  BMET    Recent Labs  06/20/14 0410  NA 142  K 3.9  CL 106  CO2 28  GLUCOSE 103*  BUN 10  CREATININE 1.15  CALCIUM 8.9    PT/INR  No results for input(s): LABPROT, INR in the last 72 hours.  ABG  No results for input(s): PHART, HCO3 in the last 72 hours.  Invalid input(s): PCO2, PO2   Studies/Results:  Ct Head Wo Contrast  06/21/2014   CLINICAL DATA:  Delirium. Confusion, combative. Does not answer questions.  EXAM: CT HEAD WITHOUT CONTRAST  TECHNIQUE: Contiguous axial images were obtained from the base of the skull through the vertex without intravenous contrast.  COMPARISON:  None.  FINDINGS: Prominence of the sulci and ventricles are noted. There is mild low attenuation throughout the subcortical and periventricular white matter  compatible with chronic microvascular disease.  No evidence for acute intracranial hemorrhage, cortical infarct, or mass. No abnormal extra-axial fluid collections.  The paranasal sinuses are clear. The mastoid air cells are clear. The skull is intact.  IMPRESSION: 1. No acute intracranial abnormalities. 2. Small vessel ischemic disease and brain atrophy.   Electronically Signed   By: Signa Kellaylor  Stroud M.D.   On: 06/21/2014 18:21     Anti-infectives:   Anti-infectives    Start     Dose/Rate Route Frequency Ordered Stop   06/21/14 1300  cefTRIAXone (ROCEPHIN) 1 g in dextrose 5 % 50 mL IVPB - Premix     1 g100 mL/hr over  30 Minutes Intravenous Every 24 hours 06/21/14 1239     06/09/14 0719  ceFAZolin (ANCEF) IVPB 2 g/50 mL premix     2 g100 mL/hr over 30 Minutes Intravenous On call to O.R. 06/09/14 0719 06/09/14 0930      Jesus Kinavid Vin Yonke, MD, FACS Pager: [phone] [facility] Office: [phone] 06/22/2014

## 2014-06-23 DIAGNOSIS — F05 Delirium due to known physiological condition: Secondary | ICD-10-CM

## 2014-06-23 DIAGNOSIS — Z66 Do not resuscitate: Secondary | ICD-10-CM | POA: Diagnosis present

## 2014-06-23 DIAGNOSIS — Z515 Encounter for palliative care: Secondary | ICD-10-CM

## 2014-06-23 LAB — HEMOGLOBIN A1C
Hgb A1c MFr Bld: 5.3 % (ref 4.8–5.6)
MEAN PLASMA GLUCOSE: 105 mg/dL

## 2014-06-23 MED ORDER — ACETAMINOPHEN 325 MG PO TABS
650.0000 mg | ORAL_TABLET | Freq: Four times a day (QID) | ORAL | Status: DC
  Administered 2014-06-23: 650 mg via ORAL
  Filled 2014-06-23: qty 2

## 2014-06-23 MED ORDER — HALOPERIDOL LACTATE 2 MG/ML PO CONC
1.0000 mg | Freq: Four times a day (QID) | ORAL | Status: DC | PRN
  Filled 2014-06-23: qty 0.5

## 2014-06-23 MED ORDER — SPIRITUS FRUMENTI: 1.0000 | ORAL | Status: DC | PRN

## 2014-06-23 MED ORDER — TAMSULOSIN HCL 0.4 MG PO CAPS: 0.8000 mg | ORAL_CAPSULE | Freq: Every day | ORAL | Status: DC

## 2014-06-23 MED ORDER — LIDOCAINE 5 % EX PTCH: 1.0000 | MEDICATED_PATCH | CUTANEOUS | Status: DC

## 2014-06-23 MED ORDER — DIAZEPAM 5 MG PO TABS: 5.0000 mg | ORAL_TABLET | Freq: Every evening | ORAL | Status: DC | PRN

## 2014-06-23 MED ORDER — ATENOLOL 25 MG PO TABS
25.0000 mg | ORAL_TABLET | Freq: Every day | ORAL | Status: DC
  Administered 2014-06-23: 25 mg via ORAL
  Filled 2014-06-23: qty 1

## 2014-06-23 MED ORDER — ASPIRIN 325 MG PO TABS: 325.0000 mg | ORAL_TABLET | Freq: Every day | ORAL | Status: DC

## 2014-06-23 MED ORDER — DICLOFENAC EPOLAMINE 1.3 % TD PTCH: 1.0000 | MEDICATED_PATCH | Freq: Two times a day (BID) | TRANSDERMAL | Status: DC

## 2014-06-23 MED ORDER — DIAZEPAM 5 MG PO TABS: 5.0000 mg | ORAL_TABLET | ORAL | Status: DC | PRN

## 2014-06-23 MED ORDER — OXYCODONE HCL 5 MG PO TABS: 2.5000 mg | ORAL_TABLET | ORAL | Status: DC | PRN

## 2014-06-23 MED ORDER — HALOPERIDOL LACTATE 2 MG/ML PO CONC: 1.0000 mg | Freq: Four times a day (QID) | ORAL | Status: DC | PRN

## 2014-06-23 MED ORDER — FOLIC ACID 1 MG PO TABS
1.0000 mg | ORAL_TABLET | Freq: Every day | ORAL | Status: DC
  Administered 2014-06-23: 1 mg via ORAL
  Filled 2014-06-23: qty 1

## 2014-06-23 MED ORDER — ATENOLOL 25 MG PO TABS: 25.0000 mg | ORAL_TABLET | Freq: Every day | ORAL | Status: DC

## 2014-06-23 MED ORDER — FOLIC ACID 1 MG PO TABS: 1.0000 mg | ORAL_TABLET | Freq: Every day | ORAL | Status: DC

## 2014-06-23 MED ORDER — OXYCODONE HCL 5 MG PO TABS
2.5000 mg | ORAL_TABLET | ORAL | Status: DC | PRN
  Administered 2014-06-23: 2.5 mg via ORAL
  Filled 2014-06-23: qty 1

## 2014-06-23 MED ORDER — SENNOSIDES-DOCUSATE SODIUM 8.6-50 MG PO TABS: 1.0000 | ORAL_TABLET | Freq: Every day | ORAL | Status: DC

## 2014-06-23 MED ORDER — LEVOFLOXACIN 500 MG PO TABS
500.0000 mg | ORAL_TABLET | Freq: Every day | ORAL | Status: DC
  Administered 2014-06-23: 500 mg via ORAL
  Filled 2014-06-23: qty 1

## 2014-06-23 MED ORDER — ACETAMINOPHEN 325 MG PO TABS: 650.0000 mg | ORAL_TABLET | Freq: Four times a day (QID) | ORAL | Status: DC

## 2014-06-23 MED ORDER — DICLOFENAC EPOLAMINE 1.3 % TD PTCH
1.0000 | MEDICATED_PATCH | Freq: Two times a day (BID) | TRANSDERMAL | Status: DC
  Administered 2014-06-23: 1 via TRANSDERMAL
  Filled 2014-06-23 (×2): qty 1

## 2014-06-23 MED ORDER — FENTANYL CITRATE 0.05 MG/ML IJ SOLN: 12.5000 ug | INTRAMUSCULAR | Status: DC | PRN

## 2014-06-23 MED ORDER — THIAMINE HCL 100 MG PO TABS: 100.0000 mg | ORAL_TABLET | Freq: Every day | ORAL | Status: DC

## 2014-06-23 MED ORDER — VITAMIN B-1 100 MG PO TABS
100.0000 mg | ORAL_TABLET | Freq: Every day | ORAL | Status: DC
  Administered 2014-06-23: 100 mg via ORAL
  Filled 2014-06-23: qty 1

## 2014-06-23 MED ORDER — LEVOFLOXACIN 500 MG PO TABS: 500.0000 mg | ORAL_TABLET | Freq: Every day | ORAL | Status: DC

## 2014-06-23 NOTE — Progress Notes (Addendum)
CSW continuing to follow.   CSW reviewed chart and noted that Dr. Hilma Favors met with pt family again and pt family planning for pt to return home with home health services and private duty care and family support.   CSW and RNCM met with pt friend, Kennyth Lose in hallway. Pt friend confirmed that pt family wishes for pt to discharge to home with home health; private duty care and family support. RNCM to further discuss details regarding home care with pt friend, Kennyth Lose.  CSW to continue to follow to provide support and be available if pt needs ambulance transport home.  CSW to continue to follow.   Addendum 4:47 pm:  Pt discharging home today. CSW inquired with RN if pt will need ambulance transport and RN reports that pt was ambulating with assist in the hallway and does not anticipate ambulance transport needed.  No further social work needs identified.   CSW signing off.   Alison Murray, MSW, [employer] [phone]

## 2014-06-23 NOTE — Progress Notes (Signed)
Physical Therapy Treatment Patient Details Name: Otho Perlorman F Ante MRN: [id number] DOB: 10/05/1928 Today's Date: 06/23/2014    History of Present Illness 81 yo male admitted 79/16/16 for hernia repair. 1/18 began AMS/agitation.    PT Comments    Pt sitting EOB with sitter in room trying to stand up with assist and without pants on.  Assisted with redirection, application of gowns and ABD binder.  Placed binder on pt backwards such that he will be unable to remove.  Assisted with amb pt in hallway a great distance with RW.  Still unsteady and HIGH FALL RISK due to impaired cognition and poor judgement.   Follow Up Recommendations  Home health PT (family plans to take pt home)     Equipment Recommendations  None recommended by PT    Recommendations for Other Services       Precautions / Restrictions Precautions Precautions: Fall Precaution Comments: ABD binder for 4 weeks from surgery Restrictions Weight Bearing Restrictions: No    Mobility  Bed Mobility               General bed mobility comments: Pt sitting EOB on arrival  Transfers Overall transfer level: Needs assistance Equipment used: Rolling walker (2 wheeled) Transfers: Sit to/from Stand Sit to Stand: Min guard;Min assist         General transfer comment: pt impulsive trying to stand from EOB.  Sitter in room.  pt able to self rise.  Impaired safety cognition. HIGH FALL RISK.  Ambulation/Gait Ambulation/Gait assistance: Min guard;Min assist Ambulation Distance (Feet): 500 Feet Assistive device: Rolling walker (2 wheeled) Gait Pattern/deviations: Step-through pattern;Decreased stride length;Trunk flexed Gait velocity: decreased   General Gait Details: amb with RW this session pt tolerated well. still unsteady and impaired cognition.  HIGH FALL RISK.     Stairs            Wheelchair Mobility    Modified Rankin (Stroke Patients Only)       Balance                                     Cognition                            Exercises      General Comments        Pertinent Vitals/Pain Pain Assessment: No/denies pain    Home Living                      Prior Function            PT Goals (current goals can now be found in the care plan section) Progress towards PT goals: Progressing toward goals    Frequency  Min 3X/week    PT Plan      Co-evaluation             End of Session Equipment Utilized During Treatment: Gait belt Activity Tolerance: Patient tolerated treatment well Patient left: in chair;with call bell/phone within reach;with family/visitor present;with nursing/sitter in room     Time: 1222-1238 PT Time Calculation (min) (ACUTE ONLY): 16 min  Charges:  $Gait Training: 8-22 mins                    G Codes:      Felecia ShellingLori Jream Broyles  PTA [facility]  Acute  Rehab Pager  [phone]  

## 2014-06-23 NOTE — Progress Notes (Signed)
General Surgery Note  LOS: 14 days  POD -  14 Days Post-Op  Assessment/Plan: 1.  LAPAROSCOPIC ASSISTED OPEN VENTRAL HERNIA REPAIR, LAPAROSCOPIC LYSIS OF ADHESIONS - 06/09/2014 - D. Mariell Nester  To remove staples today - leave sutures  I would like for him to wear the abdominal binder for at least 4 weeks from the date of surgery - he keeps taking it off   2.  Confusion - dementia vs alcohol withdrawal vs both  On 3W   Still significantly confused at night.  There is not enough daylight to see how he will do today.  Seen by Dr. Phillips Odor just a few minutes ago.  She thinks that he will do better at home, but outlines the risks.  She plans to meet with the family this PM.  Valium  BID and haldol 0.5 mg Q 8 hr.  Plans are to switch this to po meds.  It is unclear if he will be ready to go home today - will let Dr. Lamar Blinks discussion with family guide this - but he is okay to discharge from my standpoint   [Family has found Geriatric MD Erick Blinks [phone] to help with discharge planning]   3. HTN 4. CAD CABG - 2005 Stent placed about 2010. 08/01/2013 - Nuc myocardial perfusion - Moderate inferolateral and anterolateral defect consistent with scar and soft tissue attnenuation with minimal periinfarct ischemia. LV Ejection Fraction: 47%. LV Wall Motion: Lateral hypokinesis.  Dr. Armanda Magic sent a note 12/12/2013 that said he is low risk for surgery.  Had some tachycardias last PM.  Responded to beta blocker. 5. Neuropathy - Both feet, etiology unclear. 6. BPH -sees Dr. Ranelle Oyster  On Rocephin for possible UTI - multiple colonies on cultures - no one organism identified  On Flomax 7. GERD, but stopped meds about one year ago. 8. Heart murmur 9.  DVT prophylaxis - On Lovenox   Principal Problem:   Alcohol withdrawal delirium Active Problems:   Essential hypertension, benign   Hyperlipidemia   Coronary artery disease   Ventral hernia without  obstruction or gangrene   Non-sustained ventricular tachycardia   Delirium due to multiple etiologies   Idiopathic peripheral neuropathy  Subjective:  Confused and disoriented, but conversant.  [name], spent the night with him last PM.  Objective:   Filed Vitals:   06/23/14 0653  BP: 112/59  Pulse: 66  Temp: 97.6 F (36.4 C)  Resp: 18     Intake/Output from previous day:  01/28 0701 - 01/29 0700 In: 420 [P.O.:420] Out: 300 [Urine:300]  Intake/Output this shift:      Physical Exam:   General: [name].  Alert, but disoriented to place and time.     HEENT: Normal. Pupils equal. .   Lungs: Clear.     Abdomen: BS present.  Soft.   Wound: Clean. To remove staples today.   Lab Results:     Recent Labs  06/22/14 1020  WBC 10.5  HGB 12.5*  HCT 38.3*  PLT 356    BMET    Recent Labs  06/22/14 1020  NA 141  K 3.8  CL 105  CO2 26  GLUCOSE 121*  BUN 15  CREATININE 1.32  CALCIUM 8.8    PT/INR  No results for input(s): LABPROT, INR in the last 72 hours.  ABG  No results for input(s): PHART, HCO3 in the last 72 hours.  Invalid input(s): PCO2, PO2   Studies/Results:  Ct Head Wo Contrast  06/21/2014   CLINICAL DATA:  Delirium. Confusion, combative. Does not answer questions.  EXAM: CT HEAD WITHOUT CONTRAST  TECHNIQUE: Contiguous axial images were obtained from the base of the skull through the vertex without intravenous contrast.  COMPARISON:  None.  FINDINGS: Prominence of the sulci and ventricles are noted. There is mild low attenuation throughout the subcortical and periventricular white matter compatible with chronic microvascular disease.  No evidence for acute intracranial hemorrhage, cortical infarct, or mass. No abnormal extra-axial fluid collections.  The paranasal sinuses are clear. The mastoid air cells are clear. The skull is intact.  IMPRESSION: 1. No acute intracranial abnormalities. 2. Small vessel ischemic disease and brain atrophy.    Electronically Signed   By: Signa Kellaylor  Stroud M.D.   On: 06/21/2014 18:21     Anti-infectives:   Anti-infectives    Start     Dose/Rate Route Frequency Ordered Stop   06/21/14 1300  cefTRIAXone (ROCEPHIN) 1 g in dextrose 5 % 50 mL IVPB - Premix     1 g100 mL/hr over 30 Minutes Intravenous Every 24 hours 06/21/14 1239     06/09/14 0719  ceFAZolin (ANCEF) IVPB 2 g/50 mL premix     2 g100 mL/hr over 30 Minutes Intravenous On call to O.R. 06/09/14 0719 06/09/14 0930      Jesus Kinavid Vedanth Sirico, MD, FACS Pager: [phone] [facility] Office: [phone] 06/23/2014

## 2014-06-23 NOTE — Progress Notes (Signed)
[facility] PROGRESS NOTE  Jesus [name] HYQ:[id number] DOB: 08/11/1928 DOA: 06/09/2014 PCP: Miguel AschoffOSS,ALLAN, MD  Assessment/Plan: 1. Postop delirium- patient underwent laparoscopic assisted open ventral hernia repair on 06/09/2014, and developed persistent postop delirium. There was a question of possible alcohol use at home so patient was started on CIWA protocol. Despite being on benzodiazepines, including Ativan, Librium, Haldol when necessary, patient has been persistently in delirium, with worsening of agitation at nighttime. Patient does not have history of dementia as per family. Palliative care was consulted, and at this time patient has been started on diazepam along with when necessary Haldol. Patient also found to have mildly abnormal UA and started on IV Rocephin. Urine culture results grew morphophytes, will discontinue Rocephin at this time. 2. Hypertension- Catapres patch has been discontinued as per palliative care recommendation, atenolol dose increased to 50 mg by mouth daily. Blood pressure has been stable. 3. UTI- as above patient has mildly abnormal UA which showed moderate WBC and patient started empirically on IV Rocephin. Urine culture only grew contaminants, will discontinue Rocephin 4. BPH- continue tamsulosin 0.8 mg daily bedtime.  Code Status: Full code Family Communication: Spoke to son at bedside   Antibiotics:  Rocephin 1/27  HPI/Subjective:  Patient was admitted for elective ventral hernia repair and developed persistent delirium postop. [facility] service was consulted on 06/16/2014, at that time it was thought that patient had alcohol withdrawal delirium so was started on CIWA protocol. Patient also was started on Haldol when necessary. He was seen by psychiatry at that time they switched him to Librium 25 mg twice a day. But after starting Librium patient has become more agitated and confused, [facility] service was again consulted for further  recommendation. Palliative care was consulted for the management of delirium. Patient started on diazepam and prn haldol. Started on Ceftriaxone for the UTI.  Urine culture is growing morphophytes,  As per caregiver patient had better night last night he slept for 5 hours. This morning he is confused but pleasant  Objective: Filed Vitals:   06/23/14 0653  BP: 112/59  Pulse: 66  Temp: 97.6 F (36.4 C)  Resp: 18    Intake/Output Summary (Last 24 hours) at 06/23/14 1038 Last data filed at 06/22/14 2142  Gross per 24 hour  Intake    360 ml  Output    300 ml  Net     60 ml   Filed Weights   06/16/14 0418 06/18/14 0500 06/19/14 0400  Weight: 111.4 kg (245 lb 9.5 oz) 111.358 kg (245 lb 8 oz) 108.047 kg (238 lb 3.2 oz)    Exam:  Physical Exam: Eyes: No icterus, extraocular muscles intact  Lungs: Normal respiratory effort, bilateral clear to auscultation, no crackles or wheezes.  Heart: Regular rate and rhythm, S1 and S2 normal, no murmurs, rubs auscultated Abdomen: BS normoactive,soft,nondistended,non-tender to palpation,no organomegaly Extremities: No pretibial edema, no erythema, no cyanosis, no clubbing Neuro : Alert and confused   Data Reviewed: Basic Metabolic Panel:  Recent Labs Lab 06/17/14 1038 06/18/14 0347 06/19/14 0410 06/20/14 0410 06/22/14 1020  NA  --  143 140 142 141  K  --  4.3 4.0 3.9 3.8  CL  --  109 105 106 105  CO2  --  29 27 28 26   GLUCOSE  --  126* 109* 103* 121*  BUN  --  10 9 10 15   CREATININE  --  1.20 1.12 1.15 1.32  CALCIUM  --  8.5 8.7 8.9 8.8  MG 1.6  --   --   --  1.8  PHOS 3.6  --   --   --   --    Liver Function Tests:  Recent Labs Lab 06/22/14 1020  AST 62*  ALT 62*  ALKPHOS 60  BILITOT 0.9  PROT 6.4  ALBUMIN 3.1*   No results for input(s): LIPASE, AMYLASE in the last 168 hours. No results for input(s): AMMONIA in the last 168 hours. CBC:  Recent Labs Lab 06/17/14 1038 06/22/14 1020  WBC 6.4 10.5  NEUTROABS 4.4   --   HGB 10.9* 12.5*  HCT 34.0* 38.3*  MCV 99.1 97.5  PLT 285 356   Cardiac Enzymes: No results for input(s): CKTOTAL, CKMB, CKMBINDEX, TROPONINI in the last 168 hours. BNP (last 3 results) No results for input(s): PROBNP in the last 8760 hours. CBG: No results for input(s): GLUCAP in the last 168 hours.  Recent Results (from the past 240 hour(s))  Culture, Urine     Status: None   Collection Time: 06/21/14 11:33 AM  Result Value Ref Range Status   Specimen Description URINE, CLEAN CATCH  Final   Special Requests NONE  Final   Colony Count   Final    >=100,000 COLONIES/ML Performed at [facility]    Culture   Final    Multiple bacterial morphotypes present, none predominant. Suggest appropriate recollection if clinically indicated. Performed at [facility]    Report Status 06/22/2014 FINAL  Final     Studies: Ct Head Wo Contrast  06/21/2014   CLINICAL DATA:  Delirium. Confusion, combative. Does not answer questions.  EXAM: CT HEAD WITHOUT CONTRAST  TECHNIQUE: Contiguous axial images were obtained from the base of the skull through the vertex without intravenous contrast.  COMPARISON:  None.  FINDINGS: Prominence of the sulci and ventricles are noted. There is mild low attenuation throughout the subcortical and periventricular white matter compatible with chronic microvascular disease.  No evidence for acute intracranial hemorrhage, cortical infarct, or mass. No abnormal extra-axial fluid collections.  The paranasal sinuses are clear. The mastoid air cells are clear. The skull is intact.  IMPRESSION: 1. No acute intracranial abnormalities. 2. Small vessel ischemic disease and brain atrophy.   Electronically Signed   By: Signa Kell M.D.   On: 06/21/2014 18:21    Scheduled Meds: . acetaminophen  650 mg Oral QID  . aspirin  325 mg Oral Daily  . atenolol  25 mg Oral Daily  . diclofenac  1 patch Transdermal BID  . enoxaparin (LOVENOX) injection  0.5 mg/kg  Subcutaneous Q24H  . folic acid  1 mg Oral Daily  . levofloxacin  500 mg Oral Daily  . lidocaine  1 patch Transdermal Q24H  . senna-docusate  1 tablet Oral QHS  . tamsulosin  0.8 mg Oral QHS  . thiamine  100 mg Oral Daily   Continuous Infusions:    Principal Problem:   Alcohol withdrawal delirium Active Problems:   Essential hypertension, benign   Hyperlipidemia   Coronary artery disease   Ventral hernia without obstruction or gangrene   Non-sustained ventricular tachycardia   Delirium due to multiple etiologies   Idiopathic peripheral neuropathy    Time spent: *20 min    [facility]  [facility] Pager [phone]*. If 7PM-7AM, please contact night-coverage at [email or web address], password [facility] 06/23/2014, 10:38 AM  LOS: 14 days

## 2014-06-23 NOTE — Discharge Instructions (Signed)
[facility] - DISCHARGE INSTRUCTIONS TO PATIENT  Activity:  Lifting - No lifting greater than 15 pounds from 1 month from the date of surgery  Wound Care:   May shower (he still has sutures in place that I want to leave until at least 06/28/2014)       Will have to discuss with my office about management of these sutures       Wear abdominal binder for at least 4 weeks after surgery  Diet:  As tolerated  Follow up appointment:  This will depend on discharge date and his condition after discharge.  Call Dr. Allene Pyo office [facility]) at [phone] and speak with his nurse Ammie on 06/26/2014 to discuss follow up plans.  Medications and dosages:  HIs meds on discharge will be determined by Dr. Phillips Odor  Call Dr. Ezzard Standing or his office  [phone]) if you have:  Temperature greater than 100.4,  Persistent nausea and vomiting,  Any other questions or concerns you may have after discharge.  In an emergency, call 911 or go to an Emergency Department at a nearby hospital.   [facility] _______[facility]  UMBILICAL OR INGUINAL HERNIA REPAIR: POST OP INSTRUCTIONS  Always review your discharge instruction sheet given to you by the facility where your surgery was performed. IF YOU HAVE DISABILITY OR FAMILY LEAVE FORMS, YOU MUST BRING THEM TO THE OFFICE FOR PROCESSING.   DO NOT GIVE THEM TO YOUR DOCTOR.  1. A  prescription for pain medication may be given to you upon discharge.  Take your pain medication as prescribed, if needed.  If narcotic pain medicine is not needed, then you may take acetaminophen (Tylenol) or ibuprofen (Advil) as needed. 2. Take your usually prescribed medications unless otherwise directed. 3. If you need a refill on your pain medication, please contact your pharmacy.  They will contact our office to request authorization. Prescriptions will not be filled after 5 pm or on week-ends. 4. You should follow a light diet the first 24 hours after  arrival home, such as soup and crackers, etc.  Be sure to include lots of fluids daily.  Resume your normal diet the day after surgery. 5. Most patients will experience some swelling and bruising around the umbilicus or in the groin and scrotum.  Ice packs and reclining will help.  Swelling and bruising can take several days to resolve.  6. It is common to experience some constipation if taking pain medication after surgery.  Increasing fluid intake and taking a stool softener (such as Colace) will usually help or prevent this problem from occurring.  A mild laxative (Milk of Magnesia or Miralax) should be taken according to package directions if there are no bowel movements after 48 hours. 7. Unless discharge instructions indicate otherwise, you may remove your bandages 24-48 hours after surgery, and you may shower at that time.  You may have steri-strips (small skin tapes) in place directly over the incision.  These strips should be left on the skin for 7-10 days.  If your surgeon used skin glue on the incision, you may shower in 24 hours.  The glue will flake off over the next 2-3 weeks.  Any sutures or staples will be removed at the office during your follow-up visit. 8. ACTIVITIES:  You may resume regular (light) daily activities beginning the next day--such as daily self-care, walking, climbing stairs--gradually increasing activities as tolerated.  You may have sexual intercourse when it is comfortable.  Refrain from any  heavy lifting or straining until approved by your doctor. a. You may drive when you are no longer taking prescription pain medication, you can comfortably wear a seatbelt, and you can safely maneuver your car and apply brakes. b. RETURN TO WORK:  __________________________________________________________ 9. You should see your doctor in the office for a follow-up appointment approximately 2-3 weeks after your surgery.  Make sure that you call for this appointment within a day or two  after you arrive home to insure a convenient appointment time. 10. OTHER INSTRUCTIONS:  __________________________________________________________________________________________________________________________________________________________________________________________  WHEN TO CALL YOUR DOCTOR: 1. Fever over 101.0 2. Inability to urinate 3. Nausea and/or vomiting 4. Extreme swelling or bruising 5. Continued bleeding from incision. 6. Increased pain, redness, or drainage from the incision  The clinic staff is available to answer your questions during regular business hours.  Please dont hesitate to call and ask to speak to one of the nurses for clinical concerns.  If you have a medical emergency, go to the nearest emergency room or call 911.  [facility] is always on call at the hospital   [address], [address], [place], [place]  [address] ?  [address] [address], [place], [place]   [address] [phone] ? [phone] ? FAX [phone] Web site: [email or web address]

## 2014-06-23 NOTE — Progress Notes (Signed)
Called by RN re: family requesting detailed discharge information and review of care plan. I met with Norm and his entire family- we reviewed his new medication regimen, I prepared them for possible illness/recovery trajectories, discussed outpatient resources including an outpatient referral to [facility]-Palliative care services to assist with further symptom management. He is dramatically improved-much less agitated, and very appropriate in his conversation this evening. I edited the discharge med list slightly and re-printed the AVS. Family satisfied with the care provided and I gave them the PMT contact information.  I reviewed causes for delirium and how to handle periods of agitation at home.  Time: 5:30-6:05PM Total Time: 35 minutes  Greater than 50%  of this time was spent counseling and coordinating care related to the above assessment and plan.   Lane Hacker, DO Palliative Medicine

## 2014-06-23 NOTE — Care Management Note (Signed)
CARE MANAGEMENT NOTE 06/23/2014  Patient:  Jesus [name],Jesus [name]   Account Number:  [id number]  Date Initiated:  06/13/2014  Documentation initiated by:  Lorenda IshiharaPEELE,SUZANNE  Subjective/Objective Assessment:   81 yo male admitted to [facility] s/p open ventral hernia repair and lysis of adhesions. PTA lived at home alone.     Action/Plan:   Home vs SNF   Anticipated DC Date:  06/22/2014   Anticipated DC Plan:  HOME W HOME HEALTH SERVICES  In-house referral  Clinical Social Worker      DC Associate Professorlanning Services  CM consult      [facility]   Choice offered to / List presented to:  NA   DME arranged  3-N-1  HOSPITAL BED  WALKER - ROLLING  TUB BENCH      DME agency  [facility]     HH arranged  HH-1 RN  HH-2 PT  HH-3 OT  HH-4 NURSE'S AIDE  HH-6 SOCIAL WORKER      HH agency  [facility]   Status of service:  In process, will continue to follow Medicare Important Message given?  YES (If response is "NO", the following Medicare IM given date fields will be blank) Date Medicare IM given:  06/23/2014 Medicare IM given by:  Sandford CrazeLEMENTS,Lacreshia Bondarenko Date Additional Medicare IM given:   Additional Medicare IM given by:    Discharge Disposition:    Per UR Regulation:  Reviewed for med. necessity/level of care/duration of stay  If discussed at Long Length of Stay Meetings, dates discussed:    Comments:  06/23/14 Sandford Crazeora Dewie Ahart RN,BSN,NCM [phone] Spoke with Earney NavyJackie Brown who states that family wants to take pt home and wants to arrange 24hr care for him.  Left with her Private Duty Care list and [facility]  [facility] and [facility] rep called to give referral. Pt needing hospital Bed, 3 in 1, rolling walker and tub bench.  All DME orders are in and [facility] to give referral.  No other CM needs noted.  [id number]/Rhonda Davis,RN,BSN,CCM: aptient continues to have etoh w/d and confusion, dressing changes on going,

## 2014-06-23 NOTE — Progress Notes (Addendum)
Jesus [name] had issues with sundowning last night which is expected-no where near to the degree that he has previously had- he responded well to the low dose haldol and also to a small dose of fentanyl for pain. I think that home is the only thing that will clear him-he is this morning very appropriate, frustrated with his need to depend on others for his basic needs. Again, I do not think he will do well at SNF. I am hoping CM can help the family get in home care team set up, home PT, home health, [facility] Palliative services for symptom management outpatient, hospital bed, [facility] etc... Family considering options-they are most worried about him not doing well at home and them not qualifying for SNF if it becomes too much at home. He is ambulating with a walker in the halls well with one person assistance.  I am going to transition him to all PO meds today, saline lock his IV, start oral abx and SL meds for agitation.Per nursing he is taking meds very well.  Plan to meet family this afternoon around 3PM. I also plan to address advance directives with them.  Time: 35 minutes 6:15-645 AM  Jesus MaltaElizabeth Lorita Forinash, DO Palliative Medicine [phone]

## 2014-06-23 NOTE — Progress Notes (Signed)
Clinical Social Work  Per chart review, psych MD has completed evaluation and signing off. Psych CSW signing off as well but available if further needs arise.  RamonaHolly Haven Foss, [place] [phone]

## 2014-06-23 NOTE — Progress Notes (Signed)
Palliative Care Team at [facility] Progress Note   SUBJECTIVE: Much improved-ambulating in the halls, much less agitated. He does complain of pain in his abdominal wall. As the afternoon progresses he is becoming more agitated.  OBJECTIVE: Vital Signs: BP 115/60 mmHg  Pulse 71  Temp(Src) 98.4 F (36.9 C) (Oral)  Resp 20  Ht  (1.778 m)  Wt 108.047 kg (238 lb 3.2 oz)  BMI 34.18 kg/m2  SpO2 95%   Intake and Output: 01/28 0701 - 01/29 0700 In: 420 [P.O.:420] Out: 300 [Urine:300]  Physical Exam: General: Slightly confused, much more clear  Head: normal  Lungs:  Shallow, not congested  Heart: tachy  Abdomen:  soft  Extremities: No edema    Allergies  Allergen Reactions  . Cardura [Doxazosin Mesylate]     Hypotenstion  . Gabapentin     Weight gain    Medications: Scheduled Meds:  . aspirin  325 mg Oral Daily  . atenolol  50 mg Oral Daily  . cefTRIAXone (ROCEPHIN)  IV  1 g Intravenous Q24H  . diazepam  5 mg Intravenous BID  . enoxaparin (LOVENOX) injection  0.5 mg/kg Subcutaneous Q24H  . folic acid  1 mg Intravenous Daily  . haloperidol lactate  0.5 mg Intravenous 3 times per day  . lidocaine  1 patch Transdermal Q24H  . tamsulosin  0.8 mg Oral QHS  . thiamine IV  100 mg Intravenous Daily    Continuous Infusions: . sodium chloride 100 mL/hr at 06/22/14 1054    PRN Meds: diazepam, fentaNYL, senna-docusate, spiritus frumenti  Labs: CBC    Component Value Date/Time   WBC 10.5 06/22/2014 1020   RBC 3.93* 06/22/2014 1020   HGB 12.5* 06/22/2014 1020   HCT 38.3* 06/22/2014 1020   PLT 356 06/22/2014 1020   MCV 97.5 06/22/2014 1020   MCH 31.8 06/22/2014 1020   MCHC 32.6 06/22/2014 1020   RDW 12.8 06/22/2014 1020   LYMPHSABS 0.9 06/17/2014 1038   MONOABS 0.8 06/17/2014 1038   EOSABS 0.3 06/17/2014 1038   BASOSABS 0.0 06/17/2014 1038    CMET     Component Value Date/Time   NA 141 06/22/2014 1020   K 3.8 06/22/2014 1020   CL 105 06/22/2014 1020    CO2 26 06/22/2014 1020   GLUCOSE 121* 06/22/2014 1020   BUN 15 06/22/2014 1020   CREATININE 1.32 06/22/2014 1020   CALCIUM 8.8 06/22/2014 1020   PROT 6.4 06/22/2014 1020   ALBUMIN 3.1* 06/22/2014 1020   AST 62* 06/22/2014 1020   ALT 62* 06/22/2014 1020   ALKPHOS 60 06/22/2014 1020   BILITOT 0.9 06/22/2014 1020   GFRNONAA 48* 06/22/2014 1020   GFRAA 56* 06/22/2014 1020     ASSESSMENT/ PLAN: Post-operative subacute delirium in setting of UTI, pain medication and hospitalization. Although not symptomatic before admission he has vascular dementia and the combination of surgery and being out of his familiar environment and meds along with UTI have led to a prolonged hospital course. While he is much better today he has a ways to go and it is unclear where his baseline is going to level out- sadly I doubt he will return to his completely normal pre-op self and this is just an inherent risk in elderly people having surgery and delirium with existing vascular disease. Will take weeks for this to stabilize and getting out of the hospital and being in his home is really the very best thing for him- I think he will decline at  SNF and he has LTC insurance and resources so I have recommended home ASAP with 24/7 caregivers and home PT, as well as meds for symptom management.   Home with 24/7 private caregivers- really the only thing that will get this better-I have prepared family for the needs and how once he gets home his symptoms with gradually improve.  Outpatient palliative consult with HOTP program  I have adjusted meds- he did well with the low dose fentanyl for pain and is doing well with scheduled Tylenol-will add on a Flector patch to his abdominal wall as well.  Gentle hydration  Monitoring for urinary retension  Monitoring for constipation-there is always a trigger for the agitation and it can be something as simple as needing to have a BM.I will provide educational resources to  family- will ask CSW to give them information from [facility] on caregiving for family with dementia/delirium.      Time In: 4:30 Time Out: 5:30 Total Time Spent with Patient: 60 minutes Total Overall Time: 60 minutes   Greater than 50%  of this time was spent counseling and coordinating care related to the above assessment and plan.   Edsel PetrinElizabeth L Johnte Portnoy, DO  06/23/2014, 6:20 AM  Please contact Palliative Medicine Team phone at [phone] for questions and concerns.

## 2014-06-25 ENCOUNTER — Other Ambulatory Visit: Payer: Self-pay | Admitting: Internal Medicine

## 2014-06-25 ENCOUNTER — Encounter: Payer: Self-pay | Admitting: Internal Medicine

## 2014-06-25 DIAGNOSIS — N401 Enlarged prostate with lower urinary tract symptoms: Secondary | ICD-10-CM | POA: Insufficient documentation

## 2014-06-25 DIAGNOSIS — R351 Nocturia: Secondary | ICD-10-CM

## 2014-06-25 DIAGNOSIS — F0150 Vascular dementia without behavioral disturbance: Secondary | ICD-10-CM | POA: Insufficient documentation

## 2014-06-25 MED ORDER — ALPRAZOLAM 1 MG PO TABS: 1.0000 mg | ORAL_TABLET | Freq: Four times a day (QID) | ORAL | Status: DC | PRN

## 2014-06-27 ENCOUNTER — Telehealth: Payer: Self-pay | Admitting: Internal Medicine

## 2014-06-27 NOTE — Discharge Summary (Signed)
Physician Discharge Summary  Patient ID:  Jesus [name]  MRN: [id number]  DOB/AGE: 1928-08-19 81 y.o.  Admit date: 06/09/2014 Discharge date: 06/27/2014  Discharge Diagnoses:  1. Lower abdominal wall incisional ventral hernia.  1A.  Hernia in right subcostal incision seen on laparoscopy 1B.  Left inguinal hernia seen on laparoscopy  2. Post op confusion/delirium - underlying dementia vs alcohol withdrawal vs both  Seen by hospitalist (Drs. Darrick Meigs and Wendee Beavers), and , psych (Dr. Durward Parcel) and Dr. Rhea Pink for palliative medicine.  Ms. Hilma Favors did the best managing the patients confusion. [Family has found Geriatric MD Anthoney Harada [phone] to help with discharge planning]  3. HTN 4. CAD CABG - 2005 Stent placed about 2010. 08/01/2013 - Nuc myocardial perfusion - Moderate inferolateral and anterolateral defect consistent with scar and soft tissue attnenuation with minimal periinfarct ischemia. LV Ejection Fraction: 47%. LV Wall Motion: Lateral hypokinesis.  Dr. Fransico Him sent a note 12/12/2013 that said he is low risk for surgery. 5. Neuropathy - Both feet, etiology unclear. 6. BPH -sees Dr. Keene Breath On Flomax 7. GERD, but stopped meds about one year ago. 8. Heart murmur   Principal Problem:   Alcohol withdrawal delirium Active Problems:   Essential hypertension, benign   Hyperlipidemia   Coronary artery disease   Ventral hernia without obstruction or gangrene   Non-sustained ventricular tachycardia   Delirium due to multiple etiologies   Idiopathic peripheral neuropathy   DNR (do not resuscitate)   Operation: Procedure(s): LAPAROSCOPIC ASSISTED OPEN VENTRAL HERNIA REPAIR LAPAROSCOPIC LYSIS OF ADHESIONS on 06/09/2014 - D. [facility]  Discharged Condition: fair  Hospital Course: Jesus [name] is an 81 y.o. male whose primary care physician is [facility], MD and who was  admitted 06/09/2014 with a chief complaint of ventral incision hernia.   He was brought to the operating room on 06/09/2014 and underwent  LAPAROSCOPIC ASSISTED OPEN VENTRAL HERNIA REPAIR, LAPAROSCOPIC LYSIS OF ADHESIONS.   Post op he did well from the surgery.  He was placed initially in the ICU for watching him medically post op. But on about the 2nd post op day, he became more confused.  There was a question of the amount of alcohol that he drank and part of this confusion was thought to be due to EtOH withdrawal.  He was tried on Haldol and Ativan without much help. By 06/16/2014, his confusion showed no improvement and I asked the hospitalist to see the patient.  He was seen by Dr. Wendee Beavers.  I never found a medical cause for his confusion.  On 06/19/2014, medicine asked Dr. Louretta Shorten from psychiatry to see the patient.  Then on 06/20/2014, medicine asked Dr. Hilma Favors of palliative care to see the patient.  A CT of his head was negative.  His labs showed no critical value.  He was treated briefly for a UTI, but his cultures were non specific - so the antibiotic was stopped.  On Friday, 06/23/2014, Dr. Hilma Favors met with the patient and family to go home. She "met with Jesus [name] and his entire family - we reviewed his new medication regimen, I prepared them for possible illness/recovery trajectories, discussed outpatient resources including an outpatient referral to [facility]Palliative care services to assist with further symptom management. He is dramatically improved-much less agitated, and very appropriate in his conversation this evening. I edited the discharge med list slightly and re-printed the AVS. Family satisfied with the care provided and I gave them the PMT contact information"  The discharge instructions were  reviewed with the patient.  Consults:  Hospitalist (Drs. Darrick Meigs and Wendee Beavers), Dr. Louretta Shorten, and Dr. Renaee Munda  Significant Diagnostic Studies: Results for orders placed or performed during the  hospital encounter of 06/09/14  Culture, Urine  Result Value Ref Range   Specimen Description URINE, CLEAN CATCH    Special Requests NONE    Colony Count      >=100,000 COLONIES/ML Performed at [facility]    Culture      Multiple bacterial morphotypes present, none predominant. Suggest appropriate recollection if clinically indicated. Performed at [facility]    Report Status 06/22/2014 FINAL   Basic metabolic panel  Result Value Ref Range   Sodium 134 (L) 135 - 145 mmol/L   Potassium 4.2 3.5 - 5.1 mmol/L   Chloride 104 96 - 112 mEq/L   CO2 25 19 - 32 mmol/L   Glucose, Bld 155 (H) 70 - 99 mg/dL   BUN 24 (H) 6 - 23 mg/dL   Creatinine, Ser 1.30 0.50 - 1.35 mg/dL   Calcium 8.4 8.4 - 10.5 mg/dL   GFR calc non Af Amer 49 (L) >90 mL/min   GFR calc Af Amer 57 (L) >90 mL/min   Anion gap 5 5 - 15  CBC  Result Value Ref Range   WBC 8.1 4.0 - 10.5 K/uL   RBC 3.94 (L) 4.22 - 5.81 MIL/uL   Hemoglobin 12.5 (L) 13.0 - 17.0 g/dL   HCT 39.4 39.0 - 52.0 %   MCV 100.0 78.0 - 100.0 fL   MCH 31.7 26.0 - 34.0 pg   MCHC 31.7 30.0 - 36.0 g/dL   RDW 13.4 11.5 - 15.5 %   Platelets 209 150 - 400 K/uL  Basic metabolic panel  Result Value Ref Range   Sodium 131 (L) 135 - 145 mmol/L   Potassium 4.3 3.5 - 5.1 mmol/L   Chloride 103 96 - 112 mEq/L   CO2 23 19 - 32 mmol/L   Glucose, Bld 143 (H) 70 - 99 mg/dL   BUN 19 6 - 23 mg/dL   Creatinine, Ser 1.23 0.50 - 1.35 mg/dL   Calcium 8.0 (L) 8.4 - 10.5 mg/dL   GFR calc non Af Amer 52 (L) >90 mL/min   GFR calc Af Amer 61 (L) >90 mL/min   Anion gap 5 5 - 15  CBC with Differential  Result Value Ref Range   WBC 8.0 4.0 - 10.5 K/uL   RBC 3.59 (L) 4.22 - 5.81 MIL/uL   Hemoglobin 11.5 (L) 13.0 - 17.0 g/dL   HCT 36.2 (L) 39.0 - 52.0 %   MCV 100.8 (H) 78.0 - 100.0 fL   MCH 32.0 26.0 - 34.0 pg   MCHC 31.8 30.0 - 36.0 g/dL   RDW 13.4 11.5 - 15.5 %   Platelets 201 150 - 400 K/uL   Neutrophils Relative % 80 (H) 43 - 77 %   Neutro Abs  6.5 1.7 - 7.7 K/uL   Lymphocytes Relative 9 (L) 12 - 46 %   Lymphs Abs 0.7 0.7 - 4.0 K/uL   Monocytes Relative 10 3 - 12 %   Monocytes Absolute 0.8 0.1 - 1.0 K/uL   Eosinophils Relative 1 0 - 5 %   Eosinophils Absolute 0.1 0.0 - 0.7 K/uL   Basophils Relative 0 0 - 1 %   Basophils Absolute 0.0 0.0 - 0.1 K/uL  CBC with Differential  Result Value Ref Range   WBC 7.2 4.0 - 10.5 K/uL  RBC 3.45 (L) 4.22 - 5.81 MIL/uL   Hemoglobin 11.2 (L) 13.0 - 17.0 g/dL   HCT 34.5 (L) 39.0 - 52.0 %   MCV 100.0 78.0 - 100.0 fL   MCH 32.5 26.0 - 34.0 pg   MCHC 32.5 30.0 - 36.0 g/dL   RDW 13.3 11.5 - 15.5 %   Platelets 202 150 - 400 K/uL   Neutrophils Relative % 77 43 - 77 %   Neutro Abs 5.6 1.7 - 7.7 K/uL   Lymphocytes Relative 9 (L) 12 - 46 %   Lymphs Abs 0.7 0.7 - 4.0 K/uL   Monocytes Relative 13 (H) 3 - 12 %   Monocytes Absolute 0.9 0.1 - 1.0 K/uL   Eosinophils Relative 1 0 - 5 %   Eosinophils Absolute 0.1 0.0 - 0.7 K/uL   Basophils Relative 0 0 - 1 %   Basophils Absolute 0.0 0.0 - 0.1 K/uL  Basic metabolic panel  Result Value Ref Range   Sodium 135 135 - 145 mmol/L   Potassium 4.2 3.5 - 5.1 mmol/L   Chloride 108 96 - 112 mEq/L   CO2 24 19 - 32 mmol/L   Glucose, Bld 120 (H) 70 - 99 mg/dL   BUN 15 6 - 23 mg/dL   Creatinine, Ser 1.11 0.50 - 1.35 mg/dL   Calcium 8.1 (L) 8.4 - 10.5 mg/dL   GFR calc non Af Amer 59 (L) >90 mL/min   GFR calc Af Amer 69 (L) >90 mL/min   Anion gap 3 (L) 5 - 15  Comprehensive metabolic panel  Result Value Ref Range   Sodium 138 135 - 145 mmol/L   Potassium 4.0 3.5 - 5.1 mmol/L   Chloride 108 96 - 112 mEq/L   CO2 24 19 - 32 mmol/L   Glucose, Bld 117 (H) 70 - 99 mg/dL   BUN 15 6 - 23 mg/dL   Creatinine, Ser 1.02 0.50 - 1.35 mg/dL   Calcium 8.3 (L) 8.4 - 10.5 mg/dL   Total Protein 5.6 (L) 6.0 - 8.3 g/dL   Albumin 3.0 (L) 3.5 - 5.2 g/dL   AST 48 (H) 0 - 37 U/L   ALT 34 0 - 53 U/L   Alkaline Phosphatase 52 39 - 117 U/L   Total Bilirubin 1.1 0.3 - 1.2 mg/dL    GFR calc non Af Amer 66 (L) >90 mL/min   GFR calc Af Amer 76 (L) >90 mL/min   Anion gap 6 5 - 15  Basic metabolic panel  Result Value Ref Range   Sodium 139 135 - 145 mmol/L   Potassium 4.2 3.5 - 5.1 mmol/L   Chloride 109 96 - 112 mEq/L   CO2 25 19 - 32 mmol/L   Glucose, Bld 119 (H) 70 - 99 mg/dL   BUN 15 6 - 23 mg/dL   Creatinine, Ser 1.13 0.50 - 1.35 mg/dL   Calcium 8.5 8.4 - 10.5 mg/dL   GFR calc non Af Amer 58 (L) >90 mL/min   GFR calc Af Amer 67 (L) >90 mL/min   Anion gap 5 5 - 15  Magnesium  Result Value Ref Range   Magnesium 1.8 1.5 - 2.5 mg/dL  CBC with Differential  Result Value Ref Range   WBC 7.0 4.0 - 10.5 K/uL   RBC 3.72 (L) 4.22 - 5.81 MIL/uL   Hemoglobin 11.8 (L) 13.0 - 17.0 g/dL   HCT 37.3 (L) 39.0 - 52.0 %   MCV 100.3 (H) 78.0 - 100.0 fL  MCH 31.7 26.0 - 34.0 pg   MCHC 31.6 30.0 - 36.0 g/dL   RDW 13.1 11.5 - 15.5 %   Platelets 247 150 - 400 K/uL   Neutrophils Relative % 60 43 - 77 %   Neutro Abs 4.2 1.7 - 7.7 K/uL   Lymphocytes Relative 21 12 - 46 %   Lymphs Abs 1.5 0.7 - 4.0 K/uL   Monocytes Relative 14 (H) 3 - 12 %   Monocytes Absolute 1.0 0.1 - 1.0 K/uL   Eosinophils Relative 5 0 - 5 %   Eosinophils Absolute 0.3 0.0 - 0.7 K/uL   Basophils Relative 0 0 - 1 %   Basophils Absolute 0.0 0.0 - 0.1 K/uL  CBC with Differential  Result Value Ref Range   WBC 6.7 4.0 - 10.5 K/uL   RBC 3.55 (L) 4.22 - 5.81 MIL/uL   Hemoglobin 11.4 (L) 13.0 - 17.0 g/dL   HCT 35.1 (L) 39.0 - 52.0 %   MCV 98.9 78.0 - 100.0 fL   MCH 32.1 26.0 - 34.0 pg   MCHC 32.5 30.0 - 36.0 g/dL   RDW 12.7 11.5 - 15.5 %   Platelets 258 150 - 400 K/uL   Neutrophils Relative % 69 43 - 77 %   Neutro Abs 4.6 1.7 - 7.7 K/uL   Lymphocytes Relative 14 12 - 46 %   Lymphs Abs 0.9 0.7 - 4.0 K/uL   Monocytes Relative 10 3 - 12 %   Monocytes Absolute 0.7 0.1 - 1.0 K/uL   Eosinophils Relative 7 (H) 0 - 5 %   Eosinophils Absolute 0.4 0.0 - 0.7 K/uL   Basophils Relative 0 0 - 1 %   Basophils  Absolute 0.0 0.0 - 0.1 K/uL  Basic metabolic panel  Result Value Ref Range   Sodium 141 135 - 145 mmol/L   Potassium 4.2 3.5 - 5.1 mmol/L   Chloride 108 96 - 112 mmol/L   CO2 25 19 - 32 mmol/L   Glucose, Bld 111 (H) 70 - 99 mg/dL   BUN 10 6 - 23 mg/dL   Creatinine, Ser 1.06 0.50 - 1.35 mg/dL   Calcium 8.7 8.4 - 10.5 mg/dL   GFR calc non Af Amer 63 (L) >90 mL/min   GFR calc Af Amer 73 (L) >90 mL/min   Anion gap 8 5 - 15  Magnesium  Result Value Ref Range   Magnesium 1.6 1.5 - 2.5 mg/dL  Phosphorus  Result Value Ref Range   Phosphorus 3.6 2.3 - 4.6 mg/dL  CBC with Differential/Platelet  Result Value Ref Range   WBC 6.4 4.0 - 10.5 K/uL   RBC 3.43 (L) 4.22 - 5.81 MIL/uL   Hemoglobin 10.9 (L) 13.0 - 17.0 g/dL   HCT 34.0 (L) 39.0 - 52.0 %   MCV 99.1 78.0 - 100.0 fL   MCH 31.8 26.0 - 34.0 pg   MCHC 32.1 30.0 - 36.0 g/dL   RDW 13.0 11.5 - 15.5 %   Platelets 285 150 - 400 K/uL   Neutrophils Relative % 69 43 - 77 %   Neutro Abs 4.4 1.7 - 7.7 K/uL   Lymphocytes Relative 15 12 - 46 %   Lymphs Abs 0.9 0.7 - 4.0 K/uL   Monocytes Relative 12 3 - 12 %   Monocytes Absolute 0.8 0.1 - 1.0 K/uL   Eosinophils Relative 4 0 - 5 %   Eosinophils Absolute 0.3 0.0 - 0.7 K/uL   Basophils Relative 0 0 - 1 %  Basophils Absolute 0.0 0.0 - 0.1 K/uL  Basic metabolic panel  Result Value Ref Range   Sodium 143 135 - 145 mmol/L   Potassium 4.3 3.5 - 5.1 mmol/L   Chloride 109 96 - 112 mmol/L   CO2 29 19 - 32 mmol/L   Glucose, Bld 126 (H) 70 - 99 mg/dL   BUN 10 6 - 23 mg/dL   Creatinine, Ser 1.20 0.50 - 1.35 mg/dL   Calcium 8.5 8.4 - 10.5 mg/dL   GFR calc non Af Amer 54 (L) >90 mL/min   GFR calc Af Amer 63 (L) >90 mL/min   Anion gap 5 5 - 15  RPR  Result Value Ref Range   RPR Ser Ql Non Reactive Non Reactive  HIV antibody  Result Value Ref Range   HIV 1/O/2 Abs-Index Value <1.00 <1.00   HIV-1/HIV-2 Ab Non Reactive Non Reactive  Vitamin B12  Result Value Ref Range   Vitamin B-12 1256 (H) 211  - 911 pg/mL  Folate  Result Value Ref Range   Folate >20.0 ng/mL  TSH  Result Value Ref Range   TSH 0.916 0.350 - 4.500 uIU/mL  Basic metabolic panel  Result Value Ref Range   Sodium 140 135 - 145 mmol/L   Potassium 4.0 3.5 - 5.1 mmol/L   Chloride 105 96 - 112 mmol/L   CO2 27 19 - 32 mmol/L   Glucose, Bld 109 (H) 70 - 99 mg/dL   BUN 9 6 - 23 mg/dL   Creatinine, Ser 1.12 0.50 - 1.35 mg/dL   Calcium 8.7 8.4 - 10.5 mg/dL   GFR calc non Af Amer 59 (L) >90 mL/min   GFR calc Af Amer 68 (L) >90 mL/min   Anion gap 8 5 - 15  Basic metabolic panel  Result Value Ref Range   Sodium 142 135 - 145 mmol/L   Potassium 3.9 3.5 - 5.1 mmol/L   Chloride 106 96 - 112 mmol/L   CO2 28 19 - 32 mmol/L   Glucose, Bld 103 (H) 70 - 99 mg/dL   BUN 10 6 - 23 mg/dL   Creatinine, Ser 1.15 0.50 - 1.35 mg/dL   Calcium 8.9 8.4 - 10.5 mg/dL   GFR calc non Af Amer 57 (L) >90 mL/min   GFR calc Af Amer 66 (L) >90 mL/min   Anion gap 8 5 - 15  Urinalysis, Routine w reflex microscopic  Result Value Ref Range   Color, Urine YELLOW YELLOW   APPearance CLOUDY (A) CLEAR   Specific Gravity, Urine 1.014 1.005 - 1.030   pH 7.0 5.0 - 8.0   Glucose, UA NEGATIVE NEGATIVE mg/dL   Hgb urine dipstick MODERATE (A) NEGATIVE   Bilirubin Urine NEGATIVE NEGATIVE   Ketones, ur NEGATIVE NEGATIVE mg/dL   Protein, ur 30 (A) NEGATIVE mg/dL   Urobilinogen, UA 1.0 0.0 - 1.0 mg/dL   Nitrite NEGATIVE NEGATIVE   Leukocytes, UA MODERATE (A) NEGATIVE  Urine microscopic-add on  Result Value Ref Range   WBC, UA 11-20 <3 WBC/hpf   RBC / HPF 3-6 <3 RBC/hpf   Bacteria, UA FEW (A) RARE  Comprehensive metabolic panel  Result Value Ref Range   Sodium 141 135 - 145 mmol/L   Potassium 3.8 3.5 - 5.1 mmol/L   Chloride 105 96 - 112 mmol/L   CO2 26 19 - 32 mmol/L   Glucose, Bld 121 (H) 70 - 99 mg/dL   BUN 15 6 - 23 mg/dL   Creatinine, Ser 1.32 0.50 -  1.35 mg/dL   Calcium 8.8 8.4 - 10.5 mg/dL   Total Protein 6.4 6.0 - 8.3 g/dL   Albumin  3.1 (L) 3.5 - 5.2 g/dL   AST 62 (H) 0 - 37 U/L   ALT 62 (H) 0 - 53 U/L   Alkaline Phosphatase 60 39 - 117 U/L   Total Bilirubin 0.9 0.3 - 1.2 mg/dL   GFR calc non Af Amer 48 (L) >90 mL/min   GFR calc Af Amer 56 (L) >90 mL/min   Anion gap 10 5 - 15  CBC  Result Value Ref Range   WBC 10.5 4.0 - 10.5 K/uL   RBC 3.93 (L) 4.22 - 5.81 MIL/uL   Hemoglobin 12.5 (L) 13.0 - 17.0 g/dL   HCT 38.3 (L) 39.0 - 52.0 %   MCV 97.5 78.0 - 100.0 fL   MCH 31.8 26.0 - 34.0 pg   MCHC 32.6 30.0 - 36.0 g/dL   RDW 12.8 11.5 - 15.5 %   Platelets 356 150 - 400 K/uL  Magnesium  Result Value Ref Range   Magnesium 1.8 1.5 - 2.5 mg/dL  Hemoglobin A1c  Result Value Ref Range   Hgb A1c MFr Bld 5.3 4.8 - 5.6 %   Mean Plasma Glucose 105 mg/dL    Dg Chest 2 View  06/07/2014   CLINICAL DATA:  Preoperative prior to ventral hernia repair; history of previous MI, CABG, aortic stenosis.  EXAM: CHEST  2 VIEW  COMPARISON:  Chest x-ray of August 23, 2013  FINDINGS: The lungs are adequately inflated. There is no focal infiltrate. There is no pleural effusion. The heart and pulmonary vascularity are normal. There are 7 intact sternal wires present. There is mild tortuosity of the descending thoracic aorta. There is no pleural effusion. The bony thorax exhibits no acute abnormality.  IMPRESSION: There is no evidence of CHF nor other active cardiopulmonary disease.   Electronically Signed   By: Shaquoia Miers  [place]   On: 06/07/2014 12:54   Ct Head Wo Contrast  06/21/2014   CLINICAL DATA:  Delirium. Confusion, combative. Does not answer questions.  EXAM: CT HEAD WITHOUT CONTRAST  TECHNIQUE: Contiguous axial images were obtained from the base of the skull through the vertex without intravenous contrast.  COMPARISON:  None.  FINDINGS: Prominence of the sulci and ventricles are noted. There is mild low attenuation throughout the subcortical and periventricular white matter compatible with chronic microvascular disease.  No evidence for acute  intracranial hemorrhage, cortical infarct, or mass. No abnormal extra-axial fluid collections.  The paranasal sinuses are clear. The mastoid air cells are clear. The skull is intact.  IMPRESSION: 1. No acute intracranial abnormalities. 2. Small vessel ischemic disease and brain atrophy.   Electronically Signed   By: Kerby Moors M.D.   On: 06/21/2014 18:21    Discharge Exam:  Filed Vitals:   06/23/14 1135  BP: 114/46  Pulse: 78  Temp:   Resp:     General: [name] who is confused. Lungs: Clear to auscultation and symmetric breath sounds. Heart:  RRR. No murmur or rub. Abdomen: Soft.  Normal bowel sounds.  Incision looks good.  I have left the 4 skin sutures in place.  Discharge Medications:     Medication List    STOP taking these medications        alfuzosin 10 MG 24 hr tablet  Commonly known as:  UROXATRAL     aspirin EC 81 MG tablet  Replaced by:  aspirin 325 MG tablet  B-complex with vitamin C tablet     CENTRUM SILVER PO     doxycycline 100 MG capsule  Commonly known as:  VIBRAMYCIN     ibuprofen 200 MG tablet  Commonly known as:  ADVIL,MOTRIN      TAKE these medications        acetaminophen 325 MG tablet  Commonly known as:  TYLENOL  Take 2 tablets (650 mg total) by mouth 4 (four) times daily.     aspirin 325 MG tablet  Take 1 tablet (325 mg total) by mouth daily.     atenolol 25 MG tablet  Commonly known as:  TENORMIN  Take 1 tablet (25 mg total) by mouth daily.     diazepam 5 MG tablet  Commonly known as:  VALIUM  Take 1 tablet (5 mg total) by mouth every 4 (four) hours as needed for anxiety, muscle spasms or sedation.     diclofenac 1.3 % Ptch  Commonly known as:  FLECTOR  Place 1 patch onto the skin 2 (two) times daily.     folic acid 1 MG tablet  Commonly known as:  FOLVITE  Take 1 tablet (1 mg total) by mouth daily.     haloperidol 2 MG/ML solution  Commonly known as:  HALDOL  Take 0.5 mLs (1 mg total) by mouth every 6 (six)  hours as needed for agitation.     levofloxacin 500 MG tablet  Commonly known as:  LEVAQUIN  Take 1 tablet (500 mg total) by mouth daily.     oxyCODONE 5 MG immediate release tablet  Commonly known as:  Oxy IR/ROXICODONE  Take 0.5 tablets (2.5 mg total) by mouth every 4 (four) hours as needed for moderate pain or severe pain.     pantoprazole 40 MG tablet  Commonly known as:  PROTONIX  Take 40 mg by mouth daily.     senna-docusate 8.6-50 MG per tablet  Commonly known as:  Senokot-S  Take 1 tablet by mouth at bedtime.     tamsulosin 0.4 MG Caps capsule  Commonly known as:  FLOMAX  Take 2 capsules (0.8 mg total) by mouth at bedtime.     thiamine 100 MG tablet  Take 1 tablet (100 mg total) by mouth daily.     triamcinolone cream 0.1 %  Commonly known as:  KENALOG  Apply 1 application topically 2 (two) times daily.        Disposition: 06-Home-Health Care Svc        Follow-up Information    Follow up with [facility], MD On 06/29/2014.   Specialty:  General Surgery   Why:  Your appointment is at 2:15, PM be there 30 minutes early for check in.   Contact information:   [address] [place] [address] [phone]       Follow up with  Melinda Crutch, MD. Schedule an appointment as soon as possible for a visit in 1 week.   Specialty:  Family Medicine   Why:  follow up with post-hospital care   Contact information:   [address] [place] [address] [phone]        Signed: Alphonsa Overall, M.D., [facility] Surgery Office:  [phone]  06/27/2014, 3:22 PM

## 2014-06-27 NOTE — Telephone Encounter (Signed)
[name], daughter, of Mr. Jesus SprinklesLang called request for Jesus [name] to be work in sooner than April for new pt hospital follow. Mrs. [name] stated that Dr. [name] is the one that refer them to [name].

## 2014-06-29 ENCOUNTER — Other Ambulatory Visit: Payer: Self-pay | Admitting: Internal Medicine

## 2014-06-29 MED ORDER — CHLORHEXIDINE GLUCONATE 0.12 % MT SOLN: 15.0000 mL | Freq: Two times a day (BID) | OROMUCOSAL | Status: AC

## 2014-06-29 MED ORDER — FLUCONAZOLE 150 MG PO TABS: ORAL_TABLET | ORAL | Status: DC

## 2014-06-29 NOTE — Telephone Encounter (Signed)
Can we move him to Feb 11th? Either morning or afternoon is fine.

## 2014-07-06 ENCOUNTER — Ambulatory Visit (INDEPENDENT_AMBULATORY_CARE_PROVIDER_SITE_OTHER): Payer: Medicare Other | Admitting: Internal Medicine

## 2014-07-06 ENCOUNTER — Encounter: Payer: Self-pay | Admitting: Internal Medicine

## 2014-07-06 ENCOUNTER — Other Ambulatory Visit (INDEPENDENT_AMBULATORY_CARE_PROVIDER_SITE_OTHER): Payer: Medicare Other

## 2014-07-06 VITALS — BP 140/62 | HR 75 | Temp 97.7°F | Resp 18 | Ht 70.0 in | Wt 236.4 lb

## 2014-07-06 DIAGNOSIS — R4182 Altered mental status, unspecified: Secondary | ICD-10-CM

## 2014-07-06 DIAGNOSIS — F05 Delirium due to known physiological condition: Secondary | ICD-10-CM

## 2014-07-06 DIAGNOSIS — F01518 Vascular dementia, unspecified severity, with other behavioral disturbance: Secondary | ICD-10-CM

## 2014-07-06 DIAGNOSIS — R410 Disorientation, unspecified: Secondary | ICD-10-CM

## 2014-07-06 DIAGNOSIS — R351 Nocturia: Secondary | ICD-10-CM

## 2014-07-06 DIAGNOSIS — N401 Enlarged prostate with lower urinary tract symptoms: Secondary | ICD-10-CM

## 2014-07-06 DIAGNOSIS — F0151 Vascular dementia with behavioral disturbance: Secondary | ICD-10-CM

## 2014-07-06 DIAGNOSIS — B351 Tinea unguium: Secondary | ICD-10-CM

## 2014-07-06 LAB — URINALYSIS, ROUTINE W REFLEX MICROSCOPIC
Bilirubin Urine: NEGATIVE
Hgb urine dipstick: NEGATIVE
Ketones, ur: NEGATIVE
LEUKOCYTES UA: NEGATIVE
Nitrite: NEGATIVE
RBC / HPF: NONE SEEN (ref 0–?)
SPECIFIC GRAVITY, URINE: 1.025 (ref 1.000–1.030)
Total Protein, Urine: NEGATIVE
Urine Glucose: NEGATIVE
Urobilinogen, UA: 0.2 (ref 0.0–1.0)
pH: 5.5 (ref 5.0–8.0)

## 2014-07-06 LAB — COMPREHENSIVE METABOLIC PANEL
ALK PHOS: 55 U/L (ref 39–117)
ALT: 18 U/L (ref 0–53)
AST: 20 U/L (ref 0–37)
Albumin: 3.5 g/dL (ref 3.5–5.2)
BUN: 23 mg/dL (ref 6–23)
CALCIUM: 8.9 mg/dL (ref 8.4–10.5)
CO2: 24 mEq/L (ref 19–32)
CREATININE: 1.38 mg/dL (ref 0.40–1.50)
Chloride: 109 mEq/L (ref 96–112)
GFR: 52.19 mL/min — ABNORMAL LOW (ref >60.00)
Glucose, Bld: 113 mg/dL — ABNORMAL HIGH (ref 70–99)
Potassium: 4.2 mEq/L (ref 3.5–5.1)
Sodium: 141 mEq/L (ref 135–145)
Total Bilirubin: 0.3 mg/dL (ref 0.2–1.2)
Total Protein: 6.2 g/dL (ref 6.0–8.3)

## 2014-07-06 LAB — CBC
HEMATOCRIT: 34.2 % — AB (ref 39.0–52.0)
Hemoglobin: 11.9 g/dL — ABNORMAL LOW (ref 13.0–17.0)
MCHC: 34.6 g/dL (ref 30.0–36.0)
MCV: 93 fl (ref 78.0–100.0)
Platelets: 308 10*3/uL (ref 150.0–400.0)
RBC: 3.68 Mil/uL — ABNORMAL LOW (ref 4.22–5.81)
RDW: 14.2 % (ref 11.5–15.5)
WBC: 6.4 10*3/uL (ref 4.0–10.5)

## 2014-07-06 LAB — AMMONIA: Ammonia: 45 umol/L — ABNORMAL HIGH (ref 11–35)

## 2014-07-06 MED ORDER — ATENOLOL 25 MG PO TABS: 25.0000 mg | ORAL_TABLET | Freq: Every day | ORAL | Status: DC

## 2014-07-06 MED ORDER — TAMSULOSIN HCL 0.4 MG PO CAPS: 0.4000 mg | ORAL_CAPSULE | Freq: Every day | ORAL | Status: DC

## 2014-07-06 MED ORDER — ALPRAZOLAM 0.25 MG PO TABS: 0.2500 mg | ORAL_TABLET | Freq: Two times a day (BID) | ORAL | Status: DC | PRN

## 2014-07-06 MED ORDER — QUETIAPINE FUMARATE 25 MG PO TABS: 25.0000 mg | ORAL_TABLET | Freq: Every day | ORAL | Status: DC

## 2014-07-06 NOTE — Patient Instructions (Signed)
We will plan to try a medicine called seroquel at night time instead of or in addition to the xanax.   We will start with 25 mg seroquel in the evening. If it is effective you do not need to use the xanax. If he is still having agitation we can use the xanax as needed.   With the seroquel we can increase the dosage by 25 mg every 3 days up to 100 mg. This should be a medicine that helps with mood swings without making him as drowsy as the xanax.   I am giving you a prescription for xanax 1 mg today and a prescription for 0.25 mg xanax today. This gives you the option to use a lower dose of xanax during the day to keep it in his system. The 1 mg xanax is for if he is having extra problems at night time.   We have checked his urine today and will check his blood work. We will get you into the neurologist to see if they have any extra thoughts for a plan of action.   The delirium in the hospital has likely activated some underlying dementia which has likely been worsened by the long delirium. A good book to read with some suggestions for management of memory problems is called the 36 hour day by Denver Faster.  Delirium Delirium (acute confusional state) is a sudden change in a person's brain function that causes the person to become confused for a short period of time. People with delirium often have trouble knowing where they are. Delirium comes on very fast. It can develop in a few days or just a few hours. Delirium usually occurs because of another mental or physical condition. For example, a person might develop delirium after a surgery. It is especially common in elderly people who are sick or in the hospital. People with dementia (a brain disease like Alzheimer's) or people who are near death may also develop delirium.  CAUSES  Delirium occurs when something affects the signals that the brain sends out. These signals can be affected by anything that puts stress on the body and brain, causing brain  chemicals to be out of balance. Structural health problems such as acute strokes, bleeding near the brain (intracranial bleeds), and trauma can also cause delirium. Sometimes the exact cause of delirium is not known. Usually, several factors contribute to the development of delirium. Things that may cause a person to develop delirium include:  Surgery, especially when anesthetics are involved.  Chronic medical conditions, such as chronic lung, heart, or kidney disease.  Fever.  Low body temperature (hypothermia).  Infection, such as pneumonia, severe intestinal infection, severe skin infection, or urinary tract infection.  Poor nutrition that leads to very low vitamin or protein levels in the body (malnutrition).  Body fluid loss (dehydration).  Low blood sugar.  High blood sugar, which typically occurs in people with severe diabetes.  Electrolyte abnormalities, such as sodium imbalance or acid-base disorders.  Low oxygen level.  Low blood pressure.  Uncontrolled high blood pressure.  Brain injury (trauma).  Stroke.  Abuse of alcohol or sudden withdrawal of alcohol.  Sudden tobacco withdrawal if the person is a longtime smoker.  Loss of vision or hearing.  Being strapped down (restrained).  Being in a new setting, such as an elderly person being admitted to a hospital.  Taking illegal drugs or quitting use of those drugs.  Taking certain medicines for pain, sleep, allergies, high blood pressure, anxiety, depression,  Parkinson disease, or seizures.  Sundowning syndrome, which is a complication of chronic dementia that can occur in the later part of a person's wake cycle. SYMPTOMS  The main sign of delirium is a sudden change in a person's mental state. This change can come and go. Symptoms may include:  Not being able to pay attention.  Being confused about places, time, and people.  Seeing, hearing, or feeling things that are not real (hallucinations).  Changes  in sleep patterns.  Being restless, hyperactive, irritable, and angry.  Extreme mood swings.  Rambling and senseless talking.  Difficulty speaking or understanding speech.  Memory loss.  Changes in consciousness, such as being sleepy, sluggish, lethargic, and withdrawn.  Focusing on things or ideas that are not important.  Unusual body movements or shaking (tremors). DIAGNOSIS  There are no specific tests for delirium, but a health care provider may do the following:  Perform a mental status assessment.The health care provider will check for confusion and lack of awareness by talking with the person and asking questions.  Talk with the person's friends and family. A friend or family member will often need to tell the health care provider about the person's symptoms and medical history, including medicines taken or missed.  Perform a physical exam. The health care provider will perform a physical exam to check for underlying conditions that may lead to delirium, such as dehydration, malnutrition, trauma, and infection. The exam may include checking for changes in vision, hearing, and the way the person moves (coordination and reflexes). The health care provider may order tests, such as:  Blood tests.  Urine tests.  Brain imaging, such as a CT scan or MRI scan.  X-rays (to look for lung problems or intestinal blockage). TREATMENT  Treatment will focus on the cause of the delirium. Delirium is a sign of another problem. If that problem can be found and treated, the delirium may go away. Full recovery can take several weeks. Keeping the person safe until the cause can be found and treated (supportive care) is often what is needed. In some cases, medicine may be prescribed to help keep the person calm. People with delirium should not be left alone because they may involuntarily harm themselves. HOME CARE INSTRUCTIONS  Supportive care is provided by the person living with, or caring  for, the person with delirium. It involves keeping the person safe, encouraging healthy habits, and helping the person stay aware of his or her surroundings. Take these steps to help care for someone with delirium:  Make sure the person eats a healthy diet.  Make sure the person gets enough fluids. The person should drink enough fluids to keep urine clear or pale yellow.  Do not leave the person alone.  Keep the person on a regular schedule. Maintain regular times for meals, sleeping, and being active.  Keep the home as quiet and stress-free as possible. This is very important at night and will help improve the quality of the person's sleep.  Let lots of sunlight into the home during the day.  Avoid total darkness at night.  Help the person maintain good hygiene to avoid skin infections, bed sores, and pressure ulcers.  Do activities outside as often as possible.  Take the person to see others whenever you can.  Make sure the person uses hearing aids and eyeglasses if needed.  Give frequent verbal reminders of the current time, location, and situation.  Use memory cues, such as clocks, calendars, and family photos.  Try to keep the person calm. Music or relaxation techniques may be helpful.  Always be on the lookout for symptoms of delirium.  Do not use restraints.  Only give over-the-counter or prescription medicines as directed by the health care provider.  Make sure the person takes all medicines on a regular schedule as directed by the health care provider.  Keep all follow-up appointments. SEEK MEDICAL CARE IF:  Any of the person's symptoms become worse.  New signs of delirium develop.  Caring for the person at home does not seem safe.  The person stops eating, drinking, or communicating.  The person develops vomiting. SEEK IMMEDIATE MEDICAL CARE IF:   Symptoms of delirium do not go away after treatment.  The person develops chest pain or shortness of  breath.  The person seems to want to harm someone or harm himself or herself. MAKE SURE YOU:   Understand these instructions.  Will watch the condition.  Will get help right away if the person is not doing well or gets worse. Document Released: 02/04/2012 Document Revised: 09/26/2013 Document Reviewed: 02/04/2012 [facility] Patient Information 2015 [place], [place]. This information is not intended to replace advice given to you by your health care provider. Make sure you discuss any questions you have with your health care provider.

## 2014-07-06 NOTE — Progress Notes (Signed)
Pre visit review using our clinic review tool, if applicable. No additional management support is needed unless otherwise documented below in the visit note. 

## 2014-07-07 ENCOUNTER — Other Ambulatory Visit: Payer: Self-pay | Admitting: Internal Medicine

## 2014-07-07 ENCOUNTER — Telehealth: Payer: Self-pay | Admitting: Internal Medicine

## 2014-07-07 MED ORDER — LACTULOSE 10 GM/15ML PO SOLN: 10.0000 g | Freq: Two times a day (BID) | ORAL | Status: DC

## 2014-07-07 NOTE — Telephone Encounter (Signed)
Amy are you able to call this pt back today or pt daughter.  They wanted to know pt had a uti because if so they wanted meds before the weekend?

## 2014-07-07 NOTE — Telephone Encounter (Signed)
Called patient. Results are not ready yet.

## 2014-07-07 NOTE — Telephone Encounter (Signed)
Annice PihJackie is calling regarding lab results. Advised at this point they have not been reviewed or even fully completed. She believes that the patient needs an antuibiotic and insists to speak to dr [name].

## 2014-07-08 ENCOUNTER — Encounter: Payer: Self-pay | Admitting: Internal Medicine

## 2014-07-08 MED ORDER — TAMSULOSIN HCL 0.4 MG PO CAPS: 0.8000 mg | ORAL_CAPSULE | Freq: Every day | ORAL | Status: AC

## 2014-07-08 NOTE — Assessment & Plan Note (Signed)
Likely some of his current symptoms are dementia that was precipitated by delirium. Spoke with them today about the increased risk of death for up to 1 year after episode of delirium. Unclear if some component of his symptoms today are still due to delirium. He is now well outside alcohol withdrawal. CT scan head in hospital reveals atrophy and microvascular changes. Check U/A and culture today to exclude infection. Check CBC and CMP, ammonia (given history of alcohol usage). For his behavioral issues we will initiate seroquel at night time to see if that will help and spare some xanax. Given rx for xanax 0.25 mg to see if this can be effective during the daytime to help him to stay level. They still have xanax 1 mg at home that they can use while we are titrating the seroquel for behavioral challenges. Information given about the 36 hour day as a resource for information. Will aggressively treat any possible cause of mental status change (including cleaning both ears out today). If no improvement in the next 1-2 months may need to accept that he will not return to his prior status and may need to have family discussion about his living situation if he is still requiring such extensive help. He is doing PT at home but not always able to participate and if he is not then they will stop coming. Would like him to be awake enough for PT to get him safer with ambulation.

## 2014-07-08 NOTE — Assessment & Plan Note (Signed)
Not priority currently and no need for treatment.

## 2014-07-08 NOTE — Assessment & Plan Note (Signed)
Refill flomax

## 2014-07-08 NOTE — Progress Notes (Signed)
   Subjective:    Patient ID: Jesus [name], male    DOB: 01/25/1929, 81 y.o.   MRN: [id number]  HPI The patient is here today for a hospital follow up and new patient. He did have a very complicated hospital course (surgery for hernia and long delirium post op >1 week with some clearing but residual dementia). Prior to the hospitalization he was driving, paying his own bills, living independently. He did drink significant amounts of alcohol in the past. He is not drinking any longer (they have offered but he did not want). His children are here with him today and give the history as he is not able to stay on topic and answer questions about his health. They are concerned that he has a urinary tract infection since he is not himself. Initially when he got home he was having a lot of behavioral problems which were managed with significant amounts of xanax. He has been maintained on lower doses of xanax throughout the day to keep him from getting anxious behaviors (picking at things and clothes and anxious). He is able to walk around. He is able to feed himself. However if he has too much xanax the night before for behavioral problems then he is not able to do much the following day. They are very upset about the hospital stay as he was fine before. They are staying with him as well as an aide and it is a struggle to do. They have all had to take significant amounts of time off work and their own lives.   [facility], social history reviewed and updated at today's visit.   Review of Systems  Constitutional: Positive for activity change and fatigue. Negative for fever, chills and appetite change.  HENT: Negative.   Eyes: Negative.   Respiratory: Negative for cough, chest tightness, shortness of breath and wheezing.   Cardiovascular: Negative for chest pain, palpitations and leg swelling.  Gastrointestinal: Negative for abdominal pain, diarrhea, constipation and abdominal distention.       Moves bowels every  1-2 days, normal  Endocrine: Negative.   Genitourinary: Positive for frequency.  Musculoskeletal: Negative.   Skin: Negative.   Neurological: Positive for weakness.       Behavioral problems, memory impairment  Psychiatric/Behavioral: Positive for behavioral problems, confusion, decreased concentration and agitation. Negative for self-injury and dysphoric mood. The patient is hyperactive. The patient is not nervous/anxious.       Objective:   Physical Exam  Constitutional: He appears well-developed and well-nourished.  HENT:  Head: Normocephalic and atraumatic.  Ears obstructed with cerumen bilaterally  Eyes: EOM are normal.  Neck: Normal range of motion.  Cardiovascular: Normal rate and regular rhythm.   Pulmonary/Chest: Effort normal and breath sounds normal.  Abdominal: Soft. Bowel sounds are normal. He exhibits no distension. There is no tenderness.  Neurological: He is alert. Coordination abnormal.  Uses wheelchair for long distances and not stable when walking short distances. Needs support.   Skin: Skin is warm and dry.  Psychiatric:  Not following our conversation and occasionally follows the conversation.   Filed Vitals:   07/06/14 1525  BP: 140/62  Pulse: 75  Temp: 97.7 F (36.5 C)  TempSrc: Oral  Resp: 18  Height: 5\' 10"  (1.778 m)  Weight: 236 lb 6.4 oz (107.23 kg)  SpO2: 90%      Assessment & Plan:  Visit time 60 minutes and greater than 50% of that was spent in face to face counseling.

## 2014-07-10 NOTE — Telephone Encounter (Signed)
Spoke with Jesus [name]. She saw the results in my chart. Patient is taking the lactulose and seems to be doing better. Jesus [name] wants to know how long the patient needs to be taking lactulose and if he needs to come in to see if the ammonia levels have dropped? Please advise, thanks.

## 2014-07-10 NOTE — Telephone Encounter (Signed)
We would like him to continue taking the lactulose until we see him back. The way that it works is if his liver is not able to clear the ammonia this medicine helps to clear it.

## 2014-07-10 NOTE — Telephone Encounter (Signed)
Spoke with Annice PihJackie. Patient will be back in 3 to 4 weeks for a follow up visit.

## 2014-07-12 ENCOUNTER — Telehealth: Payer: Self-pay | Admitting: *Deleted

## 2014-07-12 NOTE — Telephone Encounter (Signed)
[facility] - Client TELEPHONE ADVICE RECORD [facility] Patient Name: Artelia LarocheORMAN Lazar Gender: Male DOB: 06/18/1928 Age: [age] Y 10 M 8 D Return Phone Number: [phone] (Primary) Address: City/State/Zip: [place] [id number] Client [facility] - Client Client Site  [facility] Night Physician Genella MechKollar, Elizabeth Contact Type Call Caller Name Normam Floria RavelingLang Jr Caller Phone Number [phone] Relationship To Patient Son Is this call to report lab results? No Call Type General Information Initial Comment Caller States his dad needs an FL2 form filled out by Dr. Genella MechElizabeth Kollar General Information Type Other Nurse Assessment Guidelines Guideline Title Affirmed Question Affirmed Notes Nurse Date/Time Lamount Cohen(Eastern Time) Disp. Time Lamount Cohen(Eastern Time) Disposition Final User 07/08/2014 10:15:12 AM General Information Provided Yes Bradley FerrisParis, Julia After Care Instructions Given Call Event Type User Date / Time Description

## 2014-07-13 ENCOUNTER — Telehealth: Payer: Self-pay | Admitting: Internal Medicine

## 2014-07-13 NOTE — Telephone Encounter (Signed)
[facility].  States a nursing home is coming by today and he would like the FL2 back as soon as possible.

## 2014-07-14 ENCOUNTER — Telehealth: Payer: Self-pay | Admitting: Family Medicine

## 2014-07-16 ENCOUNTER — Emergency Department (HOSPITAL_COMMUNITY)
Admission: EM | Admit: 2014-07-16 | Discharge: 2014-07-21 | Disposition: A | Discharge status: Hospice | Payer: Medicare Other | Attending: Emergency Medicine | Admitting: Emergency Medicine

## 2014-07-16 ENCOUNTER — Emergency Department (HOSPITAL_COMMUNITY): Payer: Medicare Other

## 2014-07-16 ENCOUNTER — Encounter (HOSPITAL_COMMUNITY): Payer: Self-pay | Admitting: Emergency Medicine

## 2014-07-16 DIAGNOSIS — Z7982 Long term (current) use of aspirin: Secondary | ICD-10-CM | POA: Insufficient documentation

## 2014-07-16 DIAGNOSIS — Z8639 Personal history of other endocrine, nutritional and metabolic disease: Secondary | ICD-10-CM | POA: Diagnosis not present

## 2014-07-16 DIAGNOSIS — Y92008 Other place in unspecified non-institutional (private) residence as the place of occurrence of the external cause: Secondary | ICD-10-CM | POA: Insufficient documentation

## 2014-07-16 DIAGNOSIS — Z79899 Other long term (current) drug therapy: Secondary | ICD-10-CM | POA: Insufficient documentation

## 2014-07-16 DIAGNOSIS — Z87891 Personal history of nicotine dependence: Secondary | ICD-10-CM | POA: Insufficient documentation

## 2014-07-16 DIAGNOSIS — X789XXA Intentional self-harm by unspecified sharp object, initial encounter: Secondary | ICD-10-CM | POA: Insufficient documentation

## 2014-07-16 DIAGNOSIS — Z9861 Coronary angioplasty status: Secondary | ICD-10-CM | POA: Diagnosis not present

## 2014-07-16 DIAGNOSIS — Y998 Other external cause status: Secondary | ICD-10-CM | POA: Diagnosis not present

## 2014-07-16 DIAGNOSIS — I252 Old myocardial infarction: Secondary | ICD-10-CM | POA: Insufficient documentation

## 2014-07-16 DIAGNOSIS — Z515 Encounter for palliative care: Secondary | ICD-10-CM

## 2014-07-16 DIAGNOSIS — N400 Enlarged prostate without lower urinary tract symptoms: Secondary | ICD-10-CM | POA: Diagnosis not present

## 2014-07-16 DIAGNOSIS — Z872 Personal history of diseases of the skin and subcutaneous tissue: Secondary | ICD-10-CM | POA: Insufficient documentation

## 2014-07-16 DIAGNOSIS — R451 Restlessness and agitation: Secondary | ICD-10-CM | POA: Diagnosis not present

## 2014-07-16 DIAGNOSIS — R4182 Altered mental status, unspecified: Secondary | ICD-10-CM | POA: Diagnosis present

## 2014-07-16 DIAGNOSIS — G629 Polyneuropathy, unspecified: Secondary | ICD-10-CM | POA: Insufficient documentation

## 2014-07-16 DIAGNOSIS — R011 Cardiac murmur, unspecified: Secondary | ICD-10-CM | POA: Insufficient documentation

## 2014-07-16 DIAGNOSIS — F0150 Vascular dementia without behavioral disturbance: Secondary | ICD-10-CM | POA: Diagnosis present

## 2014-07-16 DIAGNOSIS — K219 Gastro-esophageal reflux disease without esophagitis: Secondary | ICD-10-CM | POA: Diagnosis not present

## 2014-07-16 DIAGNOSIS — I2510 Atherosclerotic heart disease of native coronary artery without angina pectoris: Secondary | ICD-10-CM | POA: Insufficient documentation

## 2014-07-16 DIAGNOSIS — S61419A Laceration without foreign body of unspecified hand, initial encounter: Secondary | ICD-10-CM | POA: Insufficient documentation

## 2014-07-16 DIAGNOSIS — Y939 Activity, unspecified: Secondary | ICD-10-CM | POA: Insufficient documentation

## 2014-07-16 DIAGNOSIS — Z66 Do not resuscitate: Secondary | ICD-10-CM | POA: Diagnosis present

## 2014-07-16 DIAGNOSIS — Z951 Presence of aortocoronary bypass graft: Secondary | ICD-10-CM | POA: Insufficient documentation

## 2014-07-16 LAB — URINALYSIS, ROUTINE W REFLEX MICROSCOPIC
BILIRUBIN URINE: NEGATIVE
Glucose, UA: NEGATIVE mg/dL
Hgb urine dipstick: NEGATIVE
Ketones, ur: NEGATIVE mg/dL
Leukocytes, UA: NEGATIVE
Nitrite: NEGATIVE
PROTEIN: NEGATIVE mg/dL
Specific Gravity, Urine: 1.019 (ref 1.005–1.030)
Urobilinogen, UA: 0.2 mg/dL (ref 0.0–1.0)
pH: 5.5 (ref 5.0–8.0)

## 2014-07-16 LAB — COMPREHENSIVE METABOLIC PANEL
ALT: 14 U/L (ref 0–53)
AST: 26 U/L (ref 0–37)
Albumin: 3.6 g/dL (ref 3.5–5.2)
Alkaline Phosphatase: 58 U/L (ref 39–117)
Anion gap: 9 (ref 5–15)
BILIRUBIN TOTAL: 0.3 mg/dL (ref 0.3–1.2)
BUN: 19 mg/dL (ref 6–23)
CALCIUM: 8.9 mg/dL (ref 8.4–10.5)
CO2: 24 mmol/L (ref 19–32)
CREATININE: 1.36 mg/dL — AB (ref 0.50–1.35)
Chloride: 109 mmol/L (ref 96–112)
GFR calc Af Amer: 54 mL/min — ABNORMAL LOW (ref >90)
GFR calc non Af Amer: 46 mL/min — ABNORMAL LOW (ref >90)
Glucose, Bld: 106 mg/dL — ABNORMAL HIGH (ref 70–99)
Potassium: 4.6 mmol/L (ref 3.5–5.1)
SODIUM: 142 mmol/L (ref 135–145)
Total Protein: 6.1 g/dL (ref 6.0–8.3)

## 2014-07-16 LAB — CBC
HCT: 37.2 % — ABNORMAL LOW (ref 39.0–52.0)
Hemoglobin: 11.7 g/dL — ABNORMAL LOW (ref 13.0–17.0)
MCH: 31.5 pg (ref 26.0–34.0)
MCHC: 31.5 g/dL (ref 30.0–36.0)
MCV: 100.3 fL — AB (ref 78.0–100.0)
PLATELETS: 260 10*3/uL (ref 150–400)
RBC: 3.71 MIL/uL — ABNORMAL LOW (ref 4.22–5.81)
RDW: 13.9 % (ref 11.5–15.5)
WBC: 6.2 10*3/uL (ref 4.0–10.5)

## 2014-07-16 LAB — I-STAT CG4 LACTIC ACID, ED: Lactic Acid, Venous: 1.39 mmol/L (ref 0.5–2.0)

## 2014-07-16 MED ORDER — ALPRAZOLAM 0.25 MG PO TABS
0.2500 mg | ORAL_TABLET | Freq: Two times a day (BID) | ORAL | Status: DC | PRN
  Administered 2014-07-17: 0.25 mg via ORAL
  Filled 2014-07-16: qty 1

## 2014-07-16 MED ORDER — ONDANSETRON HCL 4 MG PO TABS: 4.0000 mg | ORAL_TABLET | Freq: Three times a day (TID) | ORAL | Status: DC | PRN

## 2014-07-16 MED ORDER — PANTOPRAZOLE SODIUM 40 MG PO TBEC
40.0000 mg | DELAYED_RELEASE_TABLET | Freq: Every day | ORAL | Status: DC
  Filled 2014-07-16 (×2): qty 1

## 2014-07-16 MED ORDER — ZOLPIDEM TARTRATE 5 MG PO TABS: 5.0000 mg | ORAL_TABLET | Freq: Every evening | ORAL | Status: DC | PRN

## 2014-07-16 MED ORDER — NICOTINE 21 MG/24HR TD PT24
21.0000 mg | MEDICATED_PATCH | Freq: Every day | TRANSDERMAL | Status: DC
  Filled 2014-07-16: qty 1

## 2014-07-16 MED ORDER — ALPRAZOLAM 0.5 MG PO TABS
1.0000 mg | ORAL_TABLET | Freq: Four times a day (QID) | ORAL | Status: DC | PRN
  Administered 2014-07-16: 2 mg via ORAL
  Filled 2014-07-16: qty 4

## 2014-07-16 MED ORDER — QUETIAPINE FUMARATE 25 MG PO TABS
25.0000 mg | ORAL_TABLET | Freq: Every day | ORAL | Status: DC
  Administered 2014-07-16: 25 mg via ORAL
  Filled 2014-07-16: qty 1

## 2014-07-16 MED ORDER — LORAZEPAM 1 MG PO TABS: 1.0000 mg | ORAL_TABLET | Freq: Three times a day (TID) | ORAL | Status: DC | PRN

## 2014-07-16 MED ORDER — ATENOLOL 25 MG PO TABS
25.0000 mg | ORAL_TABLET | Freq: Every day | ORAL | Status: DC
  Filled 2014-07-16 (×2): qty 1

## 2014-07-16 MED ORDER — ALUM & MAG HYDROXIDE-SIMETH 200-200-20 MG/5ML PO SUSP: 30.0000 mL | ORAL | Status: DC | PRN

## 2014-07-16 MED ORDER — ACETAMINOPHEN 325 MG PO TABS: 650.0000 mg | ORAL_TABLET | ORAL | Status: DC | PRN

## 2014-07-16 MED ORDER — TAMSULOSIN HCL 0.4 MG PO CAPS
0.8000 mg | ORAL_CAPSULE | Freq: Every day | ORAL | Status: DC
  Administered 2014-07-16 – 2014-07-19 (×3): 0.8 mg via ORAL
  Filled 2014-07-16 (×4): qty 2

## 2014-07-16 MED ORDER — IBUPROFEN 200 MG PO TABS: 600.0000 mg | ORAL_TABLET | Freq: Three times a day (TID) | ORAL | Status: DC | PRN

## 2014-07-16 MED ORDER — LACTULOSE 10 GM/15ML PO SOLN
10.0000 g | Freq: Two times a day (BID) | ORAL | Status: DC
  Administered 2014-07-17 – 2014-07-18 (×2): 10 g via ORAL
  Filled 2014-07-16 (×5): qty 15

## 2014-07-16 MED ORDER — ASPIRIN 325 MG PO TABS
325.0000 mg | ORAL_TABLET | Freq: Every day | ORAL | Status: DC
  Administered 2014-07-17 – 2014-07-18 (×2): 325 mg via ORAL
  Filled 2014-07-16 (×2): qty 1

## 2014-07-16 NOTE — ED Notes (Signed)
Bed: WA02 Expected date: 07/16/14 Expected time: 6:25 PM Means of arrival: Ambulance Comments: ? UTI eldery

## 2014-07-16 NOTE — BH Assessment (Signed)
This writer attClinical research associateempted to contact pt's son Donata Dufforman Rea Jr to gather additional information. A voice message was left for a return phone call.

## 2014-07-16 NOTE — ED Notes (Addendum)
Pt from home c/o increased agitation since 01/15 after hernia surgery, HX of Dementia. Per EMS from family patient throwing golf balls at window trying to escape house. Foul smelling urine.

## 2014-07-16 NOTE — ED Notes (Signed)
WILL DRAW LABS AFTER PT COMES BACK FROM XRAY

## 2014-07-16 NOTE — ED Notes (Signed)
Patient's son Osa Craverorman Jr. Has established a password for family calling to check on the patient. The password is [email or web address]. Osa CraverNorman Jr, [name] (son), and Annice PihJackie (daughter n law) should be the only family members calling for updates about the patient.

## 2014-07-16 NOTE — BH Assessment (Signed)
Assessment completed. Consulted Maryjean Mornharles Kober, PA-C who recommended inpatient treatment. TTS will contact other facilities for placement. Dr. Gwendolyn GrantWalden has been informed of the recommendation.
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
## 2014-07-16 NOTE — ED Notes (Signed)
MD Walden at bedside 

## 2014-07-16 NOTE — [facility] Counselor (Signed)
Recv'd request for tele-assessment, per nurse request to be seen FF.  This Clinical research associatewriter contacted LS

## 2014-07-17 ENCOUNTER — Other Ambulatory Visit: Payer: Self-pay

## 2014-07-17 DIAGNOSIS — Z515 Encounter for palliative care: Secondary | ICD-10-CM

## 2014-07-17 DIAGNOSIS — R451 Restlessness and agitation: Secondary | ICD-10-CM | POA: Diagnosis present

## 2014-07-17 LAB — VITAMIN B12: VITAMIN B 12: 588 pg/mL (ref 211–911)

## 2014-07-17 MED ORDER — ZIPRASIDONE MESYLATE 20 MG IM SOLR: 10.0000 mg | Freq: Once | INTRAMUSCULAR | Status: DC | PRN

## 2014-07-17 MED ORDER — RISPERIDONE 1 MG PO TBDP
1.0000 mg | ORAL_TABLET | Freq: Two times a day (BID) | ORAL | Status: DC
  Administered 2014-07-17 – 2014-07-20 (×7): 1 mg via ORAL
  Filled 2014-07-17 (×8): qty 1

## 2014-07-17 MED ORDER — ATENOLOL 50 MG PO TABS
50.0000 mg | ORAL_TABLET | Freq: Every day | ORAL | Status: DC
  Administered 2014-07-17: 25 mg via ORAL
  Administered 2014-07-19 – 2014-07-20 (×2): 50 mg via ORAL
  Filled 2014-07-17 (×4): qty 1

## 2014-07-17 MED ORDER — ZIPRASIDONE MESYLATE 20 MG IM SOLR
10.0000 mg | Freq: Once | INTRAMUSCULAR | Status: AC | PRN
  Administered 2014-07-18: 10 mg via INTRAMUSCULAR
  Filled 2014-07-17: qty 20

## 2014-07-17 MED ORDER — ZIPRASIDONE MESYLATE 20 MG IM SOLR
10.0000 mg | Freq: Once | INTRAMUSCULAR | Status: AC
  Administered 2014-07-17: 10 mg via INTRAMUSCULAR
  Filled 2014-07-17: qty 20

## 2014-07-17 MED ORDER — ZIPRASIDONE MESYLATE 20 MG IM SOLR: 10.0000 mg | Freq: Once | INTRAMUSCULAR | Status: DC

## 2014-07-17 MED ORDER — VITAMIN B-1 100 MG PO TABS
100.0000 mg | ORAL_TABLET | Freq: Every day | ORAL | Status: DC
  Administered 2014-07-17 – 2014-07-18 (×2): 100 mg via ORAL
  Filled 2014-07-17 (×2): qty 1

## 2014-07-17 MED ORDER — ALPRAZOLAM 0.5 MG PO TABS
1.0000 mg | ORAL_TABLET | Freq: Every day | ORAL | Status: DC
  Administered 2014-07-17 – 2014-07-19 (×3): 1 mg via ORAL
  Filled 2014-07-17 (×3): qty 2

## 2014-07-17 MED ORDER — ACETAMINOPHEN 500 MG PO TABS
1000.0000 mg | ORAL_TABLET | Freq: Three times a day (TID) | ORAL | Status: DC
  Administered 2014-07-17 – 2014-07-19 (×5): 1000 mg via ORAL
  Filled 2014-07-17 (×5): qty 2

## 2014-07-17 NOTE — ED Notes (Signed)
Psychiatry at bedside.

## 2014-07-17 NOTE — ED Provider Notes (Signed)
LATE ENTRY:  At 2 am i was asked to assess pt. Pt is agitated, restless, and wantig to walk around the hallway, but is unsteady. He is confused and was aggressive towards the staff. Suspect sun downing effects. Pt will not take oral meds. Attempted to calm pt down and got him to his room, and he agreed to sleep. Few minutes after though, i was asked to see pt again, as he was walking out. Geodon im ordered, and administered. Might need more im meds. Few minutes later, RN reports that geodon hasn't worked yet, and pt is trying to get out of the bed. Soft restraints applied for patient safety reasons.  Derwood KaplanAnkit Yuko Coventry, MD 07/17/14 [phone]

## 2014-07-17 NOTE — ED Notes (Signed)
Patient is very agitated and restless. He is not following any commands. Patient is very unsteady on his feet and does not want staff to assist. The patient is disoriented to time, place and situation despite repetitive orientation. Patient will not stay in bed nor he will he stay seated. He is cursing, pushing, and swinging at staff as we try to re-direct

## 2014-07-17 NOTE — Consult Note (Signed)
[facility] Face-to-Face Psychiatry Consult   Reason for Consult:  Agitation Referring Physician:  EDP Patient Identification: Jesus [name] MRN:  [id number] Principal Diagnosis: Agitation Diagnosis:   Patient Active Problem List   Diagnosis Date Noted  . Palliative care patient [Z51.5] 07/17/2014    Priority: High  . Agitation [R45.1] 07/17/2014    Priority: High  . Vascular dementia of acute onset [F01.50] 06/25/2014    Priority: High  . DNR (do not resuscitate) [Z66] 06/23/2014    Priority: High  . Coronary artery disease [I25.10]     Priority: High  . BPH associated with nocturia [N40.1, R35.1] 06/25/2014  . Delirium due to multiple etiologies [F05] 06/21/2014  . Idiopathic peripheral neuropathy [G60.9] 06/21/2014  . Non-sustained ventricular tachycardia [I47.2] 06/17/2014  . Alcohol withdrawal delirium [F10.231] 06/16/2014  . Ventral hernia without obstruction or gangrene [K43.9] 06/09/2014  . Aortic stenosis [I35.0] 07/13/2013  . Coronary atherosclerosis of native coronary artery [I25.10] 07/05/2013  . Essential hypertension, benign [I10] 07/05/2013  . Hyperlipidemia [E78.5] 07/05/2013  . Onychomycosis [B35.1] 02/11/2013    Total Time spent with patient: 45 minutes  Subjective:   Jesus [name] is a 81 y.o. male patient will transfer to a SNF or Memory Care Unit.  HPI:  The patient was cleared by the medical EDPs and was sent to psychiatry for agitation.  He had some underlying dementia prior to a major surgery in January for a hernia.  Jesus [name] developed delirium after the anesthesia, morphine, urinary tract infection, and other medications.  Since his discharge he has required 24 hour care with increase in aggression, swinging golf clubs at people.  Remains confused and agitated.  Palliative care is following the patient and placed orders.  Recommended gero-psychiatry but family declined. HPI Elements:   Location:  generalized. Quality:  acute. Severity:  severe. Timing:   constant. Duration:  since January. Context:  operation.  Past Medical History:  Past Medical History  Diagnosis Date  . Aortic stenosis, mild   . Dyslipidemia   . Coronary artery disease     CABG 2005, repeat cath with severe 2 vessel ASCAD with high grade stenosis of LAD, patent LIMA to LAD, patent SVG too diag, mildly atretic but widely patent SVG to RCA and high grade stenosis 90% ostial left circ with left dominant with PDA coming off of the left circ s/p rotational atherectomy and cutting balloon PCI with stent to prox left circ  . Heart murmur dx'd 1948  . History of blood transfusion     "related to OR"  . GERD (gastroesophageal reflux disease)   . Duodenal ulcer 1949  . Arthritis     "fingers" (12/14/2013)  . Cellulitis of foot, left 12/13/2013  . Myocardial infarction     age 33   . Neuromuscular disorder     neuropathy left foot  . Nocturia   . Frequency   . Enlarged prostate   . Ventral hernia   . Cellulitis of foot, left 12/13/2013  . Incisional hernia, without obstruction or gangrene 11/15/2013  . Pain in lower limb 07/15/2013  . Paronychia of great toe of left foot 11/12/2012    Past Surgical History  Procedure Laterality Date  . Prostate biopsy    . Cholecystectomy  1972  . Abdominal exploration surgery    . Cataract extraction w/ intraocular lens  implant, bilateral Bilateral   . Appendectomy    . Coronary angioplasty with stent placement  12/18/2011    "1"  . Tonsillectomy    .  Percutaneous coronary stent intervention (pci-s) N/A 12/18/2011    Procedure: PERCUTANEOUS CORONARY STENT INTERVENTION (PCI-S);  Surgeon: Corky Crafts, MD;  Location: [facility] CATH LAB;  Service: Cardiovascular;  Laterality: N/A;  . Carotid endarterectomy      rt side  . Coronary artery bypass graft  2003    "CABG X3"  . Ventral hernia repair N/A 06/09/2014    Procedure: LAPAROSCOPIC ASSISTED OPEN VENTRAL HERNIA REPAIR;  Surgeon: Ovidio Kin, MD;  Location: [facility] ORS;  Service: General;   Laterality: N/A;  WITH MESH  . Laparoscopic lysis of adhesions N/A 06/09/2014    Procedure: LAPAROSCOPIC LYSIS OF ADHESIONS;  Surgeon: Ovidio Kin, MD;  Location: [facility] ORS;  Service: General;  Laterality: N/A;   Family History:  Family History  Problem Relation Age of Onset  . Heart attack Father   . Arrhythmia Mother    Social History:  History  Alcohol Use  . 2.4 oz/week  . 4 Shots of liquor per week    Comment: 12/14/2013 "~ 4oz scotch/wk"     History  Drug Use No    History   Social History  . Marital Status: Widowed    Spouse Name: N/A  . Number of Children: N/A  . Years of Education: N/A   Social History Main Topics  . Smoking status: Former Smoker -- 0.12 packs/day for 2 years    Types: Cigarettes    Quit date: 06/20/1979  . Smokeless tobacco: Never Used  . Alcohol Use: 2.4 oz/week    4 Shots of liquor per week     Comment: 12/14/2013 "~ 4oz scotch/wk"  . Drug Use: No  . Sexual Activity: No   Other Topics Concern  . None   Social History Narrative   Additional Social History:    History of alcohol / drug use?: No history of alcohol / drug abuse                     Allergies:   Allergies  Allergen Reactions  . Morphine And Related Anxiety    Delirium exacerbated by use of opiates, responds best to fentanyl for acute pain  . Cardura [Doxazosin Mesylate]     Hypotenstion  . Gabapentin     Weight gain    Vitals: Blood pressure 163/70, pulse 84, temperature 98.2 F (36.8 C), temperature source Oral, resp. rate 18, SpO2 93 %.  Risk to Self: Suicidal Ideation: No Suicidal Intent: No Is patient at risk for suicide?: No Suicidal Plan?: No-Not Currently/Within Last 6 Months Access to Means: No What has been your use of drugs/alcohol within the last 12 months?: No drug or alcohol use reported at this time. How many times?: 0 Other Self Harm Risks: No other self harm risk identified at this time.  Triggers for Past Attempts: None  known Intentional Self Injurious Behavior: None Risk to Others: Homicidal Ideation: No Thoughts of Harm to Others: No Current Homicidal Intent: No Current Homicidal Plan: No Access to Homicidal Means: No Identified Victim: NA History of harm to others?: No Assessment of Violence: On admission Violent Behavior Description: No violent behaviors observed at this time. Pt is calm and cooperative.  Does patient have access to weapons?: No Criminal Charges Pending?: No Does patient have a court date: No Prior Inpatient Therapy: Prior Inpatient Therapy: No Prior Outpatient Therapy: Prior Outpatient Therapy: No  Current Facility-Administered Medications  Medication Dose Route Frequency Provider Last Rate Last Dose  . acetaminophen (TYLENOL) tablet 1,000 mg  1,000  mg Oral TID Edsel PetrinElizabeth L Golding, DO   1,000 mg at 07/17/14 1528  . ALPRAZolam Prudy Feeler(XANAX) tablet 0.25 mg  0.25 mg Oral BID PRN Elwin MochaBlair Walden, MD   0.25 mg at 07/17/14 1729  . ALPRAZolam Prudy Feeler(XANAX) tablet 1 mg  1 mg Oral QHS Edsel PetrinElizabeth L Golding, DO      . ALPRAZolam Prudy Feeler(XANAX) tablet 1-2 mg  1-2 mg Oral Q6H PRN Elwin MochaBlair Walden, MD   2 mg at 07/16/14 2210  . alum & mag hydroxide-simeth (MAALOX/MYLANTA) 200-200-20 MG/5ML suspension 30 mL  30 mL Oral PRN Elwin MochaBlair Walden, MD   30 mL at 07/16/14 2327  . aspirin tablet 325 mg  325 mg Oral Daily Elwin MochaBlair Walden, MD   325 mg at 07/17/14 1531  . [START ON 07/18/2014] atenolol (TENORMIN) tablet 50 mg  50 mg Oral Daily Edsel PetrinElizabeth L Golding, DO   25 mg at 07/17/14 1530  . lactulose (CHRONULAC) 10 GM/15ML solution 10 g  10 g Oral BID Elwin MochaBlair Walden, MD   10 g at 07/16/14 2328  . nicotine (NICODERM CQ - dosed in mg/24 hours) patch 21 mg  21 mg Transdermal Daily Elwin MochaBlair Walden, MD   21 mg at 07/16/14 2213  . ondansetron (ZOFRAN) tablet 4 mg  4 mg Oral Q8H PRN Elwin MochaBlair Walden, MD      . pantoprazole (PROTONIX) EC tablet 40 mg  40 mg Oral Daily Elwin MochaBlair Walden, MD   40 mg at 07/16/14 2130  . risperiDONE (RISPERDAL M-TABS)  disintegrating tablet 1 mg  1 mg Oral BID Edsel PetrinElizabeth L Golding, DO   1 mg at 07/17/14 1639  . tamsulosin (FLOMAX) capsule 0.8 mg  0.8 mg Oral QHS Elwin MochaBlair Walden, MD   0.8 mg at 07/16/14 2210  . thiamine (VITAMIN B-1) tablet 100 mg  100 mg Oral Daily Edsel PetrinElizabeth L Golding, DO   100 mg at 07/17/14 1528  . ziprasidone (GEODON) injection 10 mg  10 mg Intramuscular Once PRN Nanine MeansJamison Lord, NP       Current Outpatient Prescriptions  Medication Sig Dispense Refill  . atenolol (TENORMIN) 25 MG tablet Take 1 tablet (25 mg total) by mouth daily. 30 tablet 6  . chlorhexidine (PERIDEX) 0.12 % solution Use as directed 15 mLs in the mouth or throat 2 (two) times daily. 120 mL 3  . lactulose (CHRONULAC) 10 GM/15ML solution Take 15 mLs (10 g total) by mouth 2 (two) times daily. Stop or decrease for diarrhea 946 mL 0  . pantoprazole (PROTONIX) 40 MG tablet Take 40 mg by mouth daily.    . tamsulosin (FLOMAX) 0.4 MG CAPS capsule Take 2 capsules (0.8 mg total) by mouth at bedtime. 60 capsule 6  . triamcinolone cream (KENALOG) 0.1 % Apply 1 application topically 2 (two) times daily.    Marland Kitchen. acetaminophen (TYLENOL) 325 MG tablet Take 2 tablets (650 mg total) by mouth 4 (four) times daily.    Marland Kitchen. ALPRAZolam (XANAX) 0.25 MG tablet Take 1 tablet (0.25 mg total) by mouth 2 (two) times daily as needed for anxiety. 60 tablet 0  . ALPRAZolam (XANAX) 1 MG tablet Take 1-2 tablets (1-2 mg total) by mouth every 6 (six) hours as needed for anxiety or sleep (agitation). (Patient not taking: Reported on 07/17/2014) 90 tablet 0  . aspirin 325 MG tablet Take 1 tablet (325 mg total) by mouth daily.    Marland Kitchen. b complex vitamins tablet Take 1 tablet by mouth daily.    . folic acid (FOLVITE) 1 MG tablet Take 1 tablet (1 mg  total) by mouth daily. 30 tablet 1  . Multiple Vitamins-Minerals (MULTIVITAMIN WITH MINERALS) tablet Take 1 tablet by mouth daily.    . QUEtiapine (SEROQUEL) 25 MG tablet Take 1 tablet (25 mg total) by mouth at bedtime. 60 tablet 0  .  senna-docusate (SENOKOT-S) 8.6-50 MG per tablet Take 1 tablet by mouth at bedtime. 30 tablet 0  . thiamine 100 MG tablet Take 1 tablet (100 mg total) by mouth daily. (Patient not taking: Reported on 07/06/2014) 30 tablet 0    Musculoskeletal: Strength & Muscle Tone: within normal limits Gait & Station: unsteady Patient leans: N/A  Psychiatric Specialty Exam:     Blood pressure 163/70, pulse 84, temperature 98.2 F (36.8 C), temperature source Oral, resp. rate 18, SpO2 93 %.There is no weight on file to calculate BMI.  General Appearance: Casual  Eye Contact::  Good  Speech:  Normal Rate  Volume:  Normal  Mood:  Anxious  Affect:  Blunt  Thought Process:  Coherent  Orientation:  To person  Thought Content:  difficult to assess due to state of delirium  Suicidal Thoughts:  No  Homicidal Thoughts:  No  Memory:  Immediate;   Poor Recent;   Poor Remote;   Poor  Judgement:  Impaired  Insight:  Lacking  Psychomotor Activity:  Increased  Concentration:  Poor  Recall:  Poor  Fund of Knowledge:Fair  Language: Fair  Akathisia:  No  Handed:  Right  AIMS (if indicated):     Assets:  Health and safety inspector Housing Leisure Time Resilience Social Support  ADL's:  Impaired  Cognition: Impaired,  Severe  Sleep:      Medical Decision Making: Review of Psycho-Social Stressors (1), Review or order clinical lab tests (1) and Review of Medication Regimen & Side Effects (2)  Treatment Plan Summary: Daily contact with patient to assess and evaluate symptoms and progress in treatment, Medication management and Plan discharge to SNF or Memory Care facility, family declinced gero-psychiatry  Plan:  Supportive therapy provided about ongoing stressors. Disposition: Discharge to SNF or Memory Care facility, family declinced gero-psychiatry  Nanine Means, PMH-NP 07/17/2014 5:31 PM Patient seen face-to-face for psychiatric evaluation, chart reviewed and case discussed with the physician  extender and developed treatment plan. Reviewed the information documented and agree with the treatment plan. Thedore Mins, MD

## 2014-07-17 NOTE — ED Provider Notes (Signed)
12:20 PM EKG obtained- had been ordered last night, but parent was agitated and EKG was not able to be performed.  No acute symptoms now.   Date: 07/17/2014  Rate: 78  Rhythm: normal sinus rhythm  QRS Axis: normal  Intervals: normal  ST/T Wave abnormalities: nonspecific ST/T changes  Conduction Disutrbances:none  Narrative Interpretation: poor r wave progression  Old EKG Reviewed: none available   Ethelda ChickMartha K Linker, MD 07/17/14 1221

## 2014-07-17 NOTE — Progress Notes (Addendum)
  CARE MANAGEMENT ED NOTE 07/17/2014  Patient:  Jesus [name],Jesus [name]   Account Number:  [id number]  Date Initiated:  07/17/2014  Documentation initiated by:  Edd ArbourGIBBS,Vinayak Bobier  Subjective/Objective Assessment:   85 yr [employer] medicare from home c/o increased agitation since 01/15 after hernia surgery, HX of Dementia. Per EMS from family patient throwing golf balls at window trying to escape house. Foul smelling urine. UA WNL in ED     Subjective/Objective Assessment Detail:   pcp elizabeth kollar  Dr [name] states pt is "not safe for discharge home" and reports the family have worked with pcp on FL2 for [facility]  Reports she generally does not admit but interested in admitting pt as obs for AMS, labs b12 etoh withdrawal and to do MRI under sedation States pt with" TIAs" may be transition to hospice center  States pt does not need geripsych as recommended  [facility] evaluation     Action/Plan:   Pt discussed [facility] progression meeting Recommended pt for geripsych facility placement but family is insisting on following the recommendations of Dr Anderson MaltaElizabeth Golding. Palliative care order entered in EPIC by SAPPU NP/PA ED SW paged Dr   Action/Plan Detail:   [name], then Dr Greig RightLampkin on call palliative care 1342 Reviewed with Dr Jacky KindleAronson for admission status.  [id number] Spoke with Dr [name] about completing further evaluation in ED [phone] & cm call to Dr Ricci BarkerAronson,ED RN updated   Anticipated DC Date:       Status Recommendation to Physician:   Result of Recommendation:    Other ED Services  Consult Working Plan   In-house referral  Clinical Social Worker   DC Associate Professorlanning Services  Other  Outpatient Services - Pt will follow up    Choice offered to / List presented to:            Status of service:  Completed, signed off  ED Comments:   ED Comments Detail:   07/17/14 1529 Unable to locate room of family conference TCU  RN/CNAs states family is having with Dr [name] and SW

## 2014-07-17 NOTE — Progress Notes (Signed)
1645pm EDCM spoke to Dr. [name] regarding plan of care for patient.  Per Dr. [name], patient's family refusing to take patient home due to safety.  Patient's family have already been in contact with [facility] and would like patient to be on memory care unit with hospice.  Dr. [name] requesting someone from the [facility] to evaluate patient for hospice facility.  EDCM will call [facility] to place request and to inquire what information they need for hospice referral. 1649pm Spoke to Seaside ParkNadine of the [facility] who tried to connect [facility] with [facility] without success.  EDCM left message with Fabiola BackerMisty Davis of Palliative care of [facility] informing her of request of evaluation for hospice facility and if patient requires a hospice referral.  [facility] left phone number for call back. 1710pm EDCM spoke to Jonny RuizJohn of the [facility] who will place call to on call RN to call [facility] back regarding this patient.  Per previous conversation with Dr. [name], if on call RN of [facility] needs to speak with her, [facility] may provide [facility] with her cell phone number. 1727pm EDCM spoke to on call administrative RN Georgena Spurlingrish Cooper who reports the patient is not seen under palliative care in their system.  [facility] Trish, but family said it was too soon for hospice services so [facility] backed out. Trish RN reports patient will not qualify for hospice facility unless patient has less than "weeks" to live.  Per Rosann Auerbachrish, patient will definitely need new hospice referral.  RN Rosann Auerbachrish will call Dr. [name] this evening to discuss plan of care and will [facility] with further instruction.

## 2014-07-17 NOTE — ED Notes (Signed)
Patient combative and at risk for personal injury. New orders obtained for safety.

## 2014-07-17 NOTE — Progress Notes (Signed)
CSW spoke with Candace of [facility] who states that the pt has been offered a bed at the facility. She says that the pt can either go to the SNF unit or the memory care unit.  CSW informed candace that the pt and pt's doctor are interested in going to the memory care unit at this time. Candace stated that this would be fine. She says that the facility could officially admit the pt into the facility tomorrow morning.  Candace of [facility] stated that they do not need anymore documentation at this time. She says that they will be ready for nurse report in the morning.  CSW made Palliative Medicone Physician an aware of bed acceptance.   Trish MageBrittney Areana Kosanke, LCSWA [phone] ED CSW 07/17/2014 4:49 PM

## 2014-07-17 NOTE — Progress Notes (Signed)
[facility] at 2044pm with confirmation of receipt at 2049pm.

## 2014-07-17 NOTE — ED Notes (Addendum)
Pt refusing medications at this time, thinks he already took medications today. Attempted to take off one restraint, but pt started trying to get our of bed and take off other restraint. Wrist restraint reapplied. Family arrived at bedside. Returned checkbook to family.

## 2014-07-17 NOTE — ED Notes (Addendum)
Dr. Rhunette CroftNanavati and other nursing staff called to assist with patient.

## 2014-07-17 NOTE — Progress Notes (Signed)
1820pm EDCM received phone call back from RN Georgena Spurlingrish Cooper [facility] who reports she has spoken in length to Dr [name] about the patient.  Trish reports [facility]  will be sending out someone tomorrow to evaluate the patient for hospice facility.  [facility] order from [facility] and will be faxing hospice referral to [facility] at fax number [phone].

## 2014-07-17 NOTE — Progress Notes (Signed)
EDCM spoke to patient's son Jesus Craverorman Jr. Who provided the proper password [email or web address].  Jesus Covertorman Jr's phone number is [phone].  Jesus Covertorman also reports that patient's son Jesus [name] can be called if we are unable to reach Jesus [name].  Jesus [name] is aware of the password per Jesus CovertNorman.  Warren's phone number is [phone].  EDCM updated patient's son Jesus Covertorman regarding hospice referral to [facility] and that they will be coming to evaluate the patient tomorrow morning for hospice facility placement.  Patient's son Jesus Craverorman Jr. Agreeable to above.  No further EDCM needs at this time.

## 2014-07-17 NOTE — Progress Notes (Signed)
[name] is a patient followed by the Palliative Medicine team since his last admission on 06/09/2014 for elective ventral hernia repair. He had a prolonged (12 day) post-operative delirium-probably multifactorial including uncontrolled pain, anesthesia, multiple medications, UTI diagnosis delayed, urinary retention, constipation to name a few. He improved dramatically after UTI was treated and only a very minimal medication list. He had a history of alcohol use every evening prior to admission- unclear if this has continued after hospitalization. Once home he did fairly well with good days and bad days but had issues consistently with sundowning and short term memory loss. He has responded best to benzodiazepines which I explained to family have their own set of issues around delirium. Chronic delirium and sub-actue delirium is extremely difficult to manage long term -I have discussed with them that he will probably not ever be back to being independent or be the person he was prior to his surgery. While family reports he was fully functional prior to his surgery in January he probably now has acute onset Vascular Dementia-certainly his CT would support that with atrophy and WMD.   Request primary service consider palliative consultation for goals of care  Complete full work up for dementia- he has a macrocytosis and may have B12/Folic acid deficiency and replace thiamine.  Primary care physician is Genella MechElizabeth Kollar, MD. [facility].  Anderson MaltaElizabeth Golding, DO Palliative Medicine [phone]

## 2014-07-17 NOTE — Progress Notes (Signed)
Family meeting today at 230PM. Jesus [name] has declined significantly over the past month. He requires full assist with all ADLs-he is in general combative and difficult to manage at home where family has 24/7 in home caregivers. Last PM Jesus [name] became extremely agitated and combative and family felt for his safety he needed to be taken to ED and evaluated and treated if there was a reversible cause of his condition. Medical work-up has largely been negative- Jesus [name] appears much weaker to me than a month ago- he appears clinically dehydrated and appears to have lost weight. Per family he requires constant prompting to drink fluids and often refuses. He has also been refusing medication at home.  Jesus [name] is now going on 6 weeks of progressive and irreversible delirium-that puts is 6-12 month mortality >50%. He has multiple other chronic diseases including a prior inferior MI, moderate aortic stenosis, severe neuropathy and severe BPH causing urinary retention.  Family feel strongly that he needs to be kept comfortable and have his dignity preserved- they are not interested in geropsych given irreversible nature of his condition. They agree to hospice care and have already been evaluated at home by [facility].   I think it is very reasonable to explore the option of memory care and hospice together to serve him and his he continues to require sedation and not receive PO or IV support then he may actually need Hospice House. Would request that Hospice House evaluate him for terminal agitation/sedation-this is very much in line with family goals of care and patient's previously stated directives.  I advised on symptom management.  I stopped all PRNs like Ambien, Ativan, Seroquel, Benedryl that may potentiate delirium.  Checked a B12 level since he has peripheral neuropathy and macrocytic anemia-doubt this would be reversible at this point.  Family agree on next steps. Ideally he needs to be in a hospital room  with windows to orient to day and night-advised he doesn't meet observation hospital admission criteria for agitated delirium. I doubt he will be able to take PO's-no IV access.   Use Geodon IM for agitation, scheduled xanax qhs- he has done well with this historically. Fentanyl for pain and scheduled tylenol.   Advised again no restraints- I think his current meds seem to be helping-he is calm for now.  Anderson MaltaElizabeth Kimberley Speece, DO Palliative Medicine [phone] or [phone](cell)

## 2014-07-17 NOTE — ED Notes (Signed)
Patient is still very active in restraints. He continues to pull linen off of his bed while trying to slide to the end of the bed

## 2014-07-17 NOTE — ED Notes (Signed)
Patient is not cooperating with staff. He has removed all EKG stickers. Nursing staff unable to obtain EKG. Dr. Rhunette CroftNanavati notified.

## 2014-07-17 NOTE — Progress Notes (Signed)
CSW received call back from [facility] who states that one of the nurses at their facility will come and evaluate patient.   CSW will reach out to other facilities with memory care units at this time.   Trish MageBrittney Kemberly Taves, LCSWA [phone] ED CSW 07/17/2014 5:47 PM

## 2014-07-17 NOTE — ED Notes (Signed)
Patient has not been asleep at any time tonight. He has his eyes closed most of the time but continues to pull at his clothing and bite at the wrist restraints. He has been release 2-3 times for repositioning and personal hygiene and persistently attempts to get out of bed.

## 2014-07-17 NOTE — Progress Notes (Signed)
Per discussion with Dr. [name], patient to be admitted for further work up related to altered mental status. Per Dr. [name], MD to meet with pt family around 230pm.   [name], LCSW [phone]  ED CSW 07/17/2014 2:16 PM

## 2014-07-17 NOTE — Progress Notes (Signed)
EDCM attempted to call patient's son [name] with updates without success.

## 2014-07-17 NOTE — ED Notes (Signed)
Jesus CraverNorman Jr. Called to check on the patient and see how he faired through the night. He stated that during the patient's stay here in January there was a palliative doctor name Dr. [name] who was very helpful in trying to guide the family in taking care of the patient at home. The son states he wants this physician leading his care. It was explained that this a mental health admission and his care will likely be led by the psychiatric team but we would be glad to communicate his request in the patient's medical record. Son's number is [phone]. He is out of town today, but his brother Jesus [name] and sister n law Jesus [name] will be up today.

## 2014-07-17 NOTE — ED Notes (Signed)
Pt restless and being aggressive towards staff, pt trying to hit and kick at staff, cussing towards staff.

## 2014-07-17 NOTE — ED Notes (Signed)
Pt combative when brief changed.

## 2014-07-17 NOTE — Progress Notes (Signed)
CSW reached out to Dr. [name], regarding recommendations and pt family wishes. Per psychiatrist and Np, patient recommended for inpatient geriatric treatment.   Byrd HesselbachKristen Kerstie Agent, LCSW [phone]  ED CSW 07/17/2014 12:09 PM

## 2014-07-18 ENCOUNTER — Other Ambulatory Visit: Payer: Self-pay | Admitting: Internal Medicine

## 2014-07-18 ENCOUNTER — Ambulatory Visit: Payer: Medicare Other | Admitting: Cardiology

## 2014-07-18 MED ORDER — LORAZEPAM 2 MG/ML PO CONC: 2.0000 mg | Freq: Every day | ORAL | Status: DC

## 2014-07-18 MED ORDER — RISPERIDONE 1 MG PO TBDP: 1.0000 mg | ORAL_TABLET | Freq: Two times a day (BID) | ORAL | Status: DC

## 2014-07-18 MED ORDER — CYANOCOBALAMIN 1000 MCG/ML IJ SOLN
1000.0000 ug | INTRAMUSCULAR | Status: DC
  Filled 2014-07-18: qty 1

## 2014-07-18 MED ORDER — OLANZAPINE 10 MG PO TBDP: 10.0000 mg | ORAL_TABLET | Freq: Every day | ORAL | Status: DC

## 2014-07-18 MED ORDER — SENNOSIDES-DOCUSATE SODIUM 8.6-50 MG PO TABS: 2.0000 | ORAL_TABLET | Freq: Every day | ORAL | Status: AC

## 2014-07-18 MED ORDER — ATENOLOL 50 MG PO TABS: 50.0000 mg | ORAL_TABLET | Freq: Every day | ORAL | Status: AC

## 2014-07-18 MED ORDER — LORAZEPAM 2 MG/ML PO CONC
1.0000 mg | ORAL | Status: DC | PRN
  Administered 2014-07-18 – 2014-07-20 (×2): 1 mg via ORAL
  Filled 2014-07-18 (×2): qty 1
  Filled 2014-07-18: qty 0.5

## 2014-07-18 MED ORDER — LORAZEPAM 2 MG/ML PO CONC: 1.0000 mg | ORAL | Status: DC | PRN

## 2014-07-18 MED ORDER — OLANZAPINE 10 MG PO TBDP
10.0000 mg | ORAL_TABLET | Freq: Every day | ORAL | Status: DC
  Administered 2014-07-18 – 2014-07-19 (×2): 10 mg via ORAL
  Filled 2014-07-18 (×2): qty 1

## 2014-07-18 MED ORDER — LORAZEPAM 2 MG/ML PO CONC
2.0000 mg | Freq: Every day | ORAL | Status: DC
  Administered 2014-07-18 – 2014-07-19 (×2): 2 mg via ORAL
  Filled 2014-07-18 (×2): qty 1

## 2014-07-18 MED ORDER — CYANOCOBALAMIN 1000 MCG/ML IJ SOLN: 1000.0000 ug | INTRAMUSCULAR | Status: DC

## 2014-07-18 MED ORDER — LORAZEPAM 2 MG/ML IJ SOLN
1.0000 mg | Freq: Once | INTRAMUSCULAR | Status: AC
  Administered 2014-07-18: 1 mg via INTRAMUSCULAR
  Filled 2014-07-18: qty 1

## 2014-07-18 MED ORDER — HALOPERIDOL LACTATE 5 MG/ML IJ SOLN
2.0000 mg | Freq: Once | INTRAMUSCULAR | Status: AC
  Administered 2014-07-18: 2 mg via INTRAMUSCULAR
  Filled 2014-07-18: qty 1

## 2014-07-18 MED ORDER — NICOTINE 14 MG/24HR TD PT24: 14.0000 mg | MEDICATED_PATCH | Freq: Every day | TRANSDERMAL | Status: DC

## 2014-07-18 MED ORDER — SENNOSIDES-DOCUSATE SODIUM 8.6-50 MG PO TABS
2.0000 | ORAL_TABLET | Freq: Every day | ORAL | Status: DC
  Administered 2014-07-18 – 2014-07-19 (×2): 2 via ORAL
  Filled 2014-07-18 (×2): qty 2

## 2014-07-18 MED ORDER — ACETAMINOPHEN 500 MG PO TABS: 1000.0000 mg | ORAL_TABLET | Freq: Three times a day (TID) | ORAL | Status: DC

## 2014-07-18 NOTE — Consult Note (Signed)
[facility] Face-to-Face Psychiatry Consult   Reason for Consult:  Agitation Referring Physician:  EDP Patient Identification: Jesus [name] MRN:  [id number] Principal Diagnosis: Agitation Diagnosis:   Patient Active Problem List   Diagnosis Date Noted  . Palliative care patient [Z51.5] 07/17/2014  . Agitation [R45.1] 07/17/2014  . Vascular dementia of acute onset [F01.50] 06/25/2014  . BPH associated with nocturia [N40.1, R35.1] 06/25/2014  . DNR (do not resuscitate) [Z66] 06/23/2014  . Delirium due to multiple etiologies [F05] 06/21/2014  . Idiopathic peripheral neuropathy [G60.9] 06/21/2014  . Non-sustained ventricular tachycardia [I47.2] 06/17/2014  . Alcohol withdrawal delirium [F10.231] 06/16/2014  . Ventral hernia without obstruction or gangrene [K43.9] 06/09/2014  . Aortic stenosis [I35.0] 07/13/2013  . Coronary artery disease [I25.10]   . Coronary atherosclerosis of native coronary artery [I25.10] 07/05/2013  . Essential hypertension, benign [I10] 07/05/2013  . Hyperlipidemia [E78.5] 07/05/2013  . Onychomycosis [B35.1] 02/11/2013    Total Time spent with patient: 25 minutes  Subjective:   Jesus [name] is a 81 y.o. male patient will transfer to a SNF or Memory Care Unit.Pt continues to meet criteria for inpatient placement.   HPI:  The patient was cleared by the medical EDPs and was sent to psychiatry for agitation.  He had some underlying dementia prior to a major surgery in January for a hernia.  [name] developed delirium after the anesthesia, morphine, urinary tract infection, and other medications.  Since his discharge he has required 24 hour care with increase in aggression, swinging golf clubs at people.  Remains confused and agitated.  Palliative care is following the patient and placed orders.  Recommended gero-psychiatry but family declined.  HPI Elements:   Location:  generalized. Quality:  acute. Severity:  severe. Timing:  constant. Duration:  since  January. Context:  operation.  Past Medical History:  Past Medical History  Diagnosis Date  . Aortic stenosis, mild   . Dyslipidemia   . Coronary artery disease     CABG 2005, repeat cath with severe 2 vessel ASCAD with high grade stenosis of LAD, patent LIMA to LAD, patent SVG too diag, mildly atretic but widely patent SVG to RCA and high grade stenosis 90% ostial left circ with left dominant with PDA coming off of the left circ s/p rotational atherectomy and cutting balloon PCI with stent to prox left circ  . Heart murmur dx'd 1948  . History of blood transfusion     "related to OR"  . GERD (gastroesophageal reflux disease)   . Duodenal ulcer 1949  . Arthritis     "fingers" (12/14/2013)  . Cellulitis of foot, left 12/13/2013  . Myocardial infarction     age 60   . Neuromuscular disorder     neuropathy left foot  . Nocturia   . Frequency   . Enlarged prostate   . Ventral hernia   . Cellulitis of foot, left 12/13/2013  . Incisional hernia, without obstruction or gangrene 11/15/2013  . Pain in lower limb 07/15/2013  . Paronychia of great toe of left foot 11/12/2012    Past Surgical History  Procedure Laterality Date  . Prostate biopsy    . Cholecystectomy  1972  . Abdominal exploration surgery    . Cataract extraction w/ intraocular lens  implant, bilateral Bilateral   . Appendectomy    . Coronary angioplasty with stent placement  12/18/2011    "1"  . Tonsillectomy    . Percutaneous coronary stent intervention (pci-s) N/A 12/18/2011    Procedure: PERCUTANEOUS  CORONARY STENT INTERVENTION (PCI-S);  Surgeon: Corky Crafts, MD;  Location: [facility] CATH LAB;  Service: Cardiovascular;  Laterality: N/A;  . Carotid endarterectomy      rt side  . Coronary artery bypass graft  2003    "CABG X3"  . Ventral hernia repair N/A 06/09/2014    Procedure: LAPAROSCOPIC ASSISTED OPEN VENTRAL HERNIA REPAIR;  Surgeon: Ovidio Kin, MD;  Location: [facility] ORS;  Service: General;  Laterality: N/A;  WITH  MESH  . Laparoscopic lysis of adhesions N/A 06/09/2014    Procedure: LAPAROSCOPIC LYSIS OF ADHESIONS;  Surgeon: Ovidio Kin, MD;  Location: [facility] ORS;  Service: General;  Laterality: N/A;   Family History:  Family History  Problem Relation Age of Onset  . Heart attack Father   . Arrhythmia Mother    Social History:  History  Alcohol Use  . 2.4 oz/week  . 4 Shots of liquor per week    Comment: 12/14/2013 "~ 4oz scotch/wk"     History  Drug Use No    History   Social History  . Marital Status: Widowed    Spouse Name: N/A  . Number of Children: N/A  . Years of Education: N/A   Social History Main Topics  . Smoking status: Former Smoker -- 0.12 packs/day for 2 years    Types: Cigarettes    Quit date: 06/20/1979  . Smokeless tobacco: Never Used  . Alcohol Use: 2.4 oz/week    4 Shots of liquor per week     Comment: 12/14/2013 "~ 4oz scotch/wk"  . Drug Use: No  . Sexual Activity: No   Other Topics Concern  . None   Social History Narrative   Additional Social History:    Pain Medications: SEE MAR Prescriptions: SEE MAR Over the Counter: SEE MAR History of alcohol / drug use?:  (unk; patient not cooperating with assessment; No UDS or BAL as of 07/18/2014 0756)                     Allergies:   Allergies  Allergen Reactions  . Morphine And Related Anxiety    Delirium exacerbated by use of opiates, responds best to fentanyl for acute pain  . Cardura [Doxazosin Mesylate]     Hypotenstion  . Gabapentin     Weight gain    Vitals: Blood pressure 162/95, pulse 71, temperature 97.5 F (36.4 C), temperature source Axillary, resp. rate 18, SpO2 96 %.  Risk to Self: Suicidal Ideation: No Suicidal Intent:  Hydrographic surveyor unable to confirm or deny) Is patient at risk for suicide?: No (unk) Suicidal Plan?:  (Writer unable to confirm or deny) Access to Means:  (unk) What has been your use of drugs/alcohol within the last 12 months?:  (No drug use reported at this time.) How  many times?:  (unk) Other Self Harm Risks:  (unk) Triggers for Past Attempts: Other (Comment) (unk) Intentional Self Injurious Behavior:  (unk) Risk to Others: Homicidal Ideation:  Hydrographic surveyor unable to confirm or deny) Thoughts of Harm to Others:  (unk) Current Homicidal Intent: No (unk) Current Homicidal Plan:  (unk) Access to Homicidal Means:  (unk) Identified Victim:  (unk) History of harm to others?: No (unk) Assessment of Violence:  (n/a) Violent Behavior Description:  (patient calm and cooperative ) Does patient have access to weapons?: No Criminal Charges Pending?: No Does patient have a court date: No Prior Inpatient Therapy: Prior Inpatient Therapy: No (unk) Prior Therapy Dates: unk Prior Therapy Facilty/Provider(s): unk Reason for Treatment: unk  Prior Outpatient Therapy: Prior Outpatient Therapy: No (unk) Prior Therapy Dates: unk Prior Therapy Facilty/Provider(s): unk Reason for Treatment: unk  Current Facility-Administered Medications  Medication Dose Route Frequency Provider Last Rate Last Dose  . acetaminophen (TYLENOL) tablet 1,000 mg  1,000 mg Oral TID Edsel PetrinElizabeth L Golding, DO   1,000 mg at 07/17/14 2127  . ALPRAZolam Prudy Feeler(XANAX) tablet 0.25 mg  0.25 mg Oral BID PRN Elwin MochaBlair Walden, MD   0.25 mg at 07/17/14 1729  . ALPRAZolam Prudy Feeler(XANAX) tablet 1 mg  1 mg Oral QHS Edsel PetrinElizabeth L Golding, DO   1 mg at 07/17/14 2127  . alum & mag hydroxide-simeth (MAALOX/MYLANTA) 200-200-20 MG/5ML suspension 30 mL  30 mL Oral PRN Elwin MochaBlair Walden, MD   30 mL at 07/16/14 2327  . aspirin tablet 325 mg  325 mg Oral Daily Elwin MochaBlair Walden, MD   325 mg at 07/18/14 1036  . atenolol (TENORMIN) tablet 50 mg  50 mg Oral Daily Edsel PetrinElizabeth L Golding, DO   25 mg at 07/17/14 1530  . lactulose (CHRONULAC) 10 GM/15ML solution 10 g  10 g Oral BID Elwin MochaBlair Walden, MD   10 g at 07/18/14 1310  . nicotine (NICODERM CQ - dosed in mg/24 hours) patch 21 mg  21 mg Transdermal Daily Elwin MochaBlair Walden, MD   21 mg at 07/16/14 2213  . ondansetron  (ZOFRAN) tablet 4 mg  4 mg Oral Q8H PRN Elwin MochaBlair Walden, MD      . pantoprazole (PROTONIX) EC tablet 40 mg  40 mg Oral Daily Elwin MochaBlair Walden, MD   40 mg at 07/16/14 2130  . risperiDONE (RISPERDAL M-TABS) disintegrating tablet 1 mg  1 mg Oral BID Edsel PetrinElizabeth L Golding, DO   1 mg at 07/18/14 1310  . tamsulosin (FLOMAX) capsule 0.8 mg  0.8 mg Oral QHS Elwin MochaBlair Walden, MD   0.8 mg at 07/17/14 2127  . thiamine (VITAMIN B-1) tablet 100 mg  100 mg Oral Daily Edsel PetrinElizabeth L Golding, DO   100 mg at 07/18/14 1036   Current Outpatient Prescriptions  Medication Sig Dispense Refill  . ALPRAZolam (XANAX) 0.25 MG tablet Take 1 tablet (0.25 mg total) by mouth 2 (two) times daily as needed for anxiety. 60 tablet 0  . ALPRAZolam (XANAX) 1 MG tablet Take 1-2 tablets (1-2 mg total) by mouth every 6 (six) hours as needed for anxiety or sleep (agitation). (Patient taking differently: Take 1 mg by mouth at bedtime. ) 90 tablet 0  . aspirin 325 MG tablet Take 1 tablet (325 mg total) by mouth daily.    Marland Kitchen. atenolol (TENORMIN) 25 MG tablet Take 1 tablet (25 mg total) by mouth daily. 30 tablet 6  . b complex vitamins tablet Take 1 tablet by mouth daily.    . chlorhexidine (PERIDEX) 0.12 % solution Use as directed 15 mLs in the mouth or throat 2 (two) times daily. 120 mL 3  . folic acid (FOLVITE) 1 MG tablet Take 1 tablet (1 mg total) by mouth daily. 30 tablet 1  . lactulose (CHRONULAC) 10 GM/15ML solution Take 15 mLs (10 g total) by mouth 2 (two) times daily. Stop or decrease for diarrhea 946 mL 0  . pantoprazole (PROTONIX) 40 MG tablet Take 40 mg by mouth daily.    . QUEtiapine (SEROQUEL) 25 MG tablet Take 1 tablet (25 mg total) by mouth at bedtime. (Patient taking differently: Take 50 mg by mouth at bedtime. ) 60 tablet 0  . tamsulosin (FLOMAX) 0.4 MG CAPS capsule Take 2 capsules (0.8 mg total) by mouth  at bedtime. 60 capsule 6  . thiamine 100 MG tablet Take 1 tablet (100 mg total) by mouth daily. 30 tablet 0  . triamcinolone cream  (KENALOG) 0.1 % Apply 1 application topically 2 (two) times daily. To left foot.    Marland Kitchen acetaminophen (TYLENOL) 325 MG tablet Take 2 tablets (650 mg total) by mouth 4 (four) times daily. (Patient not taking: Reported on 07/17/2014)    . Multiple Vitamins-Minerals (MULTIVITAMIN WITH MINERALS) tablet Take 1 tablet by mouth daily.    Marland Kitchen senna-docusate (SENOKOT-S) 8.6-50 MG per tablet Take 1 tablet by mouth at bedtime. (Patient not taking: Reported on 07/17/2014) 30 tablet 0    Musculoskeletal: Strength & Muscle Tone: within normal limits Gait & Station: unsteady Patient leans: N/A  Psychiatric Specialty Exam:     Blood pressure 162/95, pulse 71, temperature 97.5 F (36.4 C), temperature source Axillary, resp. rate 18, SpO2 96 %.There is no weight on file to calculate BMI.  General Appearance: Casual  Eye Contact::  Good  Speech:  Normal Rate  Volume:  Normal  Mood:  Anxious  Affect:  Blunt  Thought Process:  Coherent  Orientation:  To person  Thought Content:  difficult to assess due to state of delirium  Suicidal Thoughts:  No  Homicidal Thoughts:  No  Memory:  Immediate;   Poor Recent;   Poor Remote;   Poor  Judgement:  Impaired  Insight:  Lacking  Psychomotor Activity:  Increased  Concentration:  Poor  Recall:  Poor  Fund of Knowledge:Fair  Language: Fair  Akathisia:  No  Handed:  Right  AIMS (if indicated):     Assets:  Health and safety inspector Housing Leisure Time Resilience Social Support  ADL's:  Impaired  Cognition: Impaired,  Severe  Sleep:      Medical Decision Making: Review of Psycho-Social Stressors (1), Review or order clinical lab tests (1) and Review of Medication Regimen & Side Effects (2)  Treatment Plan Summary: Daily contact with patient to assess and evaluate symptoms and progress in treatment, Medication management and Plan discharge to SNF or Memory Care facility, family declinced gero-psychiatry  Plan:  Supportive therapy provided about  ongoing stressors.   Disposition: Discharge to SNF or Memory Care facility, family declinced gero-psychiatry  UPDATE: Continue seeking placement.   Beau Fanny, FNP-BC 07/18/2014 1:53 PM Patient seen face-to-face for psychiatric evaluation, chart reviewed and case discussed with the physician extender and developed treatment plan. Reviewed the information documented and agree with the treatment plan. Thedore Mins, MD

## 2014-07-18 NOTE — Progress Notes (Signed)
CSW continuing to follow.  CSW received notification from [facility] that an Charity fundraiserN and SW from facility came to evaluate pt and [facility] is not able to offer a bed as they do not feel that they can manage pt care in their memory care unit. [facility] stated that they will notify pt family.  CSW spoke with [facility] this morning and awaiting for facility to evaluate pt.   Per report, [facility] is going to send an Charity fundraiserN to evaluate. CSW contacted [facility] and left message. Awaiting return phone call.  CSW to continue to follow.   Loletta SpecterSuzanna Anndee Connett, MSW, LCSW Clinical Social Work Coverage for International PaperKristen Reed, [place]  [phone]

## 2014-07-18 NOTE — Discharge Instructions (Signed)
Patient needs outpatient Hospice Referral Goals are comfort care No limitations on his care- allow for food and activity as tolerated if he is able.

## 2014-07-18 NOTE — Progress Notes (Signed)
Nurse from [facility] called and informed CSW that the hospital does not have any open beds tonight.   Nurse also stated that the pt would not be appropriate for their hospital due to him having dementia.   Trish MageBrittney Yajayra Feldt, LCSWA [phone] ED CSW 07/18/2014 8:38 PM

## 2014-07-18 NOTE — Progress Notes (Signed)
Patient will be going to [facility] and needs full a Hospice referral. HOTP evaluated and do not feel he is hospice facility appropriate. I have discussed his case with staff at [facility] and I believe that he can be care for well there along with hospice. I have transitioned his medications to sublingual and rapidly dissolving tablets in case he is unable to take PO medication. Again, minimal urine output, noted he was taking sips of liquids and small amounts of food.  His PCP is Suszanne ConnersLiz Kollar, MD -will make her aware of Hospice request.  Anderson MaltaElizabeth Rendon Howell, DO Palliative Medicine [phone]

## 2014-07-18 NOTE — ED Notes (Signed)
Patient taking blankets off multiple times, requesting repeatedly "Bring me my keys so I can drive home". Pt attempting to get OOB multiple times, difficult to redirect at times. PRN Ativan given as ordered.

## 2014-07-18 NOTE — ED Notes (Signed)
Pt ate bites of applesauce, mash potatoes, greens, peaches, and pork chop meat. Pt also took sips of lemonade and water.

## 2014-07-18 NOTE — ED Notes (Signed)
Pt ate a few bites of warm applesauce and drunk 2 sips of apple juice. Pt refused to eat anymore.

## 2014-07-18 NOTE — ED Notes (Signed)
Patient pleasant and cooperative on assessment. No s/s of distress noted. Pt brightens on approach. Sitter at bedside for safety. No complaints voiced.

## 2014-07-18 NOTE — Progress Notes (Signed)
CSW was informed by covering CSW Suzanna that the pt's family does not wish to send the pt to a facility anymore. She stated that the pt was accepted [facility], but the family declined the offer and are now stating that they wish for the pt to be admitted into a geri-psych hospital. Although, yesterday they stated they wanted the pt to be in a facility.  CSW reached out to son Osa Craver(Ashtian Jr) to confirm this information. Son informed CSW that the information above is correct. Therefore, CSW faxed pt out to the following hospitals : [facility], [facility], [facility], [facility], [facility], and [facility].   CSW informed son that a geri-psych hospital would still have to offer the pt a bed in order for him to be admitted.  CSW will continue to follow up with the hospitals mentioned above.  Trish MageBrittney Syeda Prickett, LCSWA [phone] ED CSW 07/18/2014 5:37 PM

## 2014-07-18 NOTE — ED Notes (Signed)
Pt given sips of water. Pt remains cooperative. Sleeps when undisturbed. Pt remains oriented to self. Sitter remains at bedside.

## 2014-07-18 NOTE — ED Notes (Signed)
Sitter at bedside with pt.

## 2014-07-18 NOTE — ED Notes (Signed)
[phone] direct number for Rosalita ChessmanSuzanne with social work

## 2014-07-18 NOTE — ED Notes (Signed)
Pt attempting multiple times to get OOB over side rails, Dr. Juleen ChinaKohut notified. Pt placed in restraints

## 2014-07-18 NOTE — ED Notes (Signed)
Hospice nurse at bedside to evaluate patient.

## 2014-07-18 NOTE — ED Notes (Signed)
Pt found to be resting quietly in bed unrestrained.

## 2014-07-18 NOTE — ED Notes (Signed)
[facility] RN at bedside with pt for assessment.

## 2014-07-18 NOTE — Progress Notes (Addendum)
CSW continuing to follow.   CSW spoke with Dr. Hilma Favors and [facility] RN, Tommye Standard was in ED to evaluate pt. Per Dr. Hilma Favors, [facility] Total Care with Hospice would be a back up plan if [facility] unable to accept pt. [facility] agreed with this plan if [facility] unable to accept.    CSW received notification from [facility] liaison, Orbie Pyo who stated that pt was evaluated at bedside and pt does not meet criteria for residential hospice at this time.   CSW contacted Dr. Hilma Favors to notify and Dr. Hilma Favors recommends to move forward with plan for [facility] memory care with full hospice services.   CSW contacted [employer] and left message for RN, Leeroy Cha. CSW contacted Leisure centre manager, Dorian Pod and spoke with him regarding that pt had been evaluated by RN, Leeroy Cha earlier and notified Business Sales executive of [facility] that [facility] was unable to accept and family wants to accept bed offer for pt to go there with hospice following. Per Dorian Pod, he will contact the RN and Tree surgeon in order for them to contact this CSW to arrange admission. Awaiting return phone call.   Addendum 2: 43 pm:   CSW continuing to follow. CSW was in process of arranging placement for pt at [facility] with hospice services as this was agreed upon from discussion by pt son, Cletus Gash earlier in the day. [facility] was willing to accept pt to their memory care unit with hospice involvement.   CSW then received phone call from pt son, Dakin who stated that he and his brother, Cletus Gash had discussion and they do not feel that pt need can be met at [facility] memory care unit and want to explore option of inpatient geri-psych. CSW discussed with pt son that inpatient geri-psych would only be a short term stay and discussed that if pt is accepted to a  geri-psych facility then the facility would assist with disposition from their facility once pt completes stay. Pt son, Kweku expressed understanding and CSW clarified pt son, Emanuele's questions.   CSW notified TTS that pt sons now requesting Inpatient Geri-Psych referrals. CSW will provide report to 2nd shift ED social worker, [place] in order for her to follow up with TTS regarding referrals and follow up with pt son's with updates.   ED second shift CSW, [place] will begin following pt case at 3:30 pm. Please contact her with any questions, [phone]    Alison Murray, MSW, [place] Work (Coverage for Belia Heman, [place])

## 2014-07-18 NOTE — Progress Notes (Signed)
Palliative Medicine Team Progress Note  [name] has not improved. Appears to be terminal agitation. He required a dose of Geodon last night around midnight and has also received Xanax and Risperdal. He is sedated now and calm-when his sedation wears off he becomes very agitated-he responds to soft low voices and reassurance. Minimal urine output. Minimal PO intake.  Awaiting hospice evaluation for inpatient hospice care this AM. Back up plan is [facility]-if we need to go in that direction I will work on getting his medications transitioned to sublingual- not sure he is going to be able to swallow pills successfully at this point. Based on my assessment this morning his prognosis could be less than 2 weeks.  I will follow closely.   Jesus MaltaElizabeth Brace Welte, DO Palliative Medicine [phone]

## 2014-07-18 NOTE — Progress Notes (Signed)
CSW reached out to [facility] who also states that two nurses from their facility will come and evaluate the pt tomorrow to see if he is appropriate for their memory care unit.  CSW spoke with pt's son who states that it is okay for [facility] and [facility] to come and evaluate the pt tomorrow.   Trish MageBrittney Gianmarco Roye, LCSWA [phone] ED CSW 07/18/2014 12:35 AM

## 2014-07-19 ENCOUNTER — Encounter (HOSPITAL_COMMUNITY): Payer: Self-pay | Admitting: Registered Nurse

## 2014-07-19 DIAGNOSIS — R451 Restlessness and agitation: Secondary | ICD-10-CM

## 2014-07-19 NOTE — Progress Notes (Signed)
Per further discussion with family, family now interested in gero psych placement. Patient recommended for inpatient geri psych by psychiatrist and NP. Patient then to be considered at assisted livings once medications are adjusted. Patient has been preliminary accepted at [facility] in [place], [place].   Jesus HesselbachKristen Elaysia Devargas, LCSW [phone]  ED CSW 07/19/2014 9:12 AM

## 2014-07-19 NOTE — ED Notes (Signed)
[name] (son) would like to be contacted by psych MD regarding patients plan for medication and behavior problems. [phone]

## 2014-07-19 NOTE — Progress Notes (Signed)
CSW reached out to son/Prynce. CSW informed son that there has been no bed offers for pt at this time. CSW informed son that he will be updated on the status of the pt tomorrow regarding if a placement had been found or not. CSW informed son that he could call EDCSW phone back if he has any further questions.  Trish MageBrittney Jagger Demonte, LCSWA [phone] ED CSW 07/19/2014 8:24 PM

## 2014-07-19 NOTE — Consult Note (Signed)
[facility] Face-to-Face Psychiatry Consult   Reason for Consult:  Agitation Referring Physician:  EDP Patient Identification: Jesus [name] MRN:  [id number] Principal Diagnosis: Agitation Diagnosis:   Patient Active Problem List   Diagnosis Date Noted  . Palliative care patient [Z51.5] 07/17/2014  . Vascular dementia of acute onset [F01.50] 06/25/2014  . BPH associated with nocturia [N40.1, R35.1] 06/25/2014  . DNR (do not resuscitate) [Z66] 06/23/2014  . Delirium due to multiple etiologies [F05] 06/21/2014  . Idiopathic peripheral neuropathy [G60.9] 06/21/2014  . Non-sustained ventricular tachycardia [I47.2] 06/17/2014  . Alcohol withdrawal delirium [F10.231] 06/16/2014  . Ventral hernia without obstruction or gangrene [K43.9] 06/09/2014  . Aortic stenosis [I35.0] 07/13/2013  . Coronary artery disease [I25.10]   . Coronary atherosclerosis of native coronary artery [I25.10] 07/05/2013  . Essential hypertension, benign [I10] 07/05/2013  . Hyperlipidemia [E78.5] 07/05/2013  . Onychomycosis [B35.1] 02/11/2013    Total Time spent with patient: 25 minutes  Subjective:   Jesus [name] is a 81 y.o. male patient in hospital related to delirium after hernia surgery.  Patient continues to episodes of agitation and combativeness although this morning patient is pleasant.  Patient states that he has not been sleeping well.  "Never sleep good; but there is nothing you can do for me."     HPI: underlying dementia prior to a major surgery in January for a hernia.  Jesus [name] developed delirium after the anesthesia, morphine, urinary tract infection, and other medications.  Since his discharge he has required 24 hour care with increase in aggression, swinging golf clubs at people.  Remains confused and agitated.     Original suggested that patient be admitted to [facility] but family refused wanting patient to be admitted to skilled nursing facility with memory unit; Once patient was accepted to a  skilled nursing facility family then change mind and wanted patient to be admitted to [facility] psych for treatment.     HPI Elements:   Location:  generalized. Quality:  acute. Severity:  severe. Timing:  constant. Duration:  since January. Context:  operation.  Past Medical History:  Past Medical History  Diagnosis Date  . Aortic stenosis, mild   . Dyslipidemia   . Coronary artery disease     CABG 2005, repeat cath with severe 2 vessel ASCAD with high grade stenosis of LAD, patent LIMA to LAD, patent SVG too diag, mildly atretic but widely patent SVG to RCA and high grade stenosis 90% ostial left circ with left dominant with PDA coming off of the left circ s/p rotational atherectomy and cutting balloon PCI with stent to prox left circ  . Heart murmur dx'd 1948  . History of blood transfusion     "related to OR"  . GERD (gastroesophageal reflux disease)   . Duodenal ulcer 1949  . Arthritis     "fingers" (12/14/2013)  . Cellulitis of foot, left 12/13/2013  . Myocardial infarction     age 63   . Neuromuscular disorder     neuropathy left foot  . Nocturia   . Frequency   . Enlarged prostate   . Ventral hernia   . Cellulitis of foot, left 12/13/2013  . Incisional hernia, without obstruction or gangrene 11/15/2013  . Pain in lower limb 07/15/2013  . Paronychia of great toe of left foot 11/12/2012    Past Surgical History  Procedure Laterality Date  . Prostate biopsy    . Cholecystectomy  1972  . Abdominal exploration surgery    . Cataract  extraction w/ intraocular lens  implant, bilateral Bilateral   . Appendectomy    . Coronary angioplasty with stent placement  12/18/2011    "1"  . Tonsillectomy    . Percutaneous coronary stent intervention (pci-s) N/A 12/18/2011    Procedure: PERCUTANEOUS CORONARY STENT INTERVENTION (PCI-S);  Surgeon: Corky Crafts, MD;  Location: [facility] CATH LAB;  Service: Cardiovascular;  Laterality: N/A;  . Carotid endarterectomy      rt side  . Coronary  artery bypass graft  2003    "CABG X3"  . Ventral hernia repair N/A 06/09/2014    Procedure: LAPAROSCOPIC ASSISTED OPEN VENTRAL HERNIA REPAIR;  Surgeon: Ovidio Kin, MD;  Location: [facility] ORS;  Service: General;  Laterality: N/A;  WITH MESH  . Laparoscopic lysis of adhesions N/A 06/09/2014    Procedure: LAPAROSCOPIC LYSIS OF ADHESIONS;  Surgeon: Ovidio Kin, MD;  Location: [facility] ORS;  Service: General;  Laterality: N/A;   Family History:  Family History  Problem Relation Age of Onset  . Heart attack Father   . Arrhythmia Mother    Social History:  History  Alcohol Use  . 2.4 oz/week  . 4 Shots of liquor per week    Comment: 12/14/2013 "~ 4oz scotch/wk"     History  Drug Use No    History   Social History  . Marital Status: Widowed    Spouse Name: N/A  . Number of Children: N/A  . Years of Education: N/A   Social History Main Topics  . Smoking status: Former Smoker -- 0.12 packs/day for 2 years    Types: Cigarettes    Quit date: 06/20/1979  . Smokeless tobacco: Never Used  . Alcohol Use: 2.4 oz/week    4 Shots of liquor per week     Comment: 12/14/2013 "~ 4oz scotch/wk"  . Drug Use: No  . Sexual Activity: No   Other Topics Concern  . None   Social History Narrative   Additional Social History:    Pain Medications: SEE MAR Prescriptions: SEE MAR Over the Counter: SEE MAR History of alcohol / drug use?:  (unk; patient not cooperating with assessment; No UDS or BAL as of 07/18/2014 0756)                     Allergies:   Allergies  Allergen Reactions  . Morphine And Related Anxiety    Delirium exacerbated by use of opiates, responds best to fentanyl for acute pain  . Cardura [Doxazosin Mesylate]     Hypotenstion  . Gabapentin     Weight gain    Vitals: Blood pressure 141/80, pulse 95, temperature 98 F (36.7 C), temperature source Oral, resp. rate 18, SpO2 96 %.  Risk to Self: Suicidal Ideation: No Suicidal Intent:  Hydrographic surveyor unable to confirm or deny) Is  patient at risk for suicide?: No (unk) Suicidal Plan?:  (Writer unable to confirm or deny) Access to Means:  (unk) What has been your use of drugs/alcohol within the last 12 months?:  (No drug use reported at this time.) How many times?:  (unk) Other Self Harm Risks:  (unk) Triggers for Past Attempts: Other (Comment) (unk) Intentional Self Injurious Behavior:  (unk) Risk to Others: Homicidal Ideation:  Hydrographic surveyor unable to confirm or deny) Thoughts of Harm to Others:  (unk) Current Homicidal Intent: No (unk) Current Homicidal Plan:  (unk) Access to Homicidal Means:  (unk) Identified Victim:  (unk) History of harm to others?: No (unk) Assessment of Violence:  (n/a) Violent  Behavior Description:  (patient calm and cooperative ) Does patient have access to weapons?: No Criminal Charges Pending?: No Does patient have a court date: No Prior Inpatient Therapy: Prior Inpatient Therapy: No (unk) Prior Therapy Dates: unk Prior Therapy Facilty/Provider(s): unk Reason for Treatment: unk Prior Outpatient Therapy: Prior Outpatient Therapy: No (unk) Prior Therapy Dates: unk Prior Therapy Facilty/Provider(s): unk Reason for Treatment: unk  Current Facility-Administered Medications  Medication Dose Route Frequency Provider Last Rate Last Dose  . acetaminophen (TYLENOL) tablet 1,000 mg  1,000 mg Oral TID Edsel PetrinElizabeth L Golding, DO   1,000 mg at 07/19/14 1013  . ALPRAZolam Prudy Feeler(XANAX) tablet 1 mg  1 mg Oral QHS Edsel PetrinElizabeth L Golding, DO   1 mg at 07/18/14 2028  . atenolol (TENORMIN) tablet 50 mg  50 mg Oral Daily Edsel PetrinElizabeth L Golding, DO   50 mg at 07/19/14 1010  . cyanocobalamin ((VITAMIN B-12)) injection 1,000 mcg  1,000 mcg Intramuscular Q30 days Edsel PetrinElizabeth L Golding, DO   1,000 mcg at 07/19/14 1047  . LORazepam (ATIVAN) 2 MG/ML concentrated solution 1 mg  1 mg Oral Q2H PRN Edsel PetrinElizabeth L Golding, DO   1 mg at 07/18/14 2348  . LORazepam (ATIVAN) 2 MG/ML concentrated solution 2 mg  2 mg Oral QHS Edsel PetrinElizabeth L  Golding, DO   2 mg at 07/18/14 2027  . nicotine (NICODERM CQ - dosed in mg/24 hours) patch 14 mg  14 mg Transdermal Daily Edsel PetrinElizabeth L Golding, DO   14 mg at 07/19/14 1031  . OLANZapine zydis (ZYPREXA) disintegrating tablet 10 mg  10 mg Oral QHS Edsel PetrinElizabeth L Golding, DO   10 mg at 07/18/14 2027  . risperiDONE (RISPERDAL M-TABS) disintegrating tablet 1 mg  1 mg Oral BID Edsel PetrinElizabeth L Golding, DO   1 mg at 07/19/14 1012  . senna-docusate (Senokot-S) tablet 2 tablet  2 tablet Oral QHS Edsel PetrinElizabeth L Golding, DO   2 tablet at 07/18/14 2026  . tamsulosin (FLOMAX) capsule 0.8 mg  0.8 mg Oral QHS Elwin MochaBlair Walden, MD   0.8 mg at 07/17/14 2127   Current Outpatient Prescriptions  Medication Sig Dispense Refill  . ALPRAZolam (XANAX) 0.25 MG tablet Take 1 tablet (0.25 mg total) by mouth 2 (two) times daily as needed for anxiety. 60 tablet 0  . ALPRAZolam (XANAX) 1 MG tablet Take 1-2 tablets (1-2 mg total) by mouth every 6 (six) hours as needed for anxiety or sleep (agitation). (Patient taking differently: Take 1 mg by mouth at bedtime. ) 90 tablet 0  . aspirin 325 MG tablet Take 1 tablet (325 mg total) by mouth daily.    Marland Kitchen. atenolol (TENORMIN) 25 MG tablet Take 1 tablet (25 mg total) by mouth daily. 30 tablet 6  . b complex vitamins tablet Take 1 tablet by mouth daily.    . chlorhexidine (PERIDEX) 0.12 % solution Use as directed 15 mLs in the mouth or throat 2 (two) times daily. 120 mL 3  . folic acid (FOLVITE) 1 MG tablet Take 1 tablet (1 mg total) by mouth daily. 30 tablet 1  . lactulose (CHRONULAC) 10 GM/15ML solution Take 15 mLs (10 g total) by mouth 2 (two) times daily. Stop or decrease for diarrhea 946 mL 0  . pantoprazole (PROTONIX) 40 MG tablet Take 40 mg by mouth daily.    . QUEtiapine (SEROQUEL) 25 MG tablet Take 1 tablet (25 mg total) by mouth at bedtime. (Patient taking differently: Take 50 mg by mouth at bedtime. ) 60 tablet 0  . tamsulosin (FLOMAX) 0.4  MG CAPS capsule Take 2 capsules (0.8 mg total) by  mouth at bedtime. 60 capsule 6  . thiamine 100 MG tablet Take 1 tablet (100 mg total) by mouth daily. 30 tablet 0  . triamcinolone cream (KENALOG) 0.1 % Apply 1 application topically 2 (two) times daily. To left foot.    Marland Kitchen acetaminophen (TYLENOL) 325 MG tablet Take 2 tablets (650 mg total) by mouth 4 (four) times daily. (Patient not taking: Reported on 07/17/2014)    . acetaminophen (TYLENOL) 500 MG tablet Take 2 tablets (1,000 mg total) by mouth 3 (three) times daily. 30 tablet 0  . atenolol (TENORMIN) 50 MG tablet Take 1 tablet (50 mg total) by mouth daily. 30 tablet 3  . cyanocobalamin (,VITAMIN B-12,) 1000 MCG/ML injection Inject 1 mL (1,000 mcg total) into the muscle every 30 (thirty) days. 1 mL 0  . LORazepam (ATIVAN) 2 MG/ML concentrated solution Take 0.5 mLs (1 mg total) by mouth every 2 (two) hours as needed for anxiety, seizure, sedation or sleep. 30 mL 0  . LORazepam (ATIVAN) 2 MG/ML concentrated solution Take 1 mL (2 mg total) by mouth at bedtime. 30 mL 0  . LORazepam (LORAZEPAM INTENSOL) 2 MG/ML concentrated solution Take 1 mL (2 mg total) by mouth at bedtime. 30 mL 0  . Multiple Vitamins-Minerals (MULTIVITAMIN WITH MINERALS) tablet Take 1 tablet by mouth daily.    . nicotine (NICODERM CQ - DOSED IN MG/24 HOURS) 14 mg/24hr patch Place 1 patch (14 mg total) onto the skin daily. 28 patch 0  . OLANZapine zydis (ZYPREXA) 10 MG disintegrating tablet Take 1 tablet (10 mg total) by mouth at bedtime. 30 tablet 3  . risperiDONE (RISPERDAL M-TABS) 1 MG disintegrating tablet Take 1 tablet (1 mg total) by mouth 2 (two) times daily. 60 tablet 3  . senna-docusate (SENOKOT-S) 8.6-50 MG per tablet Take 1 tablet by mouth at bedtime. (Patient not taking: Reported on 07/17/2014) 30 tablet 0  . senna-docusate (SENOKOT-S) 8.6-50 MG per tablet Take 2 tablets by mouth at bedtime.      Musculoskeletal: Strength & Muscle Tone: within normal limits Gait & Station: unsteady Patient leans: N/A  Psychiatric  Specialty Exam:     Blood pressure 141/80, pulse 95, temperature 98 F (36.7 C), temperature source Oral, resp. rate 18, SpO2 96 %.There is no weight on file to calculate BMI.  General Appearance: Casual  Eye Contact::  Fair  Speech:  Normal Rate  Volume:  Normal  Mood:  Anxious  Affect:  Blunt  Thought Process:  Coherent  Orientation:  To person  Thought Content:  continues to be difficult to assess due to state of delirium  Suicidal Thoughts:  No  Homicidal Thoughts:  No  Memory:  Immediate;   Poor Recent;   Poor Remote;   Poor  Judgement:  Impaired  Insight:  Lacking  Psychomotor Activity:  Normal  Concentration:  Poor  Recall:  Poor  Fund of Knowledge:Fair  Language: Fair  Akathisia:  No  Handed:  Right  AIMS (if indicated):     Assets:  Health and safety inspector Housing Leisure Time Resilience Social Support  ADL's:  Impaired  Cognition: Impaired,  Severe  Sleep:      Medical Decision Making: Review of Psycho-Social Stressors (1), Review or order clinical lab tests (1) and Review of Medication Regimen & Side Effects (2)  Treatment Plan Summary: Daily contact with patient to assess and evaluate symptoms and progress in treatment, Medication management and Plan Inpatient treatment at [facility] psych  facility  Plan:  Supportive therapy provided about ongoing stressors.   Disposition: Discharge to SNF or Memory Care facility, family declinced gero-psychiatry  UPDATE: Inpatient treatment at [facility]   Assunta Found, FNP-BC 07/19/2014 11:18 AM    Patient seen face-to-face for psychiatric evaluation, chart reviewed and case discussed with the physician extender and developed treatment plan. Reviewed the information documented and agree with the treatment plan. Thedore Mins, MD

## 2014-07-19 NOTE — Progress Notes (Signed)
Pt re [facility]: [place]  [facility]   [facility]: [facility], [facility],   [place], [place], [facility],

## 2014-07-19 NOTE — Progress Notes (Signed)
Events reviewed and patient's condition noted to be improved. I adjusted his medications yesterday with a palliative approach for symptom management of severe agitation and chronic delirium. He has rapid post-operative cognitve decline that started 05/2014 after elective ventral hernia repair and progressive vascular dementia with psychosis. He is now day three in the ED observation unit-recommend day-night orientation since there are no windows, possible PT evaluation if he is able and continued monitoring for delirium triggers such as dehydration and urinary retention based on families desire to pursue more aggressive intervention in terms of geropsych treatment plan. I have discussed goals of care with the family again- they want comfort and dignity to be the primary goal and feel that intensive geropsych intervention will help with that for whatever time their father has left. I completed a MOST form that elects for Comfort care, no antibiotics for aspiration PNA, no IV fulids or interventions to prolong his life in the event of serious infection or acute illness, they also do not desire repeat hospitalizations after this and feel this is what they need to be reassured that he is in an irreversible state of cognitive decline. Palliative team will sign off for now. If his condition changes or his goals of care change again please do not hesitate to re-consult us to help with a palliative focused transition.  Anderson MaltaElizabeth Alexandro Line, DO Palliative Medicine [phone] (cell)

## 2014-07-19 NOTE — ED Notes (Signed)
Bonita QuinLinda (friend) [phone]

## 2014-07-19 NOTE — ED Notes (Signed)
MD at bedside. Pysch team. 

## 2014-07-20 ENCOUNTER — Telehealth: Payer: Self-pay | Admitting: Internal Medicine

## 2014-07-20 MED ORDER — LORAZEPAM 2 MG/ML PO CONC
1.0000 mg | ORAL | Status: DC
  Administered 2014-07-20 – 2014-07-21 (×3): 1 mg via ORAL
  Filled 2014-07-20 (×3): qty 1

## 2014-07-20 MED ORDER — BISACODYL 10 MG RE SUPP: 10.0000 mg | Freq: Every day | RECTAL | Status: AC | PRN

## 2014-07-20 MED ORDER — BISACODYL 10 MG RE SUPP: 10.0000 mg | Freq: Every day | RECTAL | Status: DC | PRN

## 2014-07-20 MED ORDER — RISPERIDONE 0.5 MG PO TBDP
0.5000 mg | ORAL_TABLET | Freq: Two times a day (BID) | ORAL | Status: DC
  Filled 2014-07-20: qty 1

## 2014-07-20 MED ORDER — FENTANYL 12 MCG/HR TD PT72
12.5000 ug | MEDICATED_PATCH | TRANSDERMAL | Status: DC
Start: 2014-07-20 — End: 2014-07-21
  Administered 2014-07-20: 12.5 ug via TRANSDERMAL
  Filled 2014-07-20 (×2): qty 1

## 2014-07-20 MED ORDER — LORAZEPAM 2 MG/ML PO CONC: 1.0000 mg | ORAL | Status: DC

## 2014-07-20 MED ORDER — TAMSULOSIN HCL 0.4 MG PO CAPS
0.4000 mg | ORAL_CAPSULE | Freq: Every day | ORAL | Status: DC
  Administered 2014-07-20: 0.4 mg via ORAL
  Filled 2014-07-20: qty 1

## 2014-07-20 MED ORDER — LORAZEPAM 2 MG/ML PO CONC: 2.0000 mg | ORAL | Status: DC | PRN

## 2014-07-20 MED ORDER — RISPERIDONE 1 MG PO TBDP
1.0000 mg | ORAL_TABLET | Freq: Two times a day (BID) | ORAL | Status: DC
  Administered 2014-07-20: 1 mg via SUBLINGUAL
  Filled 2014-07-20 (×3): qty 1

## 2014-07-20 MED ORDER — RISPERIDONE 1 MG PO TBDP: 1.0000 mg | ORAL_TABLET | Freq: Two times a day (BID) | ORAL | Status: DC

## 2014-07-20 MED ORDER — SENNOSIDES-DOCUSATE SODIUM 8.6-50 MG PO TABS: 2.0000 | ORAL_TABLET | Freq: Every evening | ORAL | Status: DC | PRN

## 2014-07-20 MED ORDER — HALOPERIDOL LACTATE 2 MG/ML PO CONC: 2.0000 mg | ORAL | Status: DC | PRN

## 2014-07-20 MED ORDER — FENTANYL 12 MCG/HR TD PT72: 12.5000 ug | MEDICATED_PATCH | TRANSDERMAL | Status: AC

## 2014-07-20 MED ORDER — HALOPERIDOL LACTATE 2 MG/ML PO CONC
2.0000 mg | ORAL | Status: DC | PRN
  Administered 2014-07-21: 2 mg via ORAL
  Filled 2014-07-20: qty 1

## 2014-07-20 NOTE — ED Notes (Signed)
Patient aroused, immediately began attempting to climb over bedside rail. Sitter and tech at bedside to ensure patient safety.

## 2014-07-20 NOTE — Progress Notes (Signed)
07/20/14 1416 ED CM contacted ED SW to update her on call from [place] about new medicare, discharge appeals called in by pt's son per [place] [facility] CM, Cala BradfordKimberly, [facility] Verlee Monte(Dora) with "[place]" on the telephone.  CM informed that [place] need to provide CM with a new medicare discharge appeal.  When CM informed [place] that the pt was not INPATIENT and [facility] pending disposition she informed CM she would fax the appeal information to CM and requested it be returned with documentation that the pt is not INPATIENT but is outpatient. CM asked [place] as clarification "[place], did the patient call" CM was informed pt's "son called"

## 2014-07-20 NOTE — Progress Notes (Signed)
Per psychiatry patient is psychiatrically stable for discharge home. Pt recommended for assisted living memory care with hospice following. Patient does not meet criteria for inpatient geri psych placement at this time. CSW informed patient son [name], who directed CSW to speak with Donata DuffNorman Wentz Jr. Who has been handling all the placements. Pt son [name] asking to speak with NP regarding patient medications.   Byrd HesselbachKristen Walta [place], LCSW [phone]  ED CSW 07/20/2014 11:11 AM

## 2014-07-20 NOTE — Telephone Encounter (Signed)
I called and spoke with Chip BoerVicki and confirmed that [name] would be the attending physician.

## 2014-07-20 NOTE — Progress Notes (Signed)
07/20/14 Clinton Gallant, RN, CCM [phone] Updated ED SW, Kristen 1641 ED CM spoke with [facility] ED charge RN, stacy and TCU RN, Eunice Blase to discuss need to have pt home by 1030 on 2/07/01/14 to be seen by hospice RN voiced understanding and will pass on to oncoming nursing staff 1622 Received fax confirmation for documentation sent to [facility] department [phone] after speaking with Asher Muir at 1619 1621 ED CM spoke with Stacy at [facility] [phone] to coordinate pt's arrival to home on 07/21/14 prior to home hospice RN visit scheduled. Discussed DME palliative plan D as requested by Dr Phillips Odor whom CM had on another telephone to consult for questions about DME/home services. Encouraged Hospice staff to contact son, [name] prior to DME delivery this evening. Discussed with Kennyth Arnold that [name] reported his availability would be after 7 pm on 07/20/14, this evening Stacy states delivery of DME can take "four hours"  1610 Spoke with Dr Phillips Odor  1606 Romualdo Bolk of [facility] about need for hospital bed Informed this can be coordinated by hospice services for pt 1556 Cm spoke again with Dr Phillips Odor after she received a call from [place] 1550 Cm called [name] [phone] to discuss pt d/c home this evening He began speaking rapidly in a frustrated tone about pt not being able to be d/c home until 07/21/14 because pt's hospital bed and other DME were returned to [facility] and that he is a Management consultant. I have a client in my chair now and I am doing her hair and I will not be done until after seven o'clock"  [name] inquired about d/c on 07/21/14 vs 07/20/14 He informed Cm prior to disconnecting that he would call Dr Phillips Odor and speak with her   16 CM called and spoke with Vicky at [facility] [phone] to provide referral for pt services. Informed they could offer services to pt on the evening of 07/20/14 Confirmed dx of vascular dementia and  terminal agitation 1537 Cm spoke with Dr Phillips Odor [phone] to get updated on d/c plans and she states plans are for d/c home with hospice on sedation protocol. States family preference [place] hosp[place] in [place] but also [facility] [place] and [facility] are choices discussed by the family. Requested CM called [facility] [place] and/or [facility] to see if start date could be on this evening 07/20/14. Stating pt needing 24 hr supervision.  Confirmed pt to be d/c to his home address [place], not one of son's home Cm encouraged to call [name] first because he is local and Rogerio Boutelle lives in [place]. Confirmed pt has also private in home care staff  [phone] Cm returned a call to [facility] [place] [phone] and spoke with Anitra Lauth to discuss referral for home services Informed by Lafonda Mosses that all referral information is present and Cm does not need to send further referral information She also stated she would be able to get d/c summary, d/c medicine list and any consults from [facility].  Lafonda Mosses informed services start date would be on 07/21/14 1526 Cm called the [facility] [place], was placed on hold for 5 minutes, then disconnected 1429 CM reviewed recent changes in pt d/c plan with ED SW. Family discussed admission to geri psych, pt not meeting geri psych admission status (restraints- SW discussed with Wellsite geologist).  CM strongly encourage coordination  of another family meeting to include [name], hospice staff, SAPPU MD, [facility] CM and SW for d/c planning Contact numbers provided for sons x 2 9warren [phone]; Osa CraverNorman jr. [phone] & Dr [name]

## 2014-07-20 NOTE — Progress Notes (Signed)
CSW discussed with  further with medical director, Dr. [name], and psychiatric team. At this time, patient is requiring restraints at night and continues to try to get out of bed. Patient family has continued to waiver between inpatient geripsych placement and assisted living/skilled nursing facility with hospice following. Pt may be appropriate for geri psych as long as patient and patient family are wanting aggressive treatment. Pt may be appropriate for a facility with continued care of behaviors through psychiatry and possibly hospice. CSW consulted with Dr. [name] who plans to assess patient again and talk with family to determine current hospice eligibility again as geri psych is not able to manage patient if hospice care needed. Dr. [name] also plans to speak with pt family.   Pt was recommended in [facility] for Depakote 125 BID, Nameda XR 7 mg, Seroquel 25 am, 25mg  at lunch, and 50 qhs. CSW to share with psychiatrist recommendations from [facility].   Psychiatrist recommended dc of zyprexa.   Pt to be evaluated by physical therapy and nutrition.   [facility] for inpatient treatment, patient must be out of restraints for 24 hours before patient can be reviewed for treatmnet.   Pt also has to be out of restraints before pt can be considered by snf or alf.   CSW discussed patient with family and [facility] a Therapist, sportspsychiatrist rounding at facility. [facility] also [facility] by [facility].   Byrd HesselbachKristen Khiana Camino, LCSW [phone]  ED CSW 07/20/2014 1:56 PM

## 2014-07-20 NOTE — ED Notes (Signed)
Flomax capsule opened into small amount of applesauce, patient took without difficulty.

## 2014-07-20 NOTE — Progress Notes (Addendum)
Palliative Medicine Team Progress Note  I have again been requested by family to assist with Jesus [name]'s care coordination and symptom management from a palliative perspective-I find him now comfortable and sedated after nurse gave him a PRN dose of Ativan. He most certainly could not participate with PT since he is unresponsive right now nor is he alert enough to take in nutrition. There is no "in between" for Jesus [name]- he either requires complete sedation or he is combative, agitated and uncooperative. He continues to deteriorate here in the ED. He has been on a stretcher for 4 days, he has required restraints every night and has required a sitter- per ED nursing minimal PO intake and sips of fluids. I addressed goals of care again with family and they remain unchanged- comfort dignity and QOL-even if this requires complete sedation. They do not want aggressive medical interventions or to prolong his life in this condition. Our intent is to give him no more or no less than what he needs to be comfortable. If he is able to eat or take fluids these will be offered but not forced. I again believe that Jesus [name] is approaching EOL from rapidly progressive post-operative cognitive decline in the setting of vascular dementia- this is a rare but clinically significant neuro-inflammatory disorder that has a very high associated mortality rate.  Recommendations and Current Plan Agreed to by Family completely:  1. Sedation for Agitation- scheduled an PRN medication 2. Hospice Care at home to ensure complete comfort and access to in home support for agitation and medication adjustments. Goal being to have complete comfort at EOL and an eventual death at home. 3. Family will provide care along with paid in home caregivers  Symptom Management Plan:  1. Scheduled SL Ativan 1 mg every 4 hours with a 2mg  q2 prn breakthrough dose 2. Scheduled Risperidol- RDT dissolved and given sublingual 1mg  BID 3. Haldol 2mg  q4 prn  SL for breakthrough agitation 4. Low dose Duragesic Patch 12.5 mcg for pain-he has not done well with morphine and oxycodone in the past but tolerates Fentanyl without any side effects and it has been helpful for his pain- he had chronic OA pain prior to his surgery in January.  I discontinued all other PO meds.   Palliative Prophylaxis per home hospice team. Bowel regimen ordered. Comfort feeding should continue.   Home with Hospice when services can be arranged.Will need a provider to see at home shortly after arrival-request that PRN doses of Ativan and Haldol be given prior to transport.  I have completed GOLD DNR and MOST FORM. Will complete discharge. A hospice provider must be available to see him teh day of discharge so that he is formally admitted to hospice services and family has access to 24/7 Hospice support.   Time: 2PM-3:30PM 90 minutes Greater than 50%  of this time was spent counseling and coordinating care related to the above assessment and plan.  Anderson MaltaElizabeth Boni Maclellan, DO Palliative Medicine [phone]

## 2014-07-20 NOTE — Consult Note (Signed)
[facility]   Reason for Consult:  Agitation Referring Physician:  EDP Patient Identification: Jesus [name] MRN:  [id number] Principal Diagnosis: Agitation Diagnosis:   Patient Active Problem List   Diagnosis Date Noted  . Palliative care patient [Z51.5] 07/17/2014  . Vascular dementia of acute onset [F01.50] 06/25/2014  . BPH associated with nocturia [N40.1, R35.1] 06/25/2014  . DNR (do not resuscitate) [Z66] 06/23/2014  . Delirium due to multiple etiologies [F05] 06/21/2014  . Idiopathic peripheral neuropathy [G60.9] 06/21/2014  . Non-sustained ventricular tachycardia [I47.2] 06/17/2014  . Alcohol withdrawal delirium [F10.231] 06/16/2014  . Ventral hernia without obstruction or gangrene [K43.9] 06/09/2014  . Aortic stenosis [I35.0] 07/13/2013  . Coronary artery disease [I25.10]   . Coronary atherosclerosis of native coronary artery [I25.10] 07/05/2013  . Essential hypertension, benign [I10] 07/05/2013  . Hyperlipidemia [E78.5] 07/05/2013  . Onychomycosis [B35.1] 02/11/2013    Total Time spent with patient: 25 minutes  Subjective:   Jesus [name] is a 81 y.o. male patient in hospital related to delirium after hernia surgery.  Patient continues to episodes of agitation and combativeness although this morning patient is pleasant.  Patient states that he has not been sleeping well.  "Never sleep good; but there is nothing you can do for me."     HPI: underlying dementia prior to a major surgery in January for a hernia.  Jesus [name] developed delirium after the anesthesia, morphine, urinary tract infection, and other medications.  Since his discharge he has required 24 hour care with increase in aggression, swinging golf clubs at people.  Remains confused and agitated.     [facility] but family refused wanting patient to be admitted to skilled nursing facility with memory unit; Once patient was accepted to a  skilled nursing facility family then change mind and wanted [facility].    Patient have not been accepted by [facility].  Patient gets agitated at times needing restraint which he needed last night for several attempts to get out of bed.  Patient is being discharged by Dr Renette ButtersGolden at this time.   HPI Elements:   Location:  generalized. Quality:  acute. Severity:  severe. Timing:  constant. Duration:  since January. Context:  operation.  Past Medical History:  Past Medical History  Diagnosis Date  . Aortic stenosis, mild   . Dyslipidemia   . Coronary artery disease     CABG 2005, repeat cath with severe 2 vessel ASCAD with high grade stenosis of LAD, patent LIMA to LAD, patent SVG too diag, mildly atretic but widely patent SVG to RCA and high grade stenosis 90% ostial left circ with left dominant with PDA coming off of the left circ s/p rotational atherectomy and cutting balloon PCI with stent to prox left circ  . Heart murmur dx'd 1948  . History of blood transfusion     "related to OR"  . GERD (gastroesophageal reflux disease)   . Duodenal ulcer 1949  . Arthritis     "fingers" (12/14/2013)  . Cellulitis of foot, left 12/13/2013  . Myocardial infarction     age 81   . Neuromuscular disorder     neuropathy left foot  . Nocturia   . Frequency   . Enlarged prostate   . Ventral hernia   . Cellulitis of foot, left 12/13/2013  . Incisional hernia, without obstruction or gangrene 11/15/2013  . Pain in lower limb 07/15/2013  .  Paronychia of great toe of left foot 11/12/2012    Past Surgical History  Procedure Laterality Date  . Prostate biopsy    . Cholecystectomy  1972  . Abdominal exploration surgery    . Cataract extraction w/ intraocular lens  implant, bilateral Bilateral   . Appendectomy    . Coronary angioplasty with stent placement  12/18/2011    "1"  . Tonsillectomy    . Percutaneous coronary stent intervention (pci-s)  N/A 12/18/2011    Procedure: PERCUTANEOUS CORONARY STENT INTERVENTION (PCI-S);  Surgeon: Corky Crafts, MD;  Location: [facility] CATH LAB;  Service: Cardiovascular;  Laterality: N/A;  . Carotid endarterectomy      rt side  . Coronary artery bypass graft  2003    "CABG X3"  . Ventral hernia repair N/A 06/09/2014    Procedure: LAPAROSCOPIC ASSISTED OPEN VENTRAL HERNIA REPAIR;  Surgeon: Ovidio Kin, MD;  Location: [facility] ORS;  Service: General;  Laterality: N/A;  WITH MESH  . Laparoscopic lysis of adhesions N/A 06/09/2014    Procedure: LAPAROSCOPIC LYSIS OF ADHESIONS;  Surgeon: Ovidio Kin, MD;  Location: [facility] ORS;  Service: General;  Laterality: N/A;   Family History:  Family History  Problem Relation Age of Onset  . Heart attack Father   . Arrhythmia Mother    Social History:  History  Alcohol Use  . 2.4 oz/week  . 4 Shots of liquor per week    Comment: 12/14/2013 "~ 4oz scotch/wk"     History  Drug Use No    History   Social History  . Marital Status: Widowed    Spouse Name: N/A  . Number of Children: N/A  . Years of Education: N/A   Social History Main Topics  . Smoking status: Former Smoker -- 0.12 packs/day for 2 years    Types: Cigarettes    Quit date: 06/20/1979  . Smokeless tobacco: Never Used  . Alcohol Use: 2.4 oz/week    4 Shots of liquor per week     Comment: 12/14/2013 "~ 4oz scotch/wk"  . Drug Use: No  . Sexual Activity: No   Other Topics Concern  . None   Social History Narrative   Additional Social History:    Pain Medications: SEE MAR Prescriptions: SEE MAR Over the Counter: SEE MAR History of alcohol / drug use?:  (unk; patient not cooperating with assessment; No UDS or BAL as of 07/18/2014 0756)     Allergies:   Allergies  Allergen Reactions  . Morphine And Related Anxiety    Delirium exacerbated by use of opiates, responds best to fentanyl for acute pain  . Cardura [Doxazosin Mesylate]     Hypotenstion  . Gabapentin     Weight gain     Vitals: Blood pressure 124/84, pulse 67, temperature 98.1 F (36.7 C), temperature source Axillary, resp. rate 24, SpO2 98 %.  Risk to Self: Suicidal Ideation: No Suicidal Intent:  Hydrographic surveyor unable to confirm or deny) Is patient at risk for suicide?: No (unk) Suicidal Plan?:  (Writer unable to confirm or deny) Access to Means:  (unk) What has been your use of drugs/alcohol within the last 12 months?:  (No drug use reported at this time.) How many times?:  (unk) Other Self Harm Risks:  (unk) Triggers for Past Attempts: Other (Comment) (unk) Intentional Self Injurious Behavior:  (unk) Risk to Others: Homicidal Ideation:  Hydrographic surveyor unable to confirm or deny) Thoughts of Harm to Others:  (unk) Current Homicidal Intent: No (unk) Current Homicidal Plan:  (unk)  Access to Homicidal Means:  (unk) Identified Victim:  (unk) History of harm to others?: No (unk) Assessment of Violence:  (n/a) Violent Behavior Description:  (patient calm and cooperative ) Does patient have access to weapons?: No Criminal Charges Pending?: No Does patient have a court date: No Prior Inpatient Therapy: Prior Inpatient Therapy: No (unk) Prior Therapy Dates: unk Prior Therapy Facilty/Provider(s): unk Reason for Treatment: unk Prior Outpatient Therapy: Prior Outpatient Therapy: No (unk) Prior Therapy Dates: unk Prior Therapy Facilty/Provider(s): unk Reason for Treatment: unk  Current Facility-Administered Medications  Medication Dose Route Frequency Provider Last Rate Last Dose  . atenolol (TENORMIN) tablet 50 mg  50 mg Oral Daily Edsel Petrin, DO   50 mg at 07/20/14 1025  . bisacodyl (DULCOLAX) suppository 10 mg  10 mg Rectal Daily PRN Edsel Petrin, DO      . fentaNYL (DURAGESIC - dosed mcg/hr) 12.5 mcg  12.5 mcg Transdermal Q72H Edsel Petrin, DO      . haloperidol (HALDOL) 2 MG/ML solution 2 mg  2 mg Oral Q4H PRN Edsel Petrin, DO      . LORazepam (ATIVAN) 2 MG/ML concentrated  solution 1 mg  1 mg Oral 6 times per day Edsel Petrin, DO      . LORazepam (ATIVAN) 2 MG/ML concentrated solution 2 mg  2 mg Oral Q2H PRN Edsel Petrin, DO      . nicotine (NICODERM CQ - dosed in mg/24 hours) patch 14 mg  14 mg Transdermal Daily Liliana Brentlinger   14 mg at 07/19/14 1031  . risperiDONE (RISPERDAL M-TABS) disintegrating tablet 1 mg  1 mg Sublingual BID Edsel Petrin, DO      . senna-docusate (Senokot-S) tablet 2 tablet  2 tablet Oral QHS PRN Edsel Petrin, DO      . tamsulosin (FLOMAX) capsule 0.4 mg  0.4 mg Oral QHS Edsel Petrin, DO       Current Outpatient Prescriptions  Medication Sig Dispense Refill  . ALPRAZolam (XANAX) 0.25 MG tablet Take 1 tablet (0.25 mg total) by mouth 2 (two) times daily as needed for anxiety. 60 tablet 0  . ALPRAZolam (XANAX) 1 MG tablet Take 1-2 tablets (1-2 mg total) by mouth every 6 (six) hours as needed for anxiety or sleep (agitation). (Patient taking differently: Take 1 mg by mouth at bedtime. ) 90 tablet 0  . aspirin 325 MG tablet Take 1 tablet (325 mg total) by mouth daily.    Marland Kitchen atenolol (TENORMIN) 25 MG tablet Take 1 tablet (25 mg total) by mouth daily. 30 tablet 6  . b complex vitamins tablet Take 1 tablet by mouth daily.    . chlorhexidine (PERIDEX) 0.12 % solution Use as directed 15 mLs in the mouth or throat 2 (two) times daily. 120 mL 3  . folic acid (FOLVITE) 1 MG tablet Take 1 tablet (1 mg total) by mouth daily. 30 tablet 1  . lactulose (CHRONULAC) 10 GM/15ML solution Take 15 mLs (10 g total) by mouth 2 (two) times daily. Stop or decrease for diarrhea 946 mL 0  . pantoprazole (PROTONIX) 40 MG tablet Take 40 mg by mouth daily.    . QUEtiapine (SEROQUEL) 25 MG tablet Take 1 tablet (25 mg total) by mouth at bedtime. (Patient taking differently: Take 50 mg by mouth at bedtime. ) 60 tablet 0  . tamsulosin (FLOMAX) 0.4 MG CAPS capsule Take 2 capsules (0.8 mg total) by mouth at bedtime. 60 capsule 6  . thiamine  100  MG tablet Take 1 tablet (100 mg total) by mouth daily. 30 tablet 0  . triamcinolone cream (KENALOG) 0.1 % Apply 1 application topically 2 (two) times daily. To left foot.    Marland Kitchen acetaminophen (TYLENOL) 325 MG tablet Take 2 tablets (650 mg total) by mouth 4 (four) times daily. (Patient not taking: Reported on 07/17/2014)    . acetaminophen (TYLENOL) 500 MG tablet Take 2 tablets (1,000 mg total) by mouth 3 (three) times daily. 30 tablet 0  . atenolol (TENORMIN) 50 MG tablet Take 1 tablet (50 mg total) by mouth daily. 30 tablet 3  . cyanocobalamin (,VITAMIN B-12,) 1000 MCG/ML injection Inject 1 mL (1,000 mcg total) into the muscle every 30 (thirty) days. 1 mL 0  . LORazepam (ATIVAN) 2 MG/ML concentrated solution Take 0.5 mLs (1 mg total) by mouth every 2 (two) hours as needed for anxiety, seizure, sedation or sleep. 30 mL 0  . LORazepam (ATIVAN) 2 MG/ML concentrated solution Take 1 mL (2 mg total) by mouth at bedtime. 30 mL 0  . LORazepam (LORAZEPAM INTENSOL) 2 MG/ML concentrated solution Take 1 mL (2 mg total) by mouth at bedtime. 30 mL 0  . Multiple Vitamins-Minerals (MULTIVITAMIN WITH MINERALS) tablet Take 1 tablet by mouth daily.    . nicotine (NICODERM CQ - DOSED IN MG/24 HOURS) 14 mg/24hr patch Place 1 patch (14 mg total) onto the skin daily. 28 patch 0  . OLANZapine zydis (ZYPREXA) 10 MG disintegrating tablet Take 1 tablet (10 mg total) by mouth at bedtime. 30 tablet 3  . risperiDONE (RISPERDAL M-TABS) 1 MG disintegrating tablet Take 1 tablet (1 mg total) by mouth 2 (two) times daily. 60 tablet 3  . senna-docusate (SENOKOT-S) 8.6-50 MG per tablet Take 1 tablet by mouth at bedtime. (Patient not taking: Reported on 07/17/2014) 30 tablet 0  . senna-docusate (SENOKOT-S) 8.6-50 MG per tablet Take 2 tablets by mouth at bedtime.      Musculoskeletal: Strength & Muscle Tone: within normal limits Gait & Station: unsteady Patient leans: N/A  Psychiatric Specialty Exam:     Blood pressure 124/84, pulse  67, temperature 98.1 F (36.7 C), temperature source Axillary, resp. rate 24, SpO2 98 %.There is no weight on file to calculate BMI.  General Appearance: Casual  Eye Contact::  Fair  Speech:  Normal Rate  Volume:  Normal  Mood:  Anxious  Affect:  Blunt  Thought Process:  Coherent  Orientation:  To person  Thought Content:  continues to be difficult to assess due to state of delirium  Suicidal Thoughts:  No  Homicidal Thoughts:  No  Memory:  Immediate;   Poor Recent;   Poor Remote;   Poor  Judgement:  Impaired  Insight:  Lacking  Psychomotor Activity:  Normal  Concentration:  Poor  Recall:  Poor  Fund of Knowledge:Fair  Language: Fair  Akathisia:  No  Handed:  Right  AIMS (if indicated):     Assets:  Health and safety inspector Housing Leisure Time Resilience Social Support  ADL's:  Impaired  Cognition: Impaired,  Severe  Sleep:      Medical Decision Making: Established Problem, Stable/Improving (1)  Treatment Plan Summary: Plan Discharge home with Hospice care  Plan:  Supportive therapy provided about ongoing stressors.   Disposition: Patient is being discharged to Hospice care  UPDATE: Patient is being discharged by Dr Renette Butters to hospice care   Earney Navy, PMHNP-BC 07/20/2014 3:43 PM    Patient seen face-to-face for psychiatric evaluation, chart reviewed and  case discussed with the physician extender and developed treatment plan. Reviewed the information documented and agree with the treatment plan. Corena Pilgrim, MD

## 2014-07-20 NOTE — Progress Notes (Signed)
PT Cancellation Note  Patient Details Name: Jesus [name] MRN: [id number] DOB: 01/07/1929   Cancelled Treatment:    Reason Eval/Treat Not Completed: PT screened, no needs identified, will sign off. Per RN and Dr [name], PT eval no longer needed. Will sign off. Thanks.    [name], MPT Pager: [phone]

## 2014-07-20 NOTE — ED Notes (Signed)
Pt's son has called by phone for an update.  He is very concerned about the possibility of the patient being discharged.  He states that 3 different "memory care" facility representatives have come by here to assess the patient and told him that he's not appropriate for those places.  The son states he and his family are unable to care for him at home.   This message has been passed on to the psych team.

## 2014-07-20 NOTE — ED Notes (Signed)
Patient refused to eat his breakfast and 5% of lunch

## 2014-07-20 NOTE — Telephone Encounter (Signed)
Needs a call back as soon as possible in regards to patient.  [facility] but can not until [name] confirms as attending MD to Hospice.

## 2014-07-20 NOTE — ED Notes (Signed)
Vitals  not done for 14:00 Pt asleep per RN order

## 2014-07-20 NOTE — Progress Notes (Signed)
If hospice is unable to do a home admission this evening or provide continuous care until he is comfortable at home I would support a GIP Hospice Admission from the ED- for his comfort and QOL.   Anderson MaltaElizabeth Golding, DO Palliative Medicine

## 2014-07-20 NOTE — Progress Notes (Signed)
CSW discussed case further with Dr. [name]. Per Dr. [name] patient family now requesting home with hospice care. CSW informed RN CM regarding patient disposition. RN CM aware and will follow up with home hospice resources and follow up with pt family. CSW spoke with Donata DuffNorman Gottwald Jr. Who confirmed plan. No further Clinical Social Work needs, signing off.   Byrd HesselbachKristen Truc Winfree, LCSW [phone]  ED CSW 07/20/2014 3:16 PM

## 2014-07-21 DIAGNOSIS — F05 Delirium due to known physiological condition: Secondary | ICD-10-CM

## 2014-07-21 DIAGNOSIS — Z951 Presence of aortocoronary bypass graft: Secondary | ICD-10-CM | POA: Diagnosis not present

## 2014-07-21 DIAGNOSIS — Z87891 Personal history of nicotine dependence: Secondary | ICD-10-CM | POA: Diagnosis not present

## 2014-07-21 DIAGNOSIS — Z7982 Long term (current) use of aspirin: Secondary | ICD-10-CM | POA: Diagnosis not present

## 2014-07-21 DIAGNOSIS — Y939 Activity, unspecified: Secondary | ICD-10-CM | POA: Diagnosis not present

## 2014-07-21 DIAGNOSIS — Z8639 Personal history of other endocrine, nutritional and metabolic disease: Secondary | ICD-10-CM | POA: Diagnosis not present

## 2014-07-21 DIAGNOSIS — K219 Gastro-esophageal reflux disease without esophagitis: Secondary | ICD-10-CM | POA: Diagnosis not present

## 2014-07-21 DIAGNOSIS — Z79899 Other long term (current) drug therapy: Secondary | ICD-10-CM | POA: Diagnosis not present

## 2014-07-21 DIAGNOSIS — I2510 Atherosclerotic heart disease of native coronary artery without angina pectoris: Secondary | ICD-10-CM | POA: Diagnosis not present

## 2014-07-21 DIAGNOSIS — Y998 Other external cause status: Secondary | ICD-10-CM | POA: Diagnosis not present

## 2014-07-21 DIAGNOSIS — I252 Old myocardial infarction: Secondary | ICD-10-CM | POA: Diagnosis not present

## 2014-07-21 DIAGNOSIS — G629 Polyneuropathy, unspecified: Secondary | ICD-10-CM | POA: Diagnosis not present

## 2014-07-21 DIAGNOSIS — S61419A Laceration without foreign body of unspecified hand, initial encounter: Secondary | ICD-10-CM | POA: Diagnosis not present

## 2014-07-21 DIAGNOSIS — R011 Cardiac murmur, unspecified: Secondary | ICD-10-CM | POA: Diagnosis not present

## 2014-07-21 DIAGNOSIS — R4182 Altered mental status, unspecified: Secondary | ICD-10-CM | POA: Diagnosis not present

## 2014-07-21 DIAGNOSIS — R451 Restlessness and agitation: Secondary | ICD-10-CM | POA: Diagnosis not present

## 2014-07-21 DIAGNOSIS — Y92008 Other place in unspecified non-institutional (private) residence as the place of occurrence of the external cause: Secondary | ICD-10-CM | POA: Diagnosis not present

## 2014-07-21 DIAGNOSIS — X789XXA Intentional self-harm by unspecified sharp object, initial encounter: Secondary | ICD-10-CM | POA: Diagnosis not present

## 2014-07-21 DIAGNOSIS — Z872 Personal history of diseases of the skin and subcutaneous tissue: Secondary | ICD-10-CM | POA: Diagnosis not present

## 2014-07-21 DIAGNOSIS — Z9861 Coronary angioplasty status: Secondary | ICD-10-CM | POA: Diagnosis not present

## 2014-07-21 DIAGNOSIS — N400 Enlarged prostate without lower urinary tract symptoms: Secondary | ICD-10-CM | POA: Diagnosis not present

## 2014-07-21 MED ORDER — LORAZEPAM 2 MG/ML PO CONC: 2.0000 mg | ORAL | Status: AC | PRN

## 2014-07-21 MED ORDER — HALOPERIDOL LACTATE 2 MG/ML PO CONC
2.0000 mg | ORAL | Status: DC | PRN
  Filled 2014-07-21: qty 2.5

## 2014-07-21 MED ORDER — LORAZEPAM 2 MG/ML PO CONC: 2.0000 mg | ORAL | Status: AC

## 2014-07-21 MED ORDER — ZIPRASIDONE MESYLATE 20 MG IM SOLR
INTRAMUSCULAR | Status: AC
  Administered 2014-07-21: 10 mg via INTRAMUSCULAR
  Filled 2014-07-21: qty 20

## 2014-07-21 MED ORDER — ZIPRASIDONE MESYLATE 20 MG IM SOLR
10.0000 mg | Freq: Once | INTRAMUSCULAR | Status: AC
  Administered 2014-07-21: 10 mg via INTRAMUSCULAR
  Filled 2014-07-21: qty 20

## 2014-07-21 MED ORDER — LORAZEPAM 2 MG/ML PO CONC: 2.0000 mg | ORAL | Status: DC

## 2014-07-21 MED ORDER — LORAZEPAM 2 MG/ML PO CONC: 2.0000 mg | ORAL | Status: DC | PRN

## 2014-07-21 MED ORDER — HALOPERIDOL LACTATE 2 MG/ML PO CONC: 2.0000 mg | ORAL | Status: AC | PRN

## 2014-07-21 MED ORDER — ZIPRASIDONE MESYLATE 20 MG IM SOLR
10.0000 mg | Freq: Once | INTRAMUSCULAR | Status: AC
  Administered 2014-07-21: 10 mg via INTRAMUSCULAR

## 2014-07-21 MED ORDER — RISPERIDONE 2 MG PO TBDP: 2.0000 mg | ORAL_TABLET | Freq: Two times a day (BID) | ORAL | Status: AC

## 2014-07-21 MED ORDER — RISPERIDONE 2 MG PO TBDP
2.0000 mg | ORAL_TABLET | Freq: Two times a day (BID) | ORAL | Status: DC
  Filled 2014-07-21 (×2): qty 1

## 2014-07-21 NOTE — ED Notes (Signed)
Patient calmer at this time, no longer attempting to get out of bed, no longer attempting to strike staff. Sitter remains at bedside. Will con't to monitor.

## 2014-07-21 NOTE — ED Provider Notes (Signed)
Pt to go home with family on Hospice. Social Work to arrange. Will d/c.  Samuel JesterKathleen Paton Crum, DO 07/21/14 0800

## 2014-07-21 NOTE — Progress Notes (Signed)
Palliative Medicine Team Progress Note  Norm continues to decline. He is losing weight and remains clinically dehydrated. He is stuporous but not agitated. I have discussed his case with [facility] who is available this morning to do an in home admission and were also available last PM but equipment could not be arranged. Discussed plan of care with family-goals are comfort and sedation for agitation. They do not desire any additional hospitalizations or aggressive interventions other than those directed at QOL.   Primary diagnosis: Vascular Dementia, Rapid Cognitive Decline following surgery in 05/2014  Prognosis: <4 weeks if he continues to take minimal PO  Discharge home with Hospice Care.  Patient required Geodon last PM-but was not given any of the PRN OR SCHEDULED medication that I ordered yesterday for his comfort or agitation.  I am hopeful with scheduled dosing of medications for his comfort will help and I feel confident with the support of the Hospice Team he will have dignity as he approaches EOL.  Jesus MaltaElizabeth Chery Giusto, DO Palliative Medicine [phone]

## 2014-07-21 NOTE — Progress Notes (Addendum)
07/21/14 1444 [facility] ED CM spoke with son, Osa Craverorman Jr at the number listed for pt as his home number [phone]. Sharma CovertNorman informed CM this was pt's cell number.  Cm corrected in EPIC.  Reports pt has gotten home okay, has been seen by hospice Rn, is presently resting, everything has been set up and his medications have been filled. ED CM updated ED SW and Dr [name] CM discussed pt concerns with [place], Chiropodistassistant director at [phone]    [facility], transferred to [employer]da voice mail; left a message that services not needed for pt Left Cm number in case Lafonda MossesDiana needed to return a call  0935 Cm spoke with Dr [name], and ED SW.  [facility] nursing director, Minerva Endsesh, unavailable at this time per charge RN and Cm verified by visiting the office. [phone] [facility] with [facility] [phone] ED Cm [facility] to confirm pt DME arrived to his home on 07/21/14 morning. Vicky confirmed [name], son, did not want DME delivery on 07/20/14 evening and wanted to wait until 07/21/14 am.  Confirmed hospice RN scheduled to visit pt 10:30 07/21/14 and [name] will be at the home for pt arrival and hospice RN visit.  CM thanked Vicky for [facility] assistance with the care of this pt.

## 2014-07-21 NOTE — ED Notes (Signed)
Patient increasingly agitated, grabbing at staff and throwing things while staff attempted to clean patient. Patient given PO haldol per PRN order. 3 staff members at bedside at this time to ensure patient safety.

## 2014-07-21 NOTE — Progress Notes (Signed)
Pt to return home with home hospice this morning for [facility]. CSW confirmed with RN who plans to call patient son [name] at 8am, and arrange for ptar.   Byrd HesselbachKristen Tanaya Dunigan, LCSW [phone]  ED CSW 07/21/2014 7:43 AM

## 2014-07-24 NOTE — Telephone Encounter (Signed)
Hospic called in to advise this pt has expried ( passed away)  Jesus CraterKarel [phone]  Date 2/28 Time 5:03

## 2014-07-25 ENCOUNTER — Telehealth: Payer: Self-pay

## 2014-07-25 NOTE — Telephone Encounter (Signed)
Death certificate received 07/25/14 for [name] to sign.  Took upstairs and waiting for signature.  Received back, faxed and [facility].  Await original copy to be mailed.  Received original 07/25/14.  Took upstairs 07/26/14 for [name] to sign.  [facility]

## 2014-07-25 DEATH — deceased

## 2014-07-28 ENCOUNTER — Ambulatory Visit: Payer: Medicare Other | Admitting: Internal Medicine

## 2014-08-04 ENCOUNTER — Other Ambulatory Visit: Payer: Medicare Other

## 2014-09-05 ENCOUNTER — Ambulatory Visit: Payer: Medicare Other | Admitting: Cardiology

## 2014-09-07 ENCOUNTER — Ambulatory Visit: Payer: Medicare Other | Admitting: Cardiology
